# Patient Record
Sex: Female | Born: 1955 | Race: White | Hispanic: No | Marital: Married | Smoking: Former smoker
Health system: Southern US, Community
[De-identification: ages and names within clinical notes are randomized; demographics above are authoritative.]

## PROBLEM LIST (undated history)

## (undated) DIAGNOSIS — M545 Low back pain: Secondary | ICD-10-CM

## (undated) DIAGNOSIS — IMO0002 Reserved for concepts with insufficient information to code with codable children: Secondary | ICD-10-CM

## (undated) DIAGNOSIS — F329 Major depressive disorder, single episode, unspecified: Secondary | ICD-10-CM

## (undated) DIAGNOSIS — M14679 Charcot's joint, unspecified ankle and foot: Secondary | ICD-10-CM

## (undated) DIAGNOSIS — G43909 Migraine, unspecified, not intractable, without status migrainosus: Secondary | ICD-10-CM

## (undated) DIAGNOSIS — M1990 Unspecified osteoarthritis, unspecified site: Secondary | ICD-10-CM

## (undated) DIAGNOSIS — R42 Dizziness and giddiness: Secondary | ICD-10-CM

## (undated) DIAGNOSIS — Z9889 Other specified postprocedural states: Secondary | ICD-10-CM

## (undated) DIAGNOSIS — T8859XA Other complications of anesthesia, initial encounter: Secondary | ICD-10-CM

## (undated) DIAGNOSIS — R112 Nausea with vomiting, unspecified: Secondary | ICD-10-CM

## (undated) DIAGNOSIS — G8929 Other chronic pain: Secondary | ICD-10-CM

## (undated) DIAGNOSIS — K589 Irritable bowel syndrome without diarrhea: Secondary | ICD-10-CM

## (undated) DIAGNOSIS — R5383 Other fatigue: Secondary | ICD-10-CM

## (undated) DIAGNOSIS — M797 Fibromyalgia: Secondary | ICD-10-CM

## (undated) DIAGNOSIS — F32A Depression, unspecified: Secondary | ICD-10-CM

## (undated) DIAGNOSIS — R0989 Other specified symptoms and signs involving the circulatory and respiratory systems: Secondary | ICD-10-CM

## (undated) DIAGNOSIS — R609 Edema, unspecified: Secondary | ICD-10-CM

## (undated) DIAGNOSIS — G709 Myoneural disorder, unspecified: Secondary | ICD-10-CM

## (undated) DIAGNOSIS — T7840XA Allergy, unspecified, initial encounter: Secondary | ICD-10-CM

## (undated) DIAGNOSIS — T4145XA Adverse effect of unspecified anesthetic, initial encounter: Secondary | ICD-10-CM

## (undated) DIAGNOSIS — I8390 Asymptomatic varicose veins of unspecified lower extremity: Secondary | ICD-10-CM

## (undated) DIAGNOSIS — E119 Type 2 diabetes mellitus without complications: Secondary | ICD-10-CM

## (undated) DIAGNOSIS — K219 Gastro-esophageal reflux disease without esophagitis: Secondary | ICD-10-CM

## (undated) HISTORY — PX: EYE SURGERY: SHX253

## (undated) HISTORY — PX: CHOLECYSTECTOMY: SHX55

## (undated) HISTORY — DX: Unspecified osteoarthritis, unspecified site: M19.90

## (undated) HISTORY — PX: BUNIONECTOMY: SHX129

## (undated) HISTORY — DX: Other specified symptoms and signs involving the circulatory and respiratory systems: R09.89

## (undated) HISTORY — DX: Myoneural disorder, unspecified: G70.9

## (undated) HISTORY — DX: Reserved for concepts with insufficient information to code with codable children: IMO0002

## (undated) HISTORY — DX: Other fatigue: R53.83

## (undated) HISTORY — DX: Depression, unspecified: F32.A

## (undated) HISTORY — DX: Irritable bowel syndrome, unspecified: K58.9

## (undated) HISTORY — PX: RETINAL DETACHMENT SURGERY: SHX105

## (undated) HISTORY — DX: Dizziness and giddiness: R42

## (undated) HISTORY — DX: Edema, unspecified: R60.9

## (undated) HISTORY — PX: VARICOSE VEIN SURGERY: SHX832

## (undated) HISTORY — DX: Gastro-esophageal reflux disease without esophagitis: K21.9

## (undated) HISTORY — DX: Major depressive disorder, single episode, unspecified: F32.9

## (undated) HISTORY — DX: Allergy, unspecified, initial encounter: T78.40XA

---

## 1994-06-06 HISTORY — PX: TUBAL LIGATION: SHX77

## 2003-04-30 ENCOUNTER — Emergency Department (HOSPITAL_COMMUNITY): Admission: EM | Admit: 2003-04-30 | Discharge: 2003-05-01 | Payer: Self-pay | Admitting: Emergency Medicine

## 2013-03-14 ENCOUNTER — Ambulatory Visit (INDEPENDENT_AMBULATORY_CARE_PROVIDER_SITE_OTHER): Payer: Medicare Other

## 2013-03-14 ENCOUNTER — Encounter (INDEPENDENT_AMBULATORY_CARE_PROVIDER_SITE_OTHER): Payer: Self-pay

## 2013-03-14 VITALS — BP 154/72 | HR 86 | Temp 98.1°F | Resp 18 | Ht 68.0 in | Wt 211.0 lb

## 2013-03-14 DIAGNOSIS — L97412 Non-pressure chronic ulcer of right heel and midfoot with fat layer exposed: Secondary | ICD-10-CM

## 2013-03-14 DIAGNOSIS — L97409 Non-pressure chronic ulcer of unspecified heel and midfoot with unspecified severity: Secondary | ICD-10-CM

## 2013-03-14 DIAGNOSIS — E1142 Type 2 diabetes mellitus with diabetic polyneuropathy: Secondary | ICD-10-CM

## 2013-03-14 DIAGNOSIS — E1149 Type 2 diabetes mellitus with other diabetic neurological complication: Secondary | ICD-10-CM

## 2013-03-14 DIAGNOSIS — L02619 Cutaneous abscess of unspecified foot: Secondary | ICD-10-CM

## 2013-03-14 NOTE — Patient Instructions (Signed)
Diabetes and Foot Care Diabetes may cause you to have a poor blood supply (circulation) to your legs and feet. Because of this, the skin may be thinner, break easier, and heal more slowly. You also may have nerve damage in your legs and feet causing decreased feeling. You may not notice minor injuries to your feet that could lead to serious problems or infections. Taking care of your feet is one of the most important things you can do for yourself.  HOME CARE INSTRUCTIONS Do not go barefoot. Bare feet are easily injured. Check your feet daily for blisters, cuts, and redness. Wash your feet with warm water (not hot) and mild soap. Pat your feet and between your toes until completely dry. Apply a moisturizing lotion that does not contain alcohol or petroleum jelly to the dry skin on your feet and to dry brittle toenails. Do not put it between your toes. Trim your toenails straight across. Do not dig under them or around the cuticle. Do not cut corns or calluses, or try to remove them with medicine. Wear clean cotton socks or stockings every day. Make sure they are not too tight. Do not wear knee high stockings since they may decrease blood flow to your legs. Wear leather shoes that fit properly and have enough cushioning. To break in new shoes, wear them just a few hours a day to avoid injuring your feet. Wear shoes at all times, even in the house. Do not cross your legs. This may decrease the blood flow to your feet. If you find a minor scrape, cut, or break in the skin on your feet, keep it and the skin around it clean and dry. These areas may be cleansed with mild soap and water. Do not use peroxide, alcohol, iodine or Merthiolate. When you remove an adhesive bandage, be sure not to harm the skin around it. If you have a wound, look at it several times a day to make sure it is healing. Do not use heating pads or hot water bottles. Burns can occur. If you have lost feeling in your feet or legs, you  may not know it is happening until it is too late. Report any cuts, sores or bruises to your caregiver. Do not wait! SEEK MEDICAL CARE IF:  You have an injury that is not healing or you notice redness, numbness, burning, or tingling. Your feet always feel cold. You have pain or cramps in your legs and feet. SEEK IMMEDIATE MEDICAL CARE IF:  There is increasing redness, swelling, or increasing pain in the wound. There is a red line that goes up your leg. Pus is coming from a wound. You develop an unexplained oral temperature above 102 F (38.9 C), or as your caregiver suggests. You notice a bad smell coming from an ulcer or wound. MAKE SURE YOU:  Understand these instructions. Will watch your condition. Will get help right away if you are not doing well or get worse. Document Released: 05/20/2000 Document Revised: 08/15/2011 Document Reviewed: 11/26/2008 [facility] Patient Information 2014 [place], [place]. Skin Ulcer A skin ulcer is an open sore that can be shallow or deep. Skin ulcers sometimes become infected and are difficult to treat. It may be 1 month or longer before real healing progress is made. CAUSES   Injury.  Problems with the veins or arteries.  Diabetes.  Insect bites.  Bedsores.  Inflammatory conditions. SYMPTOMS   Pain, redness, swelling, and tenderness around the ulcer.  Fever.  Bleeding from the ulcer.  Yellow or clear fluid coming from the ulcer. DIAGNOSIS  There are many types of skin ulcers. Any open sores will be examined. Certain tests will be done to determine the kind of ulcer you have. The right treatment depends on the type of ulcer you have. TREATMENT  Treatment is a long-term challenge. It may include:  Wearing an elastic wrap, compression stockings, or gel cast over the ulcer area.  Taking antibiotic medicines or putting antibiotic creams on the affected area if there is an infection. HOME CARE INSTRUCTIONS  Put on your bandages  (dressings), wraps, or casts over the ulcer as directed by your caregiver.  Change all dressings as directed by your caregiver.  Take all medicines as directed by your caregiver.  Keep the affected area clean and dry.  Avoid injuries to the affected area.  Eat a well-balanced, healthy diet that includes plenty of fruit and vegetables.  If you smoke, consider quitting or decreasing the amount of cigarettes you smoke.  Once the ulcer heals, get regular exercise as directed by your caregiver.  Work with your caregiver to make sure your blood pressure, cholesterol, and diabetes are well-controlled.  Keep your skin moisturized. Dry skin can crack and lead to skin ulcers. SEEK IMMEDIATE MEDICAL CARE IF:   Your pain gets worse.  You have swelling, redness, or fluids around the ulcer.  You have chills.  You have a fever. MAKE SURE YOU:   Understand these instructions.  Will watch your condition.  Will get help right away if you are not doing well or get worse. Document Released: 06/30/2004 Document Revised: 08/15/2011 Document Reviewed: 01/07/2011 [facility] Patient Information 2014 [place], [place].
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
## 2013-03-28 ENCOUNTER — Ambulatory Visit (INDEPENDENT_AMBULATORY_CARE_PROVIDER_SITE_OTHER): Payer: Medicare Other

## 2013-03-28 ENCOUNTER — Ambulatory Visit: Payer: Medicare Other

## 2013-03-28 VITALS — BP 180/100 | HR 92 | Temp 98.0°F | Resp 20

## 2013-03-28 DIAGNOSIS — E1149 Type 2 diabetes mellitus with other diabetic neurological complication: Secondary | ICD-10-CM

## 2013-03-28 DIAGNOSIS — L97409 Non-pressure chronic ulcer of unspecified heel and midfoot with unspecified severity: Secondary | ICD-10-CM

## 2013-03-28 DIAGNOSIS — L97412 Non-pressure chronic ulcer of right heel and midfoot with fat layer exposed: Secondary | ICD-10-CM

## 2013-03-28 DIAGNOSIS — E1142 Type 2 diabetes mellitus with diabetic polyneuropathy: Secondary | ICD-10-CM

## 2013-03-28 NOTE — Progress Notes (Signed)
  Subjective:    Patient ID: Leisure centre manager, female    DOB: 1955/11/02, 57 y.o.   MRN: [id number] "It's getting better, not there yet but better than it was.  It's still draining."  HPI Patient needs to ulceration sub-midfoot right. Still proximally 1 x 1.5 cm overall diameter with surrounding macerated keratoses   Review of Systems deferred     Objective:   Physical Exam No changes in medication her health history. Vascular status unchanged dorsalis pedis +2/4 bilateral PT pulses nonpalpable +1 edema bilateral. To break sensations diminished on Semmes Weinstein testing. Mild serous drainage noted in the ulcer site no ascending cellulitis or purulence. Patient is afebrile the ulcer appears to be improving steadily. At this time the ulcer is debrided Iodosorb and gauze dressing are applied       Assessment & Plan:  Improving diabetic neuropathic ulceration plantar right midfoot dispensed and Curasol hydrogel dressing material. Patient has been using bacitracin Clent Ridges it between Curasol bacitracin as needed. Offload and reduce activities as much as possible. Recheck in 2 weeks for further followup  Alvan Dame DPM

## 2013-03-28 NOTE — Patient Instructions (Signed)
Instructions for Wound Care  The most important step to healing a foot wound is to reduce the pressure on your foot - it is extremely important to stay off your foot as much as possible and wear the shoe/boot as instructed.  Cleanse your foot with saline wash or warm soapy water (dial antibacterial soap or similar).  Blot dry.  Apply prescribed medication to your wound and cover with gauze and a bandage.  May hold bandage in place with Coban (self sticky wrap), Ace bandage or tape.  You may find dressing supplies at your local [employer], [employer], drug store or medical supply store.  Your prescribed topical medication is :  Amerigel (twice daily)  [facility] [employer] is a mail order medical supply company that we use to provide some of our would care products.  If we use their service of you, you will receive the product by mail.  If you have not received the medication in 3 business days, please call our office.  If you notice any foul odor, increase in pain, pus, increased swelling, red streaks or generalized redness occurring in your foot or leg-Call our office immediately to be seen.  This may be a sign of a limb or life threatening infection that will need prompt attention.  Alvan Dame, [facility]  [facility]  [phone] [place]  [phone] [facility]

## 2013-04-11 ENCOUNTER — Ambulatory Visit (INDEPENDENT_AMBULATORY_CARE_PROVIDER_SITE_OTHER): Payer: Medicare Other

## 2013-04-11 VITALS — BP 113/74 | HR 96 | Temp 97.9°F | Resp 16

## 2013-04-11 DIAGNOSIS — L97412 Non-pressure chronic ulcer of right heel and midfoot with fat layer exposed: Secondary | ICD-10-CM

## 2013-04-11 DIAGNOSIS — L97409 Non-pressure chronic ulcer of unspecified heel and midfoot with unspecified severity: Secondary | ICD-10-CM

## 2013-04-11 DIAGNOSIS — L02619 Cutaneous abscess of unspecified foot: Secondary | ICD-10-CM

## 2013-04-11 DIAGNOSIS — E1142 Type 2 diabetes mellitus with diabetic polyneuropathy: Secondary | ICD-10-CM

## 2013-04-11 DIAGNOSIS — M86179 Other acute osteomyelitis, unspecified ankle and foot: Secondary | ICD-10-CM

## 2013-04-11 DIAGNOSIS — E1149 Type 2 diabetes mellitus with other diabetic neurological complication: Secondary | ICD-10-CM

## 2013-04-11 MED ORDER — "KENDALL HYDROGEL GAUZE 2""X2"" EX PADS": 1.0000 | MEDICATED_PAD | Freq: Every morning | CUTANEOUS | Status: DC

## 2013-04-11 MED ORDER — CIPROFLOXACIN HCL 500 MG PO TABS: 500.0000 mg | ORAL_TABLET | Freq: Two times a day (BID) | ORAL | Status: DC

## 2013-04-11 NOTE — Progress Notes (Signed)
Subjective:    Patient ID: Leisure centre manager, female    DOB: February 09, 1956, 57 y.o.   MRN: [id number]  HPI patient went to ER yesterday due to redness and calf of leg was hot and sore and spasms and cramps and they did bloodwork and ultrasound and CT scan and x-ray and I was there for about 7 hours and they didn't see anything but did state to do a MRI    Review of Systems  Constitutional: Positive for chills.  Respiratory: Negative.   Skin: Positive for wound.  Hematological: Negative.   All other systems reviewed and are negative.       Objective:   Physical Exam  Vitals reviewed. Constitutional: She is oriented to person, place, and time. She appears well-developed and well-nourished.  Cardiovascular:  Pulses:      Dorsalis pedis pulses are 1+ on the right side, and 2+ on the left side.       Posterior tibial pulses are 1+ on the right side, and 1+ on the left side.  Capillary refill time 3 seconds all digits. There is significant edema and erythema the right foot mid arch area with some possible mild cellulitis around the plantar ulcer. Definite increase in warmth from previous visit. Patient did have some calf pain was seen in the hospital your did have some studies which were noted to be negative, apparently ultrasounds were done and be negative  Musculoskeletal:  Patient does have rigid contractures of toes edema enlargement right foot with Charcot-like changes midtarsal foot right. There is open ulceration sub-midfoot  Neurological: She is alert and oriented to person, place, and time. She has normal strength.  Epicritic and proprioceptive sensations grossly diminished on [facility] testing evaluation. Normal plantar response is noted DTRs absent on the Achilles tendon.  Skin: Skin is warm and dry. No cyanosis. Nails show no clubbing.  Skin color pigment normal there is erythema right foot and leg with localized cellulitis and ulcer proximally 2 x 3 cm sub-mid tarsus right  foot the ulcer goes down to deep capsule possibly even to bone at the area of Lisfranc's mid tarsus area. Hospital had taken x-rays which were noted to be unremarkable there was recommendation for MRI followup.  Psychiatric: She has a normal mood and affect. Her behavior is normal.          Assessment & Plan:  Diabetes with peripheral neuropathy history of ulcerations in the past. Recent ulceration was in treat with clindamycin and last several days return for the worse with fever and chills increased discharge drainage. Patient was seen in the ER the other day was administered IV vancomycin has had improvement in overall feeling however continues to have edema erythema with serous discharge drainage from the plantar right foot ulcer. There is necrosis of the central portion the ulcer which is debrided this time the ulcer does probe down to deep capsule. No new x-rays taken apparently Hospital x-rays were negative for osteomyelitis however recommendation was for MRI followup. Patient also. CT scanning done which was negative. Ulcer is debrided this time Dr. deep capsular structures Iodosorb and gauze dressing is applied to help dry the wound. Prescription for hydrogel dressings is given at this time patient also given prescriptions to start Cipro and will continue with her clindamycin. Clindamycin 150 3 times a day. Cipro 500 twice a day. We'll arrange for an MRI to be done as soon as possible right foot to rule out osteomyelitis. Recheck in one week for followup will  do daily dressing changes as instructed. Also to offload as much as possible. Should make note patient is been going up the bar to take care of her horses and animals. Foot has possibly gotten wet or contaminated leading to infection at last evaluations culture obtained on 03/05/2013 was positive for Serratia marcescens and group A strep patient was maintained on clindamycin. Followup in 1 week from now. Advised to contact us immediately if  any fever chills or occur or any case ascending cellulitis were to recur again next  Alvan Dame DP

## 2013-04-11 NOTE — Patient Instructions (Signed)
Diabetes and Foot Care Diabetes may cause you to have problems because of poor blood supply (circulation) to your feet and legs. This may cause the skin on your feet to become thinner, break easier, and heal more slowly. Your skin may become dry, and the skin may peel and crack. You may also have nerve damage in your legs and feet causing decreased feeling in them. You may not notice minor injuries to your feet that could lead to infections or more serious problems. Taking care of your feet is one of the most important things you can do for yourself.  HOME CARE INSTRUCTIONS  Wear shoes at all times, even in the house. Do not go barefoot. Bare feet are easily injured.  Check your feet daily for blisters, cuts, and redness. If you cannot see the bottom of your feet, use a mirror or ask someone for help.  Wash your feet with warm water (do not use hot water) and mild soap. Then pat your feet and the areas between your toes until they are completely dry. Do not soak your feet as this can dry your skin.  Apply a moisturizing lotion or petroleum jelly (that does not contain alcohol and is unscented) to the skin on your feet and to dry, brittle toenails. Do not apply lotion between your toes.  Trim your toenails straight across. Do not dig under them or around the cuticle. File the edges of your nails with an emery board or nail file.  Do not cut corns or calluses or try to remove them with medicine.  Wear clean socks or stockings every day. Make sure they are not too tight. Do not wear knee-high stockings since they may decrease blood flow to your legs.  Wear shoes that fit properly and have enough cushioning. To break in new shoes, wear them for just a few hours a day. This prevents you from injuring your feet. Always look in your shoes before you put them on to be sure there are no objects inside.  Do not cross your legs. This may decrease the blood flow to your feet.  If you find a minor scrape,  cut, or break in the skin on your feet, keep it and the skin around it clean and dry. These areas may be cleansed with mild soap and water. Do not cleanse the area with peroxide, alcohol, or iodine.  When you remove an adhesive bandage, be sure not to damage the skin around it.  If you have a wound, look at it several times a day to make sure it is healing.  Do not use heating pads or hot water bottles. They may burn your skin. If you have lost feeling in your feet or legs, you may not know it is happening until it is too late.  Make sure your health care provider performs a complete foot exam at least annually or more often if you have foot problems. Report any cuts, sores, or bruises to your health care provider immediately. SEEK MEDICAL CARE IF:   You have an injury that is not healing.  You have cuts or breaks in the skin.  You have an ingrown nail.  You notice redness on your legs or feet.  You feel burning or tingling in your legs or feet.  You have pain or cramps in your legs and feet.  Your legs or feet are numb.  Your feet always feel cold. SEEK IMMEDIATE MEDICAL CARE IF:   There is increasing redness,   swelling, or pain in or around a wound.  There is a red line that goes up your leg.  Pus is coming from a wound.  You develop a fever or as directed by your health care provider.  You notice a bad smell coming from an ulcer or wound. Document Released: 05/20/2000 Document Revised: 01/23/2013 Document Reviewed: 10/30/2012 [employer] Patient Information 2014 [employer]  

## 2013-04-18 ENCOUNTER — Ambulatory Visit (INDEPENDENT_AMBULATORY_CARE_PROVIDER_SITE_OTHER): Payer: Medicare Other

## 2013-04-18 ENCOUNTER — Other Ambulatory Visit: Payer: Self-pay

## 2013-04-18 VITALS — BP 144/82 | HR 89 | Resp 18

## 2013-04-18 DIAGNOSIS — M86179 Other acute osteomyelitis, unspecified ankle and foot: Secondary | ICD-10-CM

## 2013-04-18 DIAGNOSIS — L97409 Non-pressure chronic ulcer of unspecified heel and midfoot with unspecified severity: Secondary | ICD-10-CM

## 2013-04-18 DIAGNOSIS — L97412 Non-pressure chronic ulcer of right heel and midfoot with fat layer exposed: Secondary | ICD-10-CM

## 2013-04-18 DIAGNOSIS — E1142 Type 2 diabetes mellitus with diabetic polyneuropathy: Secondary | ICD-10-CM

## 2013-04-18 DIAGNOSIS — E1149 Type 2 diabetes mellitus with other diabetic neurological complication: Secondary | ICD-10-CM

## 2013-04-18 DIAGNOSIS — L02619 Cutaneous abscess of unspecified foot: Secondary | ICD-10-CM

## 2013-04-18 MED ORDER — CLINDAMYCIN HCL 150 MG PO CAPS: 150.0000 mg | ORAL_CAPSULE | Freq: Three times a day (TID) | ORAL | Status: DC

## 2013-04-18 NOTE — Patient Instructions (Signed)
Diabetes and Foot Care Diabetes may cause you to have problems because of poor blood supply (circulation) to your feet and legs. This may cause the skin on your feet to become thinner, break easier, and heal more slowly. Your skin may become dry, and the skin may peel and crack. You may also have nerve damage in your legs and feet causing decreased feeling in them. You may not notice minor injuries to your feet that could lead to infections or more serious problems. Taking care of your feet is one of the most important things you can do for yourself.  HOME CARE INSTRUCTIONS  Wear shoes at all times, even in the house. Do not go barefoot. Bare feet are easily injured.  Check your feet daily for blisters, cuts, and redness. If you cannot see the bottom of your feet, use a mirror or ask someone for help.  Wash your feet with warm water (do not use hot water) and mild soap. Then pat your feet and the areas between your toes until they are completely dry. Do not soak your feet as this can dry your skin.  Apply a moisturizing lotion or petroleum jelly (that does not contain alcohol and is unscented) to the skin on your feet and to dry, brittle toenails. Do not apply lotion between your toes.  Trim your toenails straight across. Do not dig under them or around the cuticle. File the edges of your nails with an emery board or nail file.  Do not cut corns or calluses or try to remove them with medicine.  Wear clean socks or stockings every day. Make sure they are not too tight. Do not wear knee-high stockings since they may decrease blood flow to your legs.  Wear shoes that fit properly and have enough cushioning. To break in new shoes, wear them for just a few hours a day. This prevents you from injuring your feet. Always look in your shoes before you put them on to be sure there are no objects inside.  Do not cross your legs. This may decrease the blood flow to your feet.  If you find a minor scrape,  cut, or break in the skin on your feet, keep it and the skin around it clean and dry. These areas may be cleansed with mild soap and water. Do not cleanse the area with peroxide, alcohol, or iodine.  When you remove an adhesive bandage, be sure not to damage the skin around it.  If you have a wound, look at it several times a day to make sure it is healing.  Do not use heating pads or hot water bottles. They may burn your skin. If you have lost feeling in your feet or legs, you may not know it is happening until it is too late.  Make sure your health care provider performs a complete foot exam at least annually or more often if you have foot problems. Report any cuts, sores, or bruises to your health care provider immediately. SEEK MEDICAL CARE IF:   You have an injury that is not healing.  You have cuts or breaks in the skin.  You have an ingrown nail.  You notice redness on your legs or feet.  You feel burning or tingling in your legs or feet.  You have pain or cramps in your legs and feet.  Your legs or feet are numb.  Your feet always feel cold. SEEK IMMEDIATE MEDICAL CARE IF:   There is increasing redness,   swelling, or pain in or around a wound.  There is a red line that goes up your leg.  Pus is coming from a wound.  You develop a fever or as directed by your health care provider.  You notice a bad smell coming from an ulcer or wound. Document Released: 05/20/2000 Document Revised: 01/23/2013 Document Reviewed: 10/30/2012 [employer] Patient Information 2014 [employer]  

## 2013-04-18 NOTE — Progress Notes (Signed)
  Subjective:    Patient ID: Leisure centre manager, female    DOB: 10-02-1955, 57 y.o.   MRN: [id number]  HPImy right  foot still drains and my ankle is swollen and the big toe hurts as well Patient presents this time for followup of Charcot-type changes of right foot with ulceration mid tarsus area the right foot. Patient continues to have serous discharge drainage and he had pain and swelling of the foot and right leg. Patient is having an MRI done at noon today. Apparently it was scheduled for earlier this week however nobody called her with her appointment and scheduled time.   Review of Systems  Constitutional: Positive for fatigue.  HENT: Negative.   Respiratory: Negative.   Cardiovascular: Negative.   Gastrointestinal: Negative.   Endocrine: Negative.   Genitourinary: Negative.   Musculoskeletal: Positive for myalgias.  Skin: Negative.   Allergic/Immunologic: Negative.   Neurological: Positive for numbness.  Hematological: Negative.   Psychiatric/Behavioral: Negative.        Objective:   Physical Exam  Vitals reviewed. Constitutional: She is oriented to person, place, and time. She appears well-developed and well-nourished.  Cardiovascular:  Pulses:      Dorsalis pedis pulses are 1+ on the right side, and 2+ on the left side.       Posterior tibial pulses are 1+ on the right side, and 1+ on the left side.  Capillary refill to refill time 3 seconds still significant edema on the right foot and ankle and leg  Musculoskeletal:  No new changes Charcot-type changes of the foot with widening and splaying of the right midfoot and forefoot appear digital contractures and history HAV deformity to  Neurological: She is alert and oriented to person, place, and time. She has normal strength.  Epicritic and proprioceptive sensations grossly diminished DTRs absent bilateral plantar response normal  Skin: Skin is warm and dry. No cyanosis. Nails show no clubbing.  Continued ulceration 2 x 3  cm above mid tarsus of Lisfranc's midtarsal area plantar medial right arch down to deep subcutaneous tissue probes down likely to capsule and not necessary to bone at this time it does go past the subcutaneous tissue level a deep culture swabs obtained at this time  Psychiatric: She has a normal mood and affect. Her behavior is normal.          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 Patient is going to have her MRI done later today to rule out possibility of osteomyelitis or deep abscess. At this time advised to reduce activities as much as possible I still have a concern that she is going outside and working in the barn around her animals. Patient be recheck again in one week for followup contact us is any changes or exacerbations fever or chills. May consider a referral to Dr. Bennett Scrape at [facility] for possible reconstruction options. May also consider referral to the wound center for other treatment options as well. May want to discuss options such as a wound VAC etc. however with patient's activity levels these will likely not be beneficial if she continues to walk on this area. Patient be seen in one week for followup  Alvan Dame DPM

## 2013-04-21 LAB — CULTURE, ROUTINE-ABSCESS

## 2013-04-22 ENCOUNTER — Telehealth: Payer: Self-pay | Admitting: *Deleted

## 2013-04-25 ENCOUNTER — Ambulatory Visit (INDEPENDENT_AMBULATORY_CARE_PROVIDER_SITE_OTHER): Payer: Medicare Other

## 2013-04-25 VITALS — BP 133/76 | HR 92 | Resp 18

## 2013-04-25 DIAGNOSIS — B351 Tinea unguium: Secondary | ICD-10-CM

## 2013-04-25 DIAGNOSIS — L02619 Cutaneous abscess of unspecified foot: Secondary | ICD-10-CM

## 2013-04-25 DIAGNOSIS — E1142 Type 2 diabetes mellitus with diabetic polyneuropathy: Secondary | ICD-10-CM

## 2013-04-25 DIAGNOSIS — L97409 Non-pressure chronic ulcer of unspecified heel and midfoot with unspecified severity: Secondary | ICD-10-CM

## 2013-04-25 DIAGNOSIS — E1149 Type 2 diabetes mellitus with other diabetic neurological complication: Secondary | ICD-10-CM

## 2013-04-25 DIAGNOSIS — M79609 Pain in unspecified limb: Secondary | ICD-10-CM

## 2013-04-25 DIAGNOSIS — L97412 Non-pressure chronic ulcer of right heel and midfoot with fat layer exposed: Secondary | ICD-10-CM

## 2013-04-25 NOTE — Progress Notes (Signed)
  Subjective:    Patient ID: Leisure centre manager, female    DOB: [birth year], 56 y.o.   MRN: [id number]  HPImy foot hurts and also my leg and draining and it is red in color and i just need some of my nails trimmed    Review of Systems no new changes or additions to findings.     Objective:   Physical Exam Neurovascular status intact and unchanged pedal pulses DP and PT plus one over 4 bilateral dorsalis pedis on the left is +2/4 patient Refill time 3 seconds all digits epicritic and proprioceptive sensations significantly diminished on Semmes Weinstein testing and plantar foot and digits. Neurologically has thick brittle crumbly friable criptotic nails with keratosis incurvation patient requesting nail debridement along times a day them trimmed are cutting into her socks and difficulty with the nails patient also has multiple keratoses plantarly which we addressed the future date as well the ulceration sub-mid tarsus area of foot beneath the first met base and cuneiform site continues to be a greater than 1.5 x 1.0 cm in diameter with approximately 1 cm depth. There is necrotic tissue in the base of the ulceration cultures obtained previously were positive forStenotrophomonas multiphilia.. This organism is sensitive to quinolones or Levaquin and likely ciprofloxacin. Patient advised to maintain on her Cipro and clindamycin antibiotic regimen. There is significant changes of the foot with edema and Charcot change of the foot still persistent. No ascending cellulitis noted patient's temperature in the leg and foot. Be more stable at this visit.       Assessment & Plan:  Assessment Charcot changes with ulceration right midfoot. Cultures obtained indicated need to maintain Cipro and clindamycin the ulcer is debrided of necrotic tissue and Iodosorb and gauze dressing are applied patient will maintain hydrogel dressings which were prescribed for her and last visit. Patient will also do an adjuvant therapy  utilizing a day to his type solution or chlorine bleach half a cup pads or gallop water for soaking her cleansing the foot were released once daily or every other day prior to dressing changes. Patient will also try to avoid contact with outside which is taken out or working with her horses she does wear or bruits however this is possibly likely site of contamination. The gram-negative infection is likely associated with a puncture wound a piece of glass a puncture to her foot and shoe. Will continue monitoring recheck in 2 weeks for followup. Contact us immediately or go to the emergency room if any fever chills persist or symptoms exacerbate.  Alvan Dame DP

## 2013-04-25 NOTE — Patient Instructions (Signed)
ANTIBACTERIAL SOAP INSTRUCTIONS  THE DAY AFTER PROCEDURE  Please follow the instructions your doctor has marked.   Shower as usual. Before getting out, place a drop of antibacterial liquid soap (Dial) on a wet, clean washcloth.  Gently wipe washcloth over affected area.  Afterward, rinse the area with warm water.  Blot the area dry with a soft cloth and cover with antibiotic ointment (neosporin, polysporin, bacitracin) and band aid or gauze and tape  Place 3-4 drops of antibacterial liquid soap in a quart of warm tap water.  Submerge foot into water for 20 minutes.  If bandage was applied after your procedure, leave on to allow for easy lift off, then remove and continue with soak for the remaining time.  Next, blot area dry with a soft cloth and cover with a bandage.  Apply other medications as directed by your doctor, such as cortisporin otic solution (eardrops) or neosporin antibiotic ointment. Utilize hydrogel dressings as instructed  Due to soaking her cleansing the foot once daily with a soapy solution however and 1 12:45 half a cup of bleach/chlorine bleach to the 1 gallon of the solution and performed the soak. Rinse thoroughly before doing a redressing as instructed

## 2013-04-26 ENCOUNTER — Telehealth: Payer: Self-pay | Admitting: *Deleted

## 2013-04-26 MED ORDER — CLINDAMYCIN HCL 150 MG PO CAPS: 150.0000 mg | ORAL_CAPSULE | Freq: Three times a day (TID) | ORAL | Status: DC

## 2013-04-26 NOTE — Telephone Encounter (Addendum)
Pt states the pharmacy has two diffterent strengths of Clindamycin 150 mg and 300 mg, which do you want her to fill?  Dr Ralene Cork states refill Clindamycin 150mg .

## 2013-05-09 ENCOUNTER — Inpatient Hospital Stay (HOSPITAL_COMMUNITY)
Admission: EM | Admit: 2013-05-09 | Discharge: 2013-05-13 | DRG: 854 | Disposition: A | Discharge status: Rehabilitation Facility | Payer: Medicare Other | Attending: Internal Medicine | Admitting: Internal Medicine

## 2013-05-09 ENCOUNTER — Ambulatory Visit (INDEPENDENT_AMBULATORY_CARE_PROVIDER_SITE_OTHER): Payer: Medicare Other

## 2013-05-09 ENCOUNTER — Emergency Department (HOSPITAL_COMMUNITY): Payer: Medicare Other

## 2013-05-09 ENCOUNTER — Encounter (HOSPITAL_COMMUNITY): Payer: Self-pay | Admitting: Emergency Medicine

## 2013-05-09 VITALS — BP 96/72 | HR 63 | Temp 101.4°F | Resp 18

## 2013-05-09 DIAGNOSIS — IMO0002 Reserved for concepts with insufficient information to code with codable children: Secondary | ICD-10-CM | POA: Diagnosis present

## 2013-05-09 DIAGNOSIS — M129 Arthropathy, unspecified: Secondary | ICD-10-CM | POA: Diagnosis present

## 2013-05-09 DIAGNOSIS — E1049 Type 1 diabetes mellitus with other diabetic neurological complication: Secondary | ICD-10-CM | POA: Diagnosis present

## 2013-05-09 DIAGNOSIS — Z794 Long term (current) use of insulin: Secondary | ICD-10-CM

## 2013-05-09 DIAGNOSIS — E11628 Type 2 diabetes mellitus with other skin complications: Secondary | ICD-10-CM | POA: Diagnosis present

## 2013-05-09 DIAGNOSIS — M79609 Pain in unspecified limb: Secondary | ICD-10-CM

## 2013-05-09 DIAGNOSIS — L97412 Non-pressure chronic ulcer of right heel and midfoot with fat layer exposed: Secondary | ICD-10-CM

## 2013-05-09 DIAGNOSIS — F32A Depression, unspecified: Secondary | ICD-10-CM

## 2013-05-09 DIAGNOSIS — L97409 Non-pressure chronic ulcer of unspecified heel and midfoot with unspecified severity: Secondary | ICD-10-CM

## 2013-05-09 DIAGNOSIS — E1149 Type 2 diabetes mellitus with other diabetic neurological complication: Secondary | ICD-10-CM

## 2013-05-09 DIAGNOSIS — Z87891 Personal history of nicotine dependence: Secondary | ICD-10-CM

## 2013-05-09 DIAGNOSIS — Z7982 Long term (current) use of aspirin: Secondary | ICD-10-CM

## 2013-05-09 DIAGNOSIS — I798 Other disorders of arteries, arterioles and capillaries in diseases classified elsewhere: Secondary | ICD-10-CM | POA: Diagnosis present

## 2013-05-09 DIAGNOSIS — E1169 Type 2 diabetes mellitus with other specified complication: Secondary | ICD-10-CM

## 2013-05-09 DIAGNOSIS — L97509 Non-pressure chronic ulcer of other part of unspecified foot with unspecified severity: Secondary | ICD-10-CM | POA: Diagnosis present

## 2013-05-09 DIAGNOSIS — F3289 Other specified depressive episodes: Secondary | ICD-10-CM | POA: Diagnosis present

## 2013-05-09 DIAGNOSIS — M869 Osteomyelitis, unspecified: Secondary | ICD-10-CM | POA: Diagnosis present

## 2013-05-09 DIAGNOSIS — E1065 Type 1 diabetes mellitus with hyperglycemia: Secondary | ICD-10-CM | POA: Diagnosis present

## 2013-05-09 DIAGNOSIS — I10 Essential (primary) hypertension: Secondary | ICD-10-CM | POA: Diagnosis present

## 2013-05-09 DIAGNOSIS — L089 Local infection of the skin and subcutaneous tissue, unspecified: Secondary | ICD-10-CM

## 2013-05-09 DIAGNOSIS — F329 Major depressive disorder, single episode, unspecified: Secondary | ICD-10-CM

## 2013-05-09 DIAGNOSIS — Z880 Allergy status to penicillin: Secondary | ICD-10-CM

## 2013-05-09 DIAGNOSIS — M9080 Osteopathy in diseases classified elsewhere, unspecified site: Secondary | ICD-10-CM | POA: Diagnosis present

## 2013-05-09 DIAGNOSIS — L02619 Cutaneous abscess of unspecified foot: Secondary | ICD-10-CM

## 2013-05-09 DIAGNOSIS — E785 Hyperlipidemia, unspecified: Secondary | ICD-10-CM | POA: Diagnosis present

## 2013-05-09 DIAGNOSIS — E1059 Type 1 diabetes mellitus with other circulatory complications: Secondary | ICD-10-CM | POA: Diagnosis present

## 2013-05-09 DIAGNOSIS — L0291 Cutaneous abscess, unspecified: Secondary | ICD-10-CM

## 2013-05-09 DIAGNOSIS — E1142 Type 2 diabetes mellitus with diabetic polyneuropathy: Secondary | ICD-10-CM | POA: Diagnosis present

## 2013-05-09 DIAGNOSIS — K219 Gastro-esophageal reflux disease without esophagitis: Secondary | ICD-10-CM | POA: Diagnosis present

## 2013-05-09 DIAGNOSIS — A419 Sepsis, unspecified organism: Secondary | ICD-10-CM | POA: Diagnosis present

## 2013-05-09 DIAGNOSIS — L0390 Cellulitis, unspecified: Secondary | ICD-10-CM

## 2013-05-09 DIAGNOSIS — E11621 Type 2 diabetes mellitus with foot ulcer: Secondary | ICD-10-CM

## 2013-05-09 HISTORY — DX: Fibromyalgia: M79.7

## 2013-05-09 HISTORY — DX: Adverse effect of unspecified anesthetic, initial encounter: T41.45XA

## 2013-05-09 HISTORY — DX: Other complications of anesthesia, initial encounter: T88.59XA

## 2013-05-09 HISTORY — DX: Asymptomatic varicose veins of unspecified lower extremity: I83.90

## 2013-05-09 HISTORY — DX: Low back pain: M54.5

## 2013-05-09 HISTORY — DX: Nausea with vomiting, unspecified: R11.2

## 2013-05-09 HISTORY — DX: Other chronic pain: G89.29

## 2013-05-09 HISTORY — DX: Type 2 diabetes mellitus without complications: E11.9

## 2013-05-09 HISTORY — DX: Charcot's joint, unspecified ankle and foot: M14.679

## 2013-05-09 HISTORY — DX: Other specified postprocedural states: Z98.890

## 2013-05-09 HISTORY — DX: Migraine, unspecified, not intractable, without status migrainosus: G43.909

## 2013-05-09 LAB — CBC
HCT: 35.8 % — ABNORMAL LOW (ref 36.0–46.0)
Hemoglobin: 12.4 g/dL (ref 12.0–15.0)
MCH: 29.4 pg (ref 26.0–34.0)
MCHC: 34.6 g/dL (ref 30.0–36.0)
MCV: 84.8 fL (ref 78.0–100.0)
Platelets: 211 10*3/uL (ref 150–400)
RBC: 4.22 MIL/uL (ref 3.87–5.11)
RDW: 12.4 % (ref 11.5–15.5)
WBC: 14.8 10*3/uL — ABNORMAL HIGH (ref 4.0–10.5)

## 2013-05-09 LAB — COMPREHENSIVE METABOLIC PANEL
AST: 13 U/L (ref 0–37)
Albumin: 3.2 g/dL — ABNORMAL LOW (ref 3.5–5.2)
BUN: 9 mg/dL (ref 6–23)
Calcium: 8.7 mg/dL (ref 8.4–10.5)
Creatinine, Ser: 0.53 mg/dL (ref 0.50–1.10)
GFR calc Af Amer: >90 mL/min (ref >90)
GFR calc non Af Amer: >90 mL/min (ref >90)
Glucose, Bld: 495 mg/dL — ABNORMAL HIGH (ref 70–99)
Potassium: 4.5 mEq/L (ref 3.5–5.1)
Total Protein: 7.3 g/dL (ref 6.0–8.3)

## 2013-05-09 LAB — GLUCOSE, CAPILLARY: Glucose-Capillary: 357 mg/dL — ABNORMAL HIGH (ref 70–99)

## 2013-05-09 MED ORDER — ONDANSETRON HCL 4 MG/2ML IJ SOLN
4.0000 mg | Freq: Once | INTRAMUSCULAR | Status: AC
  Administered 2013-05-09: 4 mg via INTRAVENOUS
  Filled 2013-05-09: qty 2

## 2013-05-09 MED ORDER — SODIUM CHLORIDE 0.9 % IV SOLN
INTRAVENOUS | Status: AC
  Administered 2013-05-09: 21:00:00 via INTRAVENOUS

## 2013-05-09 MED ORDER — INSULIN GLARGINE 100 UNIT/ML ~~LOC~~ SOLN
24.0000 [IU] | Freq: Every day | SUBCUTANEOUS | Status: DC
  Administered 2013-05-09: 21:00:00 24 [IU] via SUBCUTANEOUS
  Filled 2013-05-09 (×2): qty 0.24

## 2013-05-09 MED ORDER — DIPHENHYDRAMINE HCL 50 MG/ML IJ SOLN
INTRAMUSCULAR | Status: AC
  Administered 2013-05-09: 12.5 mg via INTRAMUSCULAR
  Filled 2013-05-09: qty 1

## 2013-05-09 MED ORDER — ASPIRIN 81 MG PO TABS: 324.0000 mg | ORAL_TABLET | Freq: Every day | ORAL | Status: DC

## 2013-05-09 MED ORDER — INSULIN ASPART 100 UNIT/ML ~~LOC~~ SOLN
0.0000 [IU] | SUBCUTANEOUS | Status: DC
Start: 2013-05-09 — End: 2013-05-11
  Administered 2013-05-09: 21:00:00 15 [IU] via SUBCUTANEOUS
  Administered 2013-05-10: 5 [IU] via SUBCUTANEOUS
  Administered 2013-05-10: 17:00:00 8 [IU] via SUBCUTANEOUS
  Administered 2013-05-10: 05:00:00 3 [IU] via SUBCUTANEOUS
  Administered 2013-05-10: 11 [IU] via SUBCUTANEOUS
  Administered 2013-05-10: 13:00:00 5 [IU] via SUBCUTANEOUS
  Administered 2013-05-10 – 2013-05-11 (×2): 8 [IU] via SUBCUTANEOUS

## 2013-05-09 MED ORDER — DIPHENHYDRAMINE HCL 50 MG/ML IJ SOLN
12.5000 mg | Freq: Once | INTRAMUSCULAR | Status: AC
  Administered 2013-05-09: 12.5 mg via INTRAMUSCULAR

## 2013-05-09 MED ORDER — VANCOMYCIN HCL IN DEXTROSE 1-5 GM/200ML-% IV SOLN
1000.0000 mg | Freq: Three times a day (TID) | INTRAVENOUS | Status: AC
  Administered 2013-05-09 – 2013-05-13 (×12): 1000 mg via INTRAVENOUS
  Filled 2013-05-09 (×11): qty 200

## 2013-05-09 MED ORDER — DIAZEPAM 2 MG PO TABS
2.0000 mg | ORAL_TABLET | Freq: Four times a day (QID) | ORAL | Status: DC | PRN
  Administered 2013-05-10 – 2013-05-12 (×2): 2 mg via ORAL
  Filled 2013-05-09 (×2): qty 1

## 2013-05-09 MED ORDER — MORPHINE SULFATE 4 MG/ML IJ SOLN
4.0000 mg | Freq: Once | INTRAMUSCULAR | Status: AC
  Administered 2013-05-09: 4 mg via INTRAVENOUS
  Filled 2013-05-09: qty 1

## 2013-05-09 MED ORDER — MORPHINE SULFATE 2 MG/ML IJ SOLN: 2.0000 mg | INTRAMUSCULAR | Status: DC | PRN

## 2013-05-09 MED ORDER — ACETAMINOPHEN 325 MG PO TABS
650.0000 mg | ORAL_TABLET | Freq: Once | ORAL | Status: AC
  Administered 2013-05-09: 650 mg via ORAL
  Filled 2013-05-09: qty 2

## 2013-05-09 MED ORDER — ASPIRIN 81 MG PO CHEW
324.0000 mg | CHEWABLE_TABLET | Freq: Every day | ORAL | Status: DC
  Administered 2013-05-10 – 2013-05-13 (×3): 324 mg via ORAL
  Filled 2013-05-09 (×4): qty 4

## 2013-05-09 MED ORDER — VANCOMYCIN HCL IN DEXTROSE 1-5 GM/200ML-% IV SOLN
1000.0000 mg | Freq: Once | INTRAVENOUS | Status: AC
  Administered 2013-05-09: 1000 mg via INTRAVENOUS
  Filled 2013-05-09: qty 200

## 2013-05-09 MED ORDER — DEXTROSE 5 % IV SOLN
1.0000 g | Freq: Three times a day (TID) | INTRAVENOUS | Status: AC
  Administered 2013-05-09 – 2013-05-13 (×10): 1 g via INTRAVENOUS
  Filled 2013-05-09 (×11): qty 1

## 2013-05-09 MED ORDER — HEPARIN SODIUM (PORCINE) 5000 UNIT/ML IJ SOLN
5000.0000 [IU] | Freq: Three times a day (TID) | INTRAMUSCULAR | Status: DC
  Administered 2013-05-09 – 2013-05-13 (×11): 5000 [IU] via SUBCUTANEOUS
  Filled 2013-05-09 (×14): qty 1

## 2013-05-09 MED ORDER — ACETAMINOPHEN 325 MG PO TABS
650.0000 mg | ORAL_TABLET | Freq: Four times a day (QID) | ORAL | Status: DC | PRN
  Administered 2013-05-10 (×2): 650 mg via ORAL
  Filled 2013-05-09 (×2): qty 2

## 2013-05-09 MED ORDER — ACETAMINOPHEN 650 MG RE SUPP: 650.0000 mg | Freq: Four times a day (QID) | RECTAL | Status: DC | PRN

## 2013-05-09 MED ORDER — TRAMADOL HCL 50 MG PO TBDP: 50.0000 mg | ORAL_TABLET | Freq: Four times a day (QID) | ORAL | Status: DC | PRN

## 2013-05-09 MED ORDER — TRAMADOL HCL 50 MG PO TABS
50.0000 mg | ORAL_TABLET | Freq: Four times a day (QID) | ORAL | Status: DC | PRN
  Administered 2013-05-10 (×2): 50 mg via ORAL
  Filled 2013-05-09 (×2): qty 1

## 2013-05-09 MED ORDER — SODIUM CHLORIDE 0.9 % IV BOLUS (SEPSIS)
1000.0000 mL | Freq: Once | INTRAVENOUS | Status: AC
  Administered 2013-05-09: 1000 mL via INTRAVENOUS

## 2013-05-09 NOTE — ED Notes (Signed)
Patient transported to X-ray 

## 2013-05-09 NOTE — Progress Notes (Signed)
   Subjective:    Patient ID: Leisure centre manager, female    DOB: Aug 30, 1955, 57 y.o.   MRN: [id number]  HPI patient presents this time for followup ulceration right foot. On presentation patient is in severe distress patient has severe fever and chills temperature 101.4 her blood pressure somewhat low at 96/72 pulse and respiration are unremarkable. Patient is having severe pain in her right foot and leg. Patient indicates that she's been having fever and chills for the past 4 days. Patient was seen by her primary physician yesterday and cold that she could not be admitted by primary physician. That her podiatrist would have to admit her to the hospital.    Review of Systems deferred at this time. Should note patient is in distress breathing rapidly with fever and chills     Objective:   Physical Exam Vascular status unchanged patient has severe edema and increased temperature the right lower extremity. The previous 1.5 cm ulcer in the central area of the right midfoot has now expanded with surrounding necrosis. There are several areas both proximal and distal that show fluctuance and ecchymosis consistent with likely spreading of abscess and infection along the entire plantar surface. The ulcers been draining purulent and bloody drainage. Patient has been changing dressings 2 or 3 times daily. Patient did not go to the emergency room as instructed because she stated they would just send her home anyways. At this time the wound is cleansed with all cleansed or and a large Kerlix wrap is applied to the right foot patient is already ambulating in a Darco shoe utilizing a cane. Patient has severe pain on palpation and ambulation.       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 At this time patient has been maintained on antibiotic  regimen including Cipro and clindamycin. However the foot has not responded to these medications. Patient states she's not been active in the last couple of days except her visit to Dr. Burgess Estelle. Patient is advised to immediately go to the emergency room at [facility]. Our office a call ahead and notify ER staff of her condition and need for admission. Patient was advised the hospital will likely assign her a hospitalist and possibly orthopedic surgeon for likely need incision and drainage/and possible amputation. Patient was advised there is a high likelihood that she may lose her foot at this time. Patient states she's ready to get it off because it is so painful. Patient left the office accompanied by a friend or family member to drive her to the hospital. Office will contact your in advance of her arrival. We'll followup with ER physician/hospitalist later this evening.  Alvan Dame DPM

## 2013-05-09 NOTE — ED Notes (Signed)
Pt has been seeing PCP for wound in bottom of R foot since September started after she stepped onto a piece of glass. Pt has been on several oral abx and the wound is not getting better. States she has been unable to get her blood sugar under control because of the infection. States her PCP sent her today for further management since the oral abx arent working

## 2013-05-09 NOTE — Progress Notes (Signed)
° °  Subjective:    Patient ID: Leisure centre manager, female    DOB: 1955/09/26, 57 y.o.   MRN: [id number]  HPI Sunday had a fever and took aspirin and tylenol and goes down then it goes right back up and went to my doctor yesterday and there is nothing that she can do and your foot doctor has to put you in hospital and I have not been on my foot at all since Thursday    Review of Systems     Objective:   Physical Exam        Assessment & Plan:

## 2013-05-09 NOTE — Patient Instructions (Signed)
Go immediately to the ER, at [facility].

## 2013-05-09 NOTE — ED Provider Notes (Addendum)
CSN: [id number]     Arrival date & time 05/09/13  1447 History   First MD Initiated Contact with Patient 05/09/13 1632     Chief Complaint  Patient presents with  . Recurrent Skin Infections   (Consider location/radiation/quality/duration/timing/severity/associated sxs/prior Treatment) HPI Comments: Pt presenting today with worsening diabetic foot ulcer.  She initially got the ulcer after stepping on a piece of glass in sept and not improving.  She is currently on cipro/clinda and for the last 1 week she has had worsening swelling, erythema and pain with fever for the last few days of over 101.  She also states her blood sugar has been difficult to control.  The history is provided by the patient.    Past Medical History  Diagnosis Date  . Diabetes mellitus without complication   . Dizziness   . Fatigue   . Poor circulation   . Swelling   . Depression   . Arthritis   . Allergy     sinus problems  . IBS (irritable bowel syndrome)   . Cataract   . GERD (gastroesophageal reflux disease)   . Hyperlipidemia   . Neuromuscular disorder   . Ulcer    Past Surgical History  Procedure Laterality Date  . Cesarean section    . Bunionectomy Right   . Gallbladder surgery    . Eye surgery     Family History  Problem Relation Age of Onset  . Cancer Father    History  Substance Use Topics  . Smoking status: Former Games developer  . Smokeless tobacco: Never Used  . Alcohol Use: No   OB History   Grav Para Term Preterm Abortions TAB SAB Ect Mult Living                 Review of Systems  Constitutional: Positive for fever.  Respiratory: Negative for cough and shortness of breath.   Gastrointestinal: Negative for nausea, vomiting and diarrhea.  Neurological: Negative for weakness.  All other systems reviewed and are negative.    Allergies  Codeine; Darvocet; Demerol; Ibuprofen; Iodine; Naproxen; Penicillins; Sulfa antibiotics; Other; and Tape  Home Medications   Current  Outpatient Rx  Name  Route  Sig  Dispense  Refill  . aspirin 81 MG tablet   Oral   Take 324 mg by mouth daily.         . ciprofloxacin (CIPRO) 500 MG tablet   Oral   Take 1 tablet (500 mg total) by mouth 2 (two) times daily.   20 tablet   2   . clindamycin (CLEOCIN) 150 MG capsule   Oral   Take 150 mg by mouth 3 (three) times daily. 04-26-13 for 30 days.         . diazepam (VALIUM) 2 MG tablet   Oral   Take 2 mg by mouth every 6 (six) hours as needed for anxiety.         . insulin glargine (LANTUS) 100 UNIT/ML injection   Subcutaneous   Inject 24 Units into the skin at bedtime.          . TraMADol HCl 50 MG TBDP   Oral   Take 50 mg by mouth every 6 (six) hours as needed (pain).          BP 146/65  Pulse 104  Temp(Src) 100.1 F (37.8 C) (Oral)  Resp 12  SpO2 95% Physical Exam  Nursing note and vitals reviewed. Constitutional: She is oriented to person, place, and time. She  appears well-developed and well-nourished. No distress.  HENT:  Head: Normocephalic and atraumatic.  Mouth/Throat: Oropharynx is clear and moist.  Eyes: Conjunctivae and EOM are normal. Pupils are equal, round, and reactive to light.  Neck: Normal range of motion. Neck supple.  Cardiovascular: Regular rhythm and intact distal pulses.  Tachycardia present.   No murmur heard. Pulmonary/Chest: Effort normal and breath sounds normal. No respiratory distress. She has no wheezes. She has no rales.  Abdominal: Soft. She exhibits no distension. There is no tenderness. There is no rebound and no guarding.  Musculoskeletal: Normal range of motion. She exhibits no edema and no tenderness.       Feet:  Generalized right foot swelling.  No erythema in the lower leg  Neurological: She is alert and oriented to person, place, and time.  Skin: Skin is warm and dry. No rash noted. No erythema.  Psychiatric: She has a normal mood and affect. Her behavior is normal.    ED Course  Procedures (including  critical care time) Labs Review Labs Reviewed  COMPREHENSIVE METABOLIC PANEL - Abnormal; Notable for the following:    Sodium 131 (*)    Chloride 94 (*)    Glucose, Bld 495 (*)    Albumin 3.2 (*)    All other components within normal limits  CBC - Abnormal; Notable for the following:    WBC 14.8 (*)    HCT 35.8 (*)    All other components within normal limits  GLUCOSE, CAPILLARY - Abnormal; Notable for the following:    Glucose-Capillary 489 (*)    All other components within normal limits   Imaging Review Dg Foot Complete Right  05/09/2013   CLINICAL DATA:  Recurrent skin infection with ulcer on the plantar surface of the right foot.  EXAM: RIGHT FOOT COMPLETE - 3+ VIEW  COMPARISON:  MRI of foot April 18, 2013  FINDINGS: There is no evidence of fracture or dislocation. There are degenerative joint changes of the midfoot. There is plantar calcaneal spur. Postsurgical changes are identified in the distal 1st metatarsal. There are extensive soft tissue swelling and air in the plantar surface of the foot. There is no bony destruction to suggest osteomyelitis.  IMPRESSION: Marked soft tissue swelling and air in the plantar surface of the right foot consistent with infection. No bony destruction to suggest osteomyelitis.   Electronically Signed   By: Sherian Rein M.D.   On: 05/09/2013 18:12    EKG Interpretation   None       MDM   1. Diabetic foot ulcer   2. Cellulitis     Patient presenting with worsening diabetic foot wound.  Patient has been taking antibiotics since September and currently is on clindamycin and Cipro however over the last week her wound has worsened and now she has a new area with erythema and pain on the bottom of her foot. She has a leukocytosis of 14,000 a fever of 101.4 and hyperglycemia. She states she's had a hard time controlling her blood sugars in the last week since the wound has worsened. She seldom care today and they sent her here for further care.  Negative for signs of osteomyelitis. Deep with some foul-smelling drainage but no purulence. Patient started on vancomycin and will admit for further care    Gwyneth Sprout, MD 05/09/13 4098  Gwyneth Sprout, MD 05/09/13 1191

## 2013-05-09 NOTE — Progress Notes (Signed)
Called and received report from [place] in ED.

## 2013-05-09 NOTE — H&P (Signed)
[facility] History and Physical  Nancy Narciso AVW:[id number] DOB: Oct 01, 1955 DOA: 05/09/2013  Referring physician:  PCP: Bufford Spikes, NP  Specialists:   Chief Complaint: Foot ulcer  HPI: [name] [name] is a 57 y.o. female  With a history of uncontrolled diabetes, depression that presents to the emergency department for foot ulcer. Patient states her she had previously he stepped on a piece of glass back in September 2014. She has been seeing wound care. She was placed on Cipro and clindamycin for approximately the last week. However she's noticed that her swelling has worsened as well as her erythema and pain. Patient also had a fever the last few days over 101. She also states her blood sugars have not been well-controlled, and have been ranging in the 200s. Patient is extremely reluctant on using insulin was recently started on Lantus by her primary care practitioner.  Review of Systems:  Constitutional: Denies  chills, diaphoresis, appetite change and fatigue.  complains of fever. HEENT: Denies photophobia, eye pain, redness, hearing loss, ear pain, congestion, sore throat, rhinorrhea, sneezing, mouth sores, trouble swallowing, neck pain, neck stiffness and tinnitus.   Respiratory: Denies SOB, DOE, cough, chest tightness,  and wheezing.   Cardiovascular: Denies chest pain, palpitations and leg swelling.  Gastrointestinal: Denies nausea, vomiting, abdominal pain, diarrhea, constipation, blood in stool and abdominal distention.  Genitourinary: Denies dysuria, urgency, frequency, hematuria, flank pain and difficulty urinating.  Musculoskeletal: Denies myalgias, back pain, joint swelling, arthralgias and gait problem.  Skin: Denies pallor, rash. Wounds on the right foot. Neurological: Denies dizziness, seizures, syncope, weakness, light-headedness, numbness and headaches.  Hematological: Denies adenopathy. Easy bruising, personal or family bleeding history   Psychiatric/Behavioral: Denies suicidal ideation, mood changes, confusion, nervousness, sleep disturbance and agitation  Past Medical History  Diagnosis Date  . Diabetes mellitus without complication   . Dizziness   . Fatigue   . Poor circulation   . Swelling   . Depression   . Arthritis   . Allergy     sinus problems  . IBS (irritable bowel syndrome)   . Cataract   . GERD (gastroesophageal reflux disease)   . Hyperlipidemia   . Neuromuscular disorder   . Ulcer    Past Surgical History  Procedure Laterality Date  . Cesarean section    . Bunionectomy Right   . Gallbladder surgery    . Eye surgery     Social History:  reports that she has quit smoking. She has never used smokeless tobacco. She reports that she does not drink alcohol or use illicit drugs.  married lives at home with her husband.   Allergies  Allergen Reactions  . Codeine Anaphylaxis and Hives  . Darvocet [Propoxyphene-Acetaminophen] Anaphylaxis  . Demerol [Meperidine] Anaphylaxis  . Ibuprofen Anaphylaxis  . Iodine Anaphylaxis  . Naproxen Anaphylaxis and Hives  . Penicillins Anaphylaxis  . Sulfa Antibiotics Nausea And Vomiting  . Other     Bee stings and medication in the cain family  . Tape     Adhesive tapes    Family History  Problem Relation Age of Onset  . Cancer Father     Prior to Admission medications   Medication Sig Start Date End Date Taking? Authorizing Provider  aspirin 81 MG tablet Take 324 mg by mouth daily.   Yes Historical Provider, MD  ciprofloxacin (CIPRO) 500 MG tablet Take 1 tablet (500 mg total) by mouth 2 (two) times daily. 04/11/13  Yes Richard Ralene Cork, DPM  clindamycin (CLEOCIN) 150 MG capsule  Take 150 mg by mouth 3 (three) times daily. 04-26-13 for 30 days. 04/26/13  Yes Richard Ralene Cork, DPM  diazepam (VALIUM) 2 MG tablet Take 2 mg by mouth every 6 (six) hours as needed for anxiety.   Yes Historical Provider, MD  insulin glargine (LANTUS) 100 UNIT/ML injection Inject 24  Units into the skin at bedtime.    Yes Historical Provider, MD  TraMADol HCl 50 MG TBDP Take 50 mg by mouth every 6 (six) hours as needed (pain).   Yes Historical Provider, MD   Physical Exam: Filed Vitals:   05/09/13 1817  BP:   Pulse:   Temp: 100.1 F (37.8 C)  Resp:      General: Well developed, well nourished, NAD, appears stated age  HEENT: NCAT, PERRLA, EOMI, Anicteic Sclera, mucous membranes moist. No pharyngeal erythema or exudates  Neck: Supple, no JVD, no masses,  Cardiovascular: S1 S2 auscultated, 2/6 SEM. Regular rate and rhythm.  Respiratory: Clear to auscultation bilaterally with equal chest rise  Abdomen: Soft, nontender, nondistended, + bowel sounds  Extremities: warm dry without cyanosis clubbing.  2 ulcers noted on the medial and dorsal aspect of the right foot. Non-purulent drainage. Erythema and swelling noted.  Neuro: AAOx3, cranial nerves grossly intact.   Skin: Without rashes exudates or nodules  Psych: Depressed mood and affect, anxiety  Labs on Admission:  Basic Metabolic Panel:  Recent Labs Lab 05/09/13 1517  NA 131*  K 4.5  CL 94*  CO2 25  GLUCOSE 495*  BUN 9  CREATININE 0.53  CALCIUM 8.7   Liver Function Tests:  Recent Labs Lab 05/09/13 1517  AST 13  ALT 8  ALKPHOS 85  BILITOT 0.7  PROT 7.3  ALBUMIN 3.2*   No results found for this basename: LIPASE, AMYLASE,  in the last 168 hours No results found for this basename: AMMONIA,  in the last 168 hours CBC:  Recent Labs Lab 05/09/13 1517  WBC 14.8*  HGB 12.4  HCT 35.8*  MCV 84.8  PLT 211   Cardiac Enzymes: No results found for this basename: CKTOTAL, CKMB, CKMBINDEX, TROPONINI,  in the last 168 hours  BNP (last 3 results) No results found for this basename: PROBNP,  in the last 8760 hours CBG:  Recent Labs Lab 05/09/13 1516  GLUCAP 489*    Radiological Exams on Admission: Dg Foot Complete Right  05/09/2013   CLINICAL DATA:  Recurrent skin infection with  ulcer on the plantar surface of the right foot.  EXAM: RIGHT FOOT COMPLETE - 3+ VIEW  COMPARISON:  MRI of foot April 18, 2013  FINDINGS: There is no evidence of fracture or dislocation. There are degenerative joint changes of the midfoot. There is plantar calcaneal spur. Postsurgical changes are identified in the distal 1st metatarsal. There are extensive soft tissue swelling and air in the plantar surface of the foot. There is no bony destruction to suggest osteomyelitis.  IMPRESSION: Marked soft tissue swelling and air in the plantar surface of the right foot consistent with infection. No bony destruction to suggest osteomyelitis.   Electronically Signed   By: Sherian Rein M.D.   On: 05/09/2013 18:12    Assessment/Plan Active Problems:   Diabetic foot infection   Sepsis   Accelerated hypertension   Depression   Sepsis secondary to diabetic foot ulcer infection Patient will be admitted to the medical floor. Patient is currently afebrile has leukocytosis. Will place her on broad-spectrum antibiotics as well as obtain a wound consult. Patient has failed  outpatient antibiotics including clindamycin and ciprofloxacin. Will have pharmacy to dose and monitor vancomycin and aztreonam as patient has a penicillin allergy.  Imaging does not show any bone involvement at this time will continue to monitor. Will place patient on normal saline at 100 cc per hour for 10 hours.   Accelerated Hypertension likely secondary to pain Patient does not seem to be in any antihypertensives. Will continue to monitor and add on medications as needed.  Diabetes mellitus Will obtain a hemoglobin A1c. Continue patient's Lantus. Will also place her on sliding scale with CBG monitoring. Will consult diabetic education.  Patient is very reluctant in using sliding scale insulin.  She states "I would rather die than shoot myself up with insulin 5 times per day."  Depression Will continue Valium.  DVT prophylaxis:  Heparin  Code Status: Full  Condition: Guarded  Family Communication: Husband at bedside. Admission, patients condition and plan of care including tests being ordered have been discussed with the patient and husband who indicate understanding and agree with the plan and Code Status.  Disposition Plan: Admitted for approximately 2-3 days  Time spent: 45 minutes  Tyrease Vandeberg D.O. [facility] Pager [phone]  If 7PM-7AM, please contact night-coverage [email or web address] Password [facility] 05/09/2013, 6:54 PM

## 2013-05-09 NOTE — Progress Notes (Signed)
ANTIBIOTIC CONSULT NOTE - INITIAL  Pharmacy Consult for aztreonam and vancomycin Indication: sepsis from diabetic foot ulcer  Allergies  Allergen Reactions  . Codeine Anaphylaxis and Hives  . Darvocet [Propoxyphene-Acetaminophen] Anaphylaxis  . Demerol [Meperidine] Anaphylaxis  . Ibuprofen Anaphylaxis  . Iodine Anaphylaxis  . Naproxen Anaphylaxis and Hives  . Penicillins Anaphylaxis  . Sulfa Antibiotics Nausea And Vomiting  . Other     Bee stings and medication in the cain family  . Tape     Adhesive tapes    Patient Measurements: Height: 5\' 8"  (172.7 cm) Weight: 200 lb 2.8 oz (90.8 kg) IBW/kg (Calculated) : 63.9   Vital Signs: Temp: 99 F (37.2 C) (12/04 1943) Temp src: Oral (12/04 1943) BP: 159/81 mmHg (12/04 1943) Pulse Rate: 103 (12/04 1943) Intake/Output from previous day:   Intake/Output from this shift:    Labs:  Recent Labs  05/09/13 1517  WBC 14.8*  HGB 12.4  PLT 211  CREATININE 0.53   Estimated Creatinine Clearance: 92.6 ml/min (by C-G formula based on Cr of 0.53). No results found for this basename: VANCOTROUGH, Leodis Binet, VANCORANDOM, GENTTROUGH, GENTPEAK, GENTRANDOM, TOBRATROUGH, TOBRAPEAK, TOBRARND, AMIKACINPEAK, AMIKACINTROU, AMIKACIN,  in the last 72 hours   Microbiology: Recent Results (from the past 720 hour(s))  CULTURE, ROUTINE-ABSCESS     Status: None   Collection Time    04/18/13 11:15 AM      Result Value Range Status   Culture Abundant STENOTROPHOMONAS MALTOPHILIA   Final   Comment: R BALL OF FOOT   Gram Stain Few   Final   Gram Stain WBC present-both PMN and Mononuclear   Final   Gram Stain Rare Squamous Epithelial Cells Present   Final   Gram Stain Abundant GRAM POSITIVE COCCI IN PAIRS In Chains   Final   Gram Stain Moderate Gram Negative Rods   Final   Organism ID, Bacteria STENOTROPHOMONAS MALTOPHILIA   Final    Medical History: Past Medical History  Diagnosis Date  . Diabetes mellitus without complication   .  Dizziness   . Fatigue   . Poor circulation   . Swelling   . Depression   . Arthritis   . Allergy     sinus problems  . IBS (irritable bowel syndrome)   . Cataract   . GERD (gastroesophageal reflux disease)   . Hyperlipidemia   . Neuromuscular disorder   . Ulcer     Assessment: [name] [name] admitted with a diabetic fool ulcer. She was placed on ciprofloxacin and clindamycin PTA in the last week but swelling has worsened and she has been febrile. Pharmacy asked to dose vancomycin and aztreonam (anaphylaxis to penicillins). Imaging does not show bone involvement. Est CrCl ~40mL/min, WBC 14.8 and Tmax 101.4.  Goal of Therapy:  Vancomycin trough level 15-20 mcg/ml  Plan:  1. Vancomycin 1000mg  IV q8h 2. Aztreonam 1g IV q8h 3. Follow cultures/sensitivities, renal function, clinical progression, trough at [facility]  Arsh Feutz D. Synethia Endicott, PharmD, BCPS Clinical Pharmacist Pager: [phone] 05/09/2013 8:16 PM

## 2013-05-09 NOTE — ED Notes (Signed)
MD at bedside. 

## 2013-05-10 LAB — BASIC METABOLIC PANEL
Calcium: 8.1 mg/dL — ABNORMAL LOW (ref 8.4–10.5)
GFR calc Af Amer: >90 mL/min (ref >90)
GFR calc non Af Amer: >90 mL/min (ref >90)
Potassium: 3.4 mEq/L — ABNORMAL LOW (ref 3.5–5.1)
Sodium: 136 mEq/L (ref 135–145)

## 2013-05-10 LAB — GLUCOSE, CAPILLARY
Glucose-Capillary: 200 mg/dL — ABNORMAL HIGH (ref 70–99)
Glucose-Capillary: 240 mg/dL — ABNORMAL HIGH (ref 70–99)
Glucose-Capillary: 297 mg/dL — ABNORMAL HIGH (ref 70–99)
Glucose-Capillary: 307 mg/dL — ABNORMAL HIGH (ref 70–99)

## 2013-05-10 LAB — HEMOGLOBIN A1C
Hgb A1c MFr Bld: 10.8 % — ABNORMAL HIGH (ref <5.7)
Mean Plasma Glucose: 263 mg/dL — ABNORMAL HIGH (ref <117)

## 2013-05-10 LAB — CBC
Hemoglobin: 10.7 g/dL — ABNORMAL LOW (ref 12.0–15.0)
Platelets: 203 10*3/uL (ref 150–400)
RBC: 3.68 MIL/uL — ABNORMAL LOW (ref 3.87–5.11)
WBC: 11.3 10*3/uL — ABNORMAL HIGH (ref 4.0–10.5)

## 2013-05-10 MED ORDER — INSULIN GLARGINE 100 UNIT/ML ~~LOC~~ SOLN
20.0000 [IU] | Freq: Two times a day (BID) | SUBCUTANEOUS | Status: DC
  Administered 2013-05-10: 20 [IU] via SUBCUTANEOUS
  Filled 2013-05-10 (×3): qty 0.2

## 2013-05-10 MED ORDER — GLUCERNA SHAKE PO LIQD
237.0000 mL | Freq: Two times a day (BID) | ORAL | Status: DC
  Administered 2013-05-10 – 2013-05-13 (×5): 237 mL via ORAL

## 2013-05-10 MED ORDER — LISINOPRIL 10 MG PO TABS
10.0000 mg | ORAL_TABLET | Freq: Every day | ORAL | Status: DC
  Administered 2013-05-10 – 2013-05-13 (×3): 10 mg via ORAL
  Filled 2013-05-10 (×4): qty 1

## 2013-05-10 MED ORDER — INSULIN GLARGINE 100 UNIT/ML ~~LOC~~ SOLN
15.0000 [IU] | Freq: Two times a day (BID) | SUBCUTANEOUS | Status: DC
  Administered 2013-05-10: 15 [IU] via SUBCUTANEOUS
  Filled 2013-05-10 (×2): qty 0.15

## 2013-05-10 MED ORDER — INSULIN ASPART 100 UNIT/ML ~~LOC~~ SOLN
3.0000 [IU] | Freq: Three times a day (TID) | SUBCUTANEOUS | Status: DC
  Administered 2013-05-10 – 2013-05-11 (×3): 3 [IU] via SUBCUTANEOUS

## 2013-05-10 MED ORDER — POTASSIUM CHLORIDE CRYS ER 20 MEQ PO TBCR
40.0000 meq | EXTENDED_RELEASE_TABLET | Freq: Once | ORAL | Status: AC
  Administered 2013-05-10: 07:00:00 40 meq via ORAL
  Filled 2013-05-10: qty 2

## 2013-05-10 MED ORDER — CARVEDILOL 3.125 MG PO TABS
3.1250 mg | ORAL_TABLET | Freq: Two times a day (BID) | ORAL | Status: DC
  Administered 2013-05-10 – 2013-05-13 (×5): 3.125 mg via ORAL
  Filled 2013-05-10 (×8): qty 1

## 2013-05-10 NOTE — Care Management Note (Signed)
    Page 1 of 2   05/13/2013     11:25:54 AM   CARE MANAGEMENT NOTE 05/13/2013  Patient:  [name] [name], [name] [name]   Account Number:  [id number]  Date Initiated:  05/10/2013  Documentation initiated by:  Letha Cape  Subjective/Objective Assessment:   dx diabetic foot infection  admit- lives with spouse. pta indep.     Action/Plan:   pt eval- rec inpt rehab.   Anticipated DC Date:  05/13/2013   Anticipated DC Plan:  IP REHAB FACILITY  In-house referral  Clinical Social Worker      DC Planning Services  CM consult      PAC Choice  IP REHAB   Choice offered to / List presented to:             Status of service:  Completed, signed off Medicare Important Message given?   (If response is "NO", the following Medicare IM given date fields will be blank) Date Medicare IM given:   Date Additional Medicare IM given:    Discharge Disposition:  IP REHAB FACILITY  Per UR Regulation:  Reviewed for med. necessity/level of care/duration of stay  If discussed at Long Length of Stay Meetings, dates discussed:    Comments:  05/13/13 11:20 Letha Cape RN, BSN [phone] patient has been approved for inpt rehab, for dc today per Lelon Frohlich , MD informed and Charge RN informed.  05/10/13 15:28 Letha Cape RN, BSN [phone] patient lives with spouse, pta indep.  Patient states she does not have Medicare part D for medication coverage, NCM spoke with diabetic educator to see if patient would be able to take 70/30.  Diabetic educatior is speaking with patient and will see what patient would be able to take. Per Diabetic Educator, patient has 6 lantus pens at home which will get her through til January and she has an  apt with her PCP , Cathleen Corti on Jan 5.  Diabetic Educator informed patient that at her next apt with PCP , she need to inform her to change her to 70/30 because she will not be able to afford the Lantus.  Patient has six lantus pens to last her.
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
## 2013-05-10 NOTE — Consult Note (Signed)
WOC wound consult note Reason for Consult: right foot wounds Pt with insensate right foot. Hx of plantar foot ulcer x 1 since September, now with second ulceration. She is followed by a podiatrist and has been performing wound care at home. She mentioned silver dressings. Xray is neg for osteomyelitis, however I am concerned that MRI may be needed. She is very tearful during my exam.   She does have a warm extremity and palpable pulses, however she does have significant edema in the RLE and erythema of the foot  Wound type:neuropathic foot ulcers x 2. Measurement: mid foot plantar surface 2.0cm x 2.5cm x 0.5 cm.  Proximal/lateral foot 3.0cm x 3.5cm x 0.2cm  Wound bed:  Mid foot is fluctuant, with 100% yellow soft slough.  Proximal foot: partial thickness skin loss with dark maroon base, not eschar. Drainage (amount, consistency, odor)  Moderate serosanguinous, with mild odor Periwound: erythema  and maceration  Dressing procedure/placement/frequency: will only order dry dressing for now, needs ABI to determine blood flow and ortho evaluation for surgical debridement vs. Conservative bedside sharps debridement.  I have explained need for ABI and rationale for this and the need for orthopedic evaluation. She verbalized understanding, but still very tearful and concerned.    WOC will follow along and review after orthopedic evaluation for further needs.  Kaleb Sek [place], [place] [phone]

## 2013-05-10 NOTE — Progress Notes (Signed)
INITIAL NUTRITION ASSESSMENT  DOCUMENTATION CODES Per approved criteria  -Obesity Unspecified   INTERVENTION: - Glucerna Shake po BID, each supplement provides 220 kcal and 10 grams of protein. - Recommend adding multivitamin for wound healing  NUTRITION DIAGNOSIS: Altered nutrition-related lab values related to diabetes mellitus as evidenced by blood glucose 495 upon admission.  Inadequate oral intake related to diabetes mellitus as evidenced by wt loss.   Goal: Pt to meet >/= 90% of their estimated nutrition needs  Monitor:  Wt, po intake, labs, acceptance of supplements  Reason for Assessment: MST  57 y.o. female  Admitting Dx: <principal problem not specified>  ASSESSMENT: Pt with history of uncontrolled diabetes, depression that presents to the emergency department for foot ulcer. Patient states her she had previously he stepped on a piece of glass back in September 2014. She has been seeing wound care.  Pt reports that she has lost about 15-20 lbs in the past couple of months. She says that she hasn't felt like eating because of medications. She says that she does not want to eat anything, but RD encouraged her to try Glucerna Shakes to slow wt loss and promote wound healing.   Height: Ht Readings from Last 1 Encounters:  05/09/13 5\' 8"  (1.727 m)    Weight: Wt Readings from Last 1 Encounters:  05/09/13 200 lb 2.8 oz (90.8 kg)    Ideal Body Weight: 63.9 kg  % Ideal Body Weight: 142%  Wt Readings from Last 10 Encounters:  05/09/13 200 lb 2.8 oz (90.8 kg)  03/14/13 211 lb (95.709 kg)    Usual Body Weight: 215-220 lbs  % Usual Body Weight: 93%  BMI:  Body mass index is 30.44 kg/(m^2).  Estimated Nutritional Needs: Kcal: 2250-2500 Protein: 110-125 g/day Fluid: 2.3-2.5 L/day  Skin: diabetic ulcer on right foot  Diet Order: Carb Control  EDUCATION NEEDS: -No education needs identified at this time   Intake/Output Summary (Last 24 hours) at 05/10/13  1306 Last data filed at 05/10/13 0900  Gross per 24 hour  Intake    120 ml  Output      0 ml  Net    120 ml    Last BM: none recorded   Labs:   Recent Labs Lab 05/09/13 1517 05/10/13 0526  NA 131* 136  K 4.5 3.4*  CL 94* 101  CO2 25 26  BUN 9 9  CREATININE 0.53 0.49*  CALCIUM 8.7 8.1*  GLUCOSE 495* 236*    CBG (last 3)   Recent Labs  05/10/13 0449 05/10/13 0749 05/10/13 1129  GLUCAP 200* 282* 244*    Scheduled Meds: . aspirin  324 mg Oral Daily  . aztreonam  1 g Intravenous Q8H  . carvedilol  3.125 mg Oral BID WC  . heparin  5,000 Units Subcutaneous Q8H  . insulin aspart  0-15 Units Subcutaneous Q4H  . insulin aspart  3 Units Subcutaneous TID WC  . insulin glargine  15 Units Subcutaneous BID  . lisinopril  10 mg Oral Daily  . vancomycin  1,000 mg Intravenous Q8H    Continuous Infusions:   Past Medical History  Diagnosis Date  . Dizziness   . Fatigue   . Swelling   . Depression   . Allergy     sinus problems  . IBS (irritable bowel syndrome)   . GERD (gastroesophageal reflux disease)   . Neuromuscular disorder   . Ulcer   . Complication of anesthesia     "have a hard time  when I wake up" (05/09/2013)  . PONV (postoperative nausea and vomiting)   . Poor circulation   . Varicose vein of leg   . Type II diabetes mellitus   . Migraines     "hits me here and there" (05/09/2013)  . Arthritis     "both hands"   . Charcot's joint of foot     "both feet"   . Fibromyalgia   . Chronic lower back pain     "2 cracked vertebra; 3 herniated discs" (05/09/2013)    Past Surgical History  Procedure Laterality Date  . Cesarean section  1980; 1996  . Bunionectomy Right   . Cholecystectomy    . Tubal ligation  1996  . Retinal detachment surgery Left ~ 2012  . Varicose vein surgery Left ~ 2003    "it was full of blood clots" (05/09/2013)    [name] [name] RD, LDN

## 2013-05-10 NOTE — Progress Notes (Signed)
Utilization review completed. Arris Meyn, RN, BSN. 

## 2013-05-10 NOTE — Consult Note (Signed)
Reason for Consult: Abscess ulcer plantar right foot Referring Physician: Dr Philomena [name] [name] is an 57 y.o. female.  HPI: Patient is a 57 year old woman with diabetic neuropathy she states that she has been undergoing wound care by podiatry since September. She states that she is asked to be admitted to the hospital and continue with her conservative wound care. Patient presents at this time with ulceration abscess right foot  Past Medical History  Diagnosis Date  . Dizziness   . Fatigue   . Swelling   . Depression   . Allergy     sinus problems  . IBS (irritable bowel syndrome)   . GERD (gastroesophageal reflux disease)   . Neuromuscular disorder   . Ulcer   . Complication of anesthesia     "[name]" (05/09/2013)  . PONV (postoperative nausea and vomiting)   . Poor circulation   . Varicose vein of leg   . Type II diabetes mellitus   . Migraines     "hits me here and there" (05/09/2013)  . Arthritis     "both hands"   . Charcot's joint of foot     "both feet"   . Fibromyalgia   . Chronic lower back pain     "2 cracked vertebra; 3 herniated discs" (05/09/2013)    Past Surgical History  Procedure Laterality Date  . Cesarean section  1980; 1996  . Bunionectomy Right   . Cholecystectomy    . Tubal ligation  1996  . Retinal detachment surgery Left ~ 2012  . Varicose vein surgery Left ~ 2003    "it was full of blood clots" (05/09/2013)    Family History  Problem Relation Age of Onset  . Cancer Father     Social History:  reports that she has quit smoking. Her smoking use included Cigarettes. She has a 3.75 pack-year smoking history. She has never used smokeless tobacco. She reports that she does not drink alcohol or use illicit drugs.  Allergies:  Allergies  Allergen Reactions  . Codeine Anaphylaxis and Hives  . Darvocet [Propoxyphene-Acetaminophen] Anaphylaxis  . Demerol [Meperidine] Anaphylaxis  . Ibuprofen Anaphylaxis  . Iodine  Anaphylaxis  . Naproxen Anaphylaxis and Hives  . Penicillins Anaphylaxis  . Sulfa Antibiotics Nausea And Vomiting  . Other     Bee stings and medication in the cain family  . Tape     Adhesive tapes    Medications: I have reviewed the patient's current medications.  Results for orders placed during the hospital encounter of 05/09/13 (from the past 48 hour(s))  GLUCOSE, CAPILLARY     Status: Abnormal   Collection Time    05/09/13  3:16 PM      Result Value Range   Glucose-Capillary 489 (*) 70 - 99 mg/dL  COMPREHENSIVE METABOLIC PANEL     Status: Abnormal   Collection Time    05/09/13  3:17 PM      Result Value Range   Sodium 131 (*) 135 - 145 mEq/L   Potassium 4.5  3.5 - 5.1 mEq/L   Chloride 94 (*) 96 - 112 mEq/L   CO2 25  19 - 32 mEq/L   Glucose, Bld 495 (*) 70 - 99 mg/dL   BUN 9  6 - 23 mg/dL   Creatinine, Ser 2.13  0.50 - 1.10 mg/dL   Calcium 8.7  8.4 - 08.6 mg/dL   Total Protein 7.3  6.0 - 8.3 g/dL   Albumin 3.2 (*)  3.5 - 5.2 g/dL   AST 13  0 - 37 U/L   ALT 8  0 - 35 U/L   Alkaline Phosphatase 85  39 - 117 U/L   Total Bilirubin 0.7  0.3 - 1.2 mg/dL   GFR calc non Af Amer >90  >90 mL/min   GFR calc Af Amer >90  >90 mL/min   Comment: (NOTE)     The eGFR has been calculated using the CKD EPI equation.     This calculation has not been validated in all clinical situations.     eGFR's persistently <90 mL/min signify possible Chronic Kidney     Disease.  CBC     Status: Abnormal   Collection Time    05/09/13  3:17 PM      Result Value Range   WBC 14.8 (*) 4.0 - 10.5 K/uL   RBC 4.22  3.87 - 5.11 MIL/uL   Hemoglobin 12.4  12.0 - 15.0 g/dL   HCT 82.9 (*) 56.2 - 13.0 %   MCV 84.8  78.0 - 100.0 fL   MCH 29.4  26.0 - 34.0 pg   MCHC 34.6  30.0 - 36.0 g/dL   RDW 86.5  78.4 - 69.6 %   Platelets 211  150 - 400 K/uL  GLUCOSE, CAPILLARY     Status: Abnormal   Collection Time    05/09/13  8:47 PM      Result Value Range   Glucose-Capillary 357 (*) 70 - 99 mg/dL  GLUCOSE,  CAPILLARY     Status: Abnormal   Collection Time    05/10/13 12:04 AM      Result Value Range   Glucose-Capillary 307 (*) 70 - 99 mg/dL  GLUCOSE, CAPILLARY     Status: Abnormal   Collection Time    05/10/13  4:49 AM      Result Value Range   Glucose-Capillary 200 (*) 70 - 99 mg/dL  BASIC METABOLIC PANEL     Status: Abnormal   Collection Time    05/10/13  5:26 AM      Result Value Range   Sodium 136  135 - 145 mEq/L   Potassium 3.4 (*) 3.5 - 5.1 mEq/L   Comment: DELTA CHECK NOTED   Chloride 101  96 - 112 mEq/L   CO2 26  19 - 32 mEq/L   Glucose, Bld 236 (*) 70 - 99 mg/dL   BUN 9  6 - 23 mg/dL   Creatinine, Ser 2.95 (*) 0.50 - 1.10 mg/dL   Calcium 8.1 (*) 8.4 - 10.5 mg/dL   GFR calc non Af Amer >90  >90 mL/min   GFR calc Af Amer >90  >90 mL/min   Comment: (NOTE)     The eGFR has been calculated using the CKD EPI equation.     This calculation has not been validated in all clinical situations.     eGFR's persistently <90 mL/min signify possible Chronic Kidney     Disease.  CBC     Status: Abnormal   Collection Time    05/10/13  5:26 AM      Result Value Range   WBC 11.3 (*) 4.0 - 10.5 K/uL   RBC 3.68 (*) 3.87 - 5.11 MIL/uL   Hemoglobin 10.7 (*) 12.0 - 15.0 g/dL   HCT 28.4 (*) 13.2 - 44.0 %   MCV 84.0  78.0 - 100.0 fL   MCH 29.1  26.0 - 34.0 pg   MCHC 34.6  30.0 - 36.0 g/dL  RDW 12.6  11.5 - 15.5 %   Platelets 203  150 - 400 K/uL  HEMOGLOBIN A1C     Status: Abnormal   Collection Time    05/10/13  5:26 AM      Result Value Range   Hemoglobin A1C 10.8 (*) <5.7 %   Comment: (NOTE)                                                                               According to the ADA Clinical Practice Recommendations for 2011, when     HbA1c is used as a screening test:      >=6.5%   Diagnostic of Diabetes Mellitus               (if abnormal result is confirmed)     5.7-6.4%   Increased risk of developing Diabetes Mellitus     References:Diagnosis and Classification of  Diabetes Mellitus,Diabetes     Care,2011,34(Suppl 1):S62-S69 and Standards of Medical Care in             Diabetes - 2011,Diabetes Care,2011,34 (Suppl 1):S11-S61.   Mean Plasma Glucose 263 (*) <117 mg/dL   Comment: Performed at [facility]  GLUCOSE, CAPILLARY     Status: Abnormal   Collection Time    05/10/13  7:49 AM      Result Value Range   Glucose-Capillary 282 (*) 70 - 99 mg/dL  GLUCOSE, CAPILLARY     Status: Abnormal   Collection Time    05/10/13 11:29 AM      Result Value Range   Glucose-Capillary 244 (*) 70 - 99 mg/dL  GLUCOSE, CAPILLARY     Status: Abnormal   Collection Time    05/10/13  4:14 PM      Result Value Range   Glucose-Capillary 297 (*) 70 - 99 mg/dL    Dg Foot Complete Right  05/09/2013   CLINICAL DATA:  Recurrent skin infection with ulcer on the plantar surface of the right foot.  EXAM: RIGHT FOOT COMPLETE - 3+ VIEW  COMPARISON:  MRI of foot April 18, 2013  FINDINGS: There is no evidence of fracture or dislocation. There are degenerative joint changes of the midfoot. There is plantar calcaneal spur. Postsurgical changes are identified in the distal 1st metatarsal. There are extensive soft tissue swelling and air in the plantar surface of the foot. There is no bony destruction to suggest osteomyelitis.  IMPRESSION: Marked soft tissue swelling and air in the plantar surface of the right foot consistent with infection. No bony destruction to suggest osteomyelitis.   Electronically Signed   By: Sherian Rein M.D.   On: 05/09/2013 18:12    Review of Systems  All other systems reviewed and are negative.   Blood pressure 139/67, pulse 85, temperature 97.6 F (36.4 C), temperature source Oral, resp. rate 20, height 5\' 8"  (1.727 m), weight 90.8 kg (200 lb 2.8 oz), SpO2 98.00%. Physical Exam On examination patient has a palpable dorsalis pedis pulse. She has warmth and redness on the plantar aspect of her foot. There are 2 large ulcers in the plantar aspect of  her foot about 2 cm in diameter. One ulcer probes all way down  to bone beneath the metatarsals. The second ulcer is soft and necrotic with no drainage. Her radiographs are reviewed and shows the more proximal heel ulcer extends down to bone with air in the soft tissue down to the calcaneus. The ulcer beneath the metatarsal head shows no bony destruction. She does have some mild Charcot changes to her foot but no significant Charcot arthropathy. Review of the MRI scan from November does not show the ulcerative abscess beneath the calcaneus but does show the ulcer extending down to bone beneath the metatarsals. Patient states that this new ulcer beneath the calcaneus is new was not present at the time of her MRI scan. Assessment/Plan: Assessment: Osteomyelitis ulceration abscess right foot.  Plan: I do not feel that there are any foot salvage options for the patient. Recommended continued IV antibiotics would then plan to proceed with a transtibial amputation tomorrow morning. Anticipate patient will need discharge to skilled nursing facility.  [name] [name],[name] [name] 05/10/2013, 6:41 PM

## 2013-05-10 NOTE — Progress Notes (Signed)
[facility]                                                                                Patient Demographics  [name] [name], is a 57 y.o. female, DOB - Oct 04, 1955, ZOX:[id number]  Admit date - 05/09/2013   Admitting Physician Edsel Petrin, DO  Outpatient Primary MD for the patient is Bufford Spikes, NP  LOS - 1   Chief Complaint  Patient presents with  . Recurrent Skin Infections        Assessment & Plan    1. R.Foot Diabetic foot Ulcer with cellulits and Sepsis - failed outpatient antibiotic treatment, she does have poor glycemic control and does have diabetic neuropathy, x-ray foot so far has been stable. She is responding well to empiric antibiotics and currently is afebrile. Will order ABIs, requested orthopedic Dr. Lajoyce Corners to evaluate the patient. Likely will need prolonged IV antibiotics the PICC line.   2.HTN - replace accommodation of Coreg and lisinopril and monitor.    3.DM-2 - poor control, we'll adjust Levemir, adjust sliding scale and add pre-meal short-acting insulin for better control.  No results found for this basename: HGBA1C    CBG (last 3)   Recent Labs  05/10/13 0004 05/10/13 0449 05/10/13 0749  GLUCAP 307* 200* 282*       Depression  Will continue Valium. Not suicidal homicidal, outpatient psych followup.       Code Status: Full  Family Communication: none present  Disposition Plan: Home   Procedures X ray R. Foot   Consults  [facility], [facility]   Medications  Scheduled Meds: . aspirin  324 mg Oral Daily  . aztreonam  1 g Intravenous Q8H  . heparin  5,000 Units Subcutaneous Q8H  . insulin aspart  0-15 Units Subcutaneous Q4H  . insulin glargine  24 Units Subcutaneous QHS  . vancomycin  1,000 mg Intravenous Q8H   Continuous Infusions:  PRN Meds:.acetaminophen, acetaminophen, diazepam, traMADol  DVT Prophylaxis   Heparin   Lab Results  Component Value Date   PLT 203 05/10/2013    Antibiotics      Anti-infectives   Start     Dose/Rate Route Frequency Ordered Stop   05/09/13 2100  vancomycin (VANCOCIN) IVPB 1000 mg/200 mL premix     1,000 mg 200 mL/hr over 60 Minutes Intravenous Every 8 hours 05/09/13 2018     05/09/13 2100  aztreonam (AZACTAM) 1 g in dextrose 5 % 50 mL IVPB     1 g 100 mL/hr over 30 Minutes Intravenous 3 times per day 05/09/13 2018     05/09/13 1830  vancomycin (VANCOCIN) IVPB 1000 mg/200 mL premix     1,000 mg 200 mL/hr over 60 Minutes Intravenous  Once 05/09/13 1827 05/09/13 1936          Subjective:   Jonette Thivierge today has, No headache, No chest pain, No abdominal pain - No Nausea, No new weakness tingling or numbness, No Cough - SOB.    Objective:   Filed Vitals:   05/09/13 1817 05/09/13 1943 05/09/13 2009 05/10/13 0451  BP:  159/81  154/83  Pulse:  103  88  Temp: 100.1 F (37.8 C) 99  F (37.2 C)  98.6 F (37 C)  TempSrc: Oral Oral  Oral  Resp:  18  20  Height:   5\' 8"  (1.727 m)   Weight:   90.8 kg (200 lb 2.8 oz)   SpO2:  94%  96%    Wt Readings from Last 3 Encounters:  05/09/13 90.8 kg (200 lb 2.8 oz)  03/14/13 95.709 kg (211 lb)     Intake/Output Summary (Last 24 hours) at 05/10/13 1037 Last data filed at 05/10/13 0900  Gross per 24 hour  Intake    120 ml  Output      0 ml  Net    120 ml    Exam Awake Alert, Oriented X 3, No new F.N deficits, Normal affect [place].AT,PERRAL Supple Neck,No JVD, No cervical lymphadenopathy appriciated.  Symmetrical Chest wall movement, Good air movement bilaterally, CTAB RRR,No Gallops,Rubs or new Murmurs, No Parasternal Heave +ve B.Sounds, Abd Soft, Non tender, No organomegaly appriciated, No rebound - guarding or rigidity. No Cyanosis, Clubbing or edema, No new Rash or bruise  R.foot plantar aspect has 2 shallow ulcers with surrounding cellulitis, minimal pus like DC   Data Review   Micro Results No results found for this or any previous visit (from the past 240  hour(s)).  Radiology Reports Dg Foot Complete Right  05/09/2013   CLINICAL DATA:  Recurrent skin infection with ulcer on the plantar surface of the right foot.  EXAM: RIGHT FOOT COMPLETE - 3+ VIEW  COMPARISON:  MRI of foot April 18, 2013  FINDINGS: There is no evidence of fracture or dislocation. There are degenerative joint changes of the midfoot. There is plantar calcaneal spur. Postsurgical changes are identified in the distal 1st metatarsal. There are extensive soft tissue swelling and air in the plantar surface of the foot. There is no bony destruction to suggest osteomyelitis.  IMPRESSION: Marked soft tissue swelling and air in the plantar surface of the right foot consistent with infection. No bony destruction to suggest osteomyelitis.   Electronically Signed   By: Sherian Rein M.D.   On: 05/09/2013 18:12    CBC  Recent Labs Lab 05/09/13 1517 05/10/13 0526  WBC 14.8* 11.3*  HGB 12.4 10.7*  HCT 35.8* 30.9*  PLT 211 203  MCV 84.8 84.0  MCH 29.4 29.1  MCHC 34.6 34.6  RDW 12.4 12.6    Chemistries   Recent Labs Lab 05/09/13 1517 05/10/13 0526  NA 131* 136  K 4.5 3.4*  CL 94* 101  CO2 25 26  GLUCOSE 495* 236*  BUN 9 9  CREATININE 0.53 0.49*  CALCIUM 8.7 8.1*  AST 13  --   ALT 8  --   ALKPHOS 85  --   BILITOT 0.7  --    ------------------------------------------------------------------------------------------------------------------ estimated creatinine clearance is 92.6 ml/min (by C-G formula based on Cr of 0.49). ------------------------------------------------------------------------------------------------------------------ No results found for this basename: HGBA1C,  in the last 72 hours ------------------------------------------------------------------------------------------------------------------ No results found for this basename: CHOL, HDL, LDLCALC, TRIG, CHOLHDL, LDLDIRECT,  in the last 72  hours ------------------------------------------------------------------------------------------------------------------ No results found for this basename: TSH, T4TOTAL, FREET3, T3FREE, THYROIDAB,  in the last 72 hours ------------------------------------------------------------------------------------------------------------------ No results found for this basename: VITAMINB12, FOLATE, FERRITIN, TIBC, IRON, RETICCTPCT,  in the last 72 hours  Coagulation profile No results found for this basename: INR, PROTIME,  in the last 168 hours  No results found for this basename: DDIMER,  in the last 72 hours  Cardiac Enzymes No results found for this basename:  CK, CKMB, TROPONINI, MYOGLOBIN,  in the last 168 hours ------------------------------------------------------------------------------------------------------------------ No components found with this basename: POCBNP,      Time Spent in minutes   35   Carolyne Whitsel K M.D on 05/10/2013 at 10:37 AM  Between 7am to 7pm - Pager - [phone]  After 7pm go to [email or web address] - password [email or web address]  And look for the night coverage person covering for me after hours  [facility] Office  [phone]

## 2013-05-11 ENCOUNTER — Encounter (HOSPITAL_COMMUNITY)
Admission: EM | Disposition: A | Discharge status: Rehabilitation Facility | Payer: Self-pay | Source: Home / Self Care | Attending: Internal Medicine

## 2013-05-11 ENCOUNTER — Inpatient Hospital Stay (HOSPITAL_COMMUNITY): Payer: Medicare Other | Admitting: Anesthesiology

## 2013-05-11 ENCOUNTER — Encounter (HOSPITAL_COMMUNITY): Payer: Medicare Other | Admitting: Anesthesiology

## 2013-05-11 ENCOUNTER — Encounter (HOSPITAL_COMMUNITY): Payer: Self-pay | Admitting: Anesthesiology

## 2013-05-11 HISTORY — PX: AMPUTATION: SHX166

## 2013-05-11 LAB — BASIC METABOLIC PANEL
CO2: 27 mEq/L (ref 19–32)
Chloride: 101 mEq/L (ref 96–112)
GFR calc non Af Amer: >90 mL/min (ref >90)
Glucose, Bld: 252 mg/dL — ABNORMAL HIGH (ref 70–99)
Potassium: 4.1 mEq/L (ref 3.5–5.1)
Sodium: 138 mEq/L (ref 135–145)

## 2013-05-11 LAB — GLUCOSE, CAPILLARY
Glucose-Capillary: 259 mg/dL — ABNORMAL HIGH (ref 70–99)
Glucose-Capillary: 411 mg/dL — ABNORMAL HIGH (ref 70–99)

## 2013-05-11 LAB — SURGICAL PCR SCREEN
MRSA, PCR: NEGATIVE
Staphylococcus aureus: NEGATIVE

## 2013-05-11 SURGERY — AMPUTATION BELOW KNEE
Anesthesia: General | Site: Leg Lower | Laterality: Right

## 2013-05-11 MED ORDER — HYDROMORPHONE HCL PF 1 MG/ML IJ SOLN: 0.2500 mg | INTRAMUSCULAR | Status: DC | PRN

## 2013-05-11 MED ORDER — DEXMEDETOMIDINE HCL IN NACL 200 MCG/50ML IV SOLN
0.7000 ug/kg/h | INTRAVENOUS | Status: DC
  Filled 2013-05-11: qty 50

## 2013-05-11 MED ORDER — HYDROMORPHONE HCL PF 1 MG/ML IJ SOLN: 0.5000 mg | INTRAMUSCULAR | Status: DC | PRN

## 2013-05-11 MED ORDER — ONDANSETRON HCL 4 MG/2ML IJ SOLN: 4.0000 mg | Freq: Once | INTRAMUSCULAR | Status: DC | PRN

## 2013-05-11 MED ORDER — METOCLOPRAMIDE HCL 10 MG PO TABS: 5.0000 mg | ORAL_TABLET | Freq: Three times a day (TID) | ORAL | Status: DC | PRN

## 2013-05-11 MED ORDER — SODIUM CHLORIDE 0.9 % IV SOLN
INTRAVENOUS | Status: DC | PRN
  Administered 2013-05-11: 08:00:00 via INTRAVENOUS

## 2013-05-11 MED ORDER — PROPOFOL 10 MG/ML IV BOLUS
INTRAVENOUS | Status: DC | PRN
  Administered 2013-05-11: 150 mg via INTRAVENOUS

## 2013-05-11 MED ORDER — FENTANYL CITRATE 0.05 MG/ML IJ SOLN
INTRAMUSCULAR | Status: DC | PRN
  Administered 2013-05-11: 50 ug via INTRAVENOUS
  Administered 2013-05-11: 100 ug via INTRAVENOUS
  Administered 2013-05-11 (×2): 50 ug via INTRAVENOUS

## 2013-05-11 MED ORDER — SUCCINYLCHOLINE CHLORIDE 20 MG/ML IJ SOLN
INTRAMUSCULAR | Status: DC | PRN
  Administered 2013-05-11: 100 mg via INTRAVENOUS

## 2013-05-11 MED ORDER — DIPHENHYDRAMINE HCL 25 MG PO CAPS
25.0000 mg | ORAL_CAPSULE | Freq: Four times a day (QID) | ORAL | Status: DC | PRN
  Administered 2013-05-11 – 2013-05-12 (×2): 25 mg via ORAL
  Filled 2013-05-11 (×2): qty 1

## 2013-05-11 MED ORDER — CLINDAMYCIN PHOSPHATE 600 MG/50ML IV SOLN
600.0000 mg | Freq: Four times a day (QID) | INTRAVENOUS | Status: AC
  Administered 2013-05-11 (×3): 600 mg via INTRAVENOUS
  Filled 2013-05-11 (×4): qty 50

## 2013-05-11 MED ORDER — FENTANYL CITRATE 0.05 MG/ML IJ SOLN: 25.0000 ug | INTRAMUSCULAR | Status: DC | PRN

## 2013-05-11 MED ORDER — 0.9 % SODIUM CHLORIDE (POUR BTL) OPTIME
TOPICAL | Status: DC | PRN
  Administered 2013-05-11: 1000 mL

## 2013-05-11 MED ORDER — DEXAMETHASONE SODIUM PHOSPHATE 10 MG/ML IJ SOLN
INTRAMUSCULAR | Status: DC | PRN
  Administered 2013-05-11: 8 mg via INTRAVENOUS

## 2013-05-11 MED ORDER — INSULIN ASPART 100 UNIT/ML ~~LOC~~ SOLN
0.0000 [IU] | Freq: Three times a day (TID) | SUBCUTANEOUS | Status: DC
  Administered 2013-05-11: 15 [IU] via SUBCUTANEOUS

## 2013-05-11 MED ORDER — METOCLOPRAMIDE HCL 5 MG/ML IJ SOLN
5.0000 mg | Freq: Three times a day (TID) | INTRAMUSCULAR | Status: DC | PRN
  Filled 2013-05-11: qty 2

## 2013-05-11 MED ORDER — MORPHINE SULFATE 2 MG/ML IJ SOLN
1.0000 mg | INTRAMUSCULAR | Status: DC | PRN
  Administered 2013-05-11 – 2013-05-13 (×10): 2 mg via INTRAVENOUS
  Filled 2013-05-11 (×10): qty 1

## 2013-05-11 MED ORDER — ONDANSETRON HCL 4 MG PO TABS: 4.0000 mg | ORAL_TABLET | Freq: Four times a day (QID) | ORAL | Status: DC | PRN

## 2013-05-11 MED ORDER — ONDANSETRON HCL 4 MG/2ML IJ SOLN
INTRAMUSCULAR | Status: DC | PRN
  Administered 2013-05-11: 4 mg via INTRAVENOUS

## 2013-05-11 MED ORDER — HYDROMORPHONE HCL PF 1 MG/ML IJ SOLN
INTRAMUSCULAR | Status: AC
  Filled 2013-05-11: qty 2

## 2013-05-11 MED ORDER — ONDANSETRON HCL 4 MG/2ML IJ SOLN
4.0000 mg | Freq: Four times a day (QID) | INTRAMUSCULAR | Status: DC | PRN
  Administered 2013-05-11: 06:00:00 4 mg via INTRAVENOUS
  Filled 2013-05-11 (×2): qty 2

## 2013-05-11 MED ORDER — INSULIN GLARGINE 100 UNIT/ML ~~LOC~~ SOLN
25.0000 [IU] | Freq: Two times a day (BID) | SUBCUTANEOUS | Status: DC
  Administered 2013-05-11: 22:00:00 25 [IU] via SUBCUTANEOUS
  Filled 2013-05-11 (×3): qty 0.25

## 2013-05-11 MED ORDER — ONDANSETRON HCL 4 MG/2ML IJ SOLN: 4.0000 mg | Freq: Four times a day (QID) | INTRAMUSCULAR | Status: DC | PRN

## 2013-05-11 MED ORDER — OXYCODONE-ACETAMINOPHEN 5-325 MG PO TABS: 1.0000 | ORAL_TABLET | ORAL | Status: DC | PRN

## 2013-05-11 SURGICAL SUPPLY — 44 items
BANDAGE ESMARK 6X9 LF (GAUZE/BANDAGES/DRESSINGS) ×1 IMPLANT
BANDAGE GAUZE ELAST BULKY 4 IN (GAUZE/BANDAGES/DRESSINGS) ×4 IMPLANT
BLADE SAW RECIP 87.9 MT (BLADE) ×2 IMPLANT
BLADE SURG 21 STRL SS (BLADE) ×2 IMPLANT
BNDG CMPR 9X6 STRL LF SNTH (GAUZE/BANDAGES/DRESSINGS) ×1
BNDG COHESIVE 6X5 TAN STRL LF (GAUZE/BANDAGES/DRESSINGS) ×4 IMPLANT
BNDG ESMARK 6X9 LF (GAUZE/BANDAGES/DRESSINGS) ×2
CLOTH BEACON ORANGE TIMEOUT ST (SAFETY) ×1 IMPLANT
COVER SURGICAL LIGHT HANDLE (MISCELLANEOUS) ×2 IMPLANT
CUFF TOURNIQUET SINGLE 34IN LL (TOURNIQUET CUFF) ×1 IMPLANT
CUFF TOURNIQUET SINGLE 44IN (TOURNIQUET CUFF) IMPLANT
DRAIN PENROSE 1/2X12 LTX STRL (WOUND CARE) IMPLANT
DRAPE EXTREMITY T 121X128X90 (DRAPE) ×2 IMPLANT
DRAPE PROXIMA HALF (DRAPES) ×2 IMPLANT
DRAPE U-SHAPE 47X51 STRL (DRAPES) ×3 IMPLANT
DRSG ADAPTIC 3X8 NADH LF (GAUZE/BANDAGES/DRESSINGS) ×2 IMPLANT
DRSG PAD ABDOMINAL 8X10 ST (GAUZE/BANDAGES/DRESSINGS) ×4 IMPLANT
DURAPREP 26ML APPLICATOR (WOUND CARE) ×1 IMPLANT
ELECT REM PT RETURN 9FT ADLT (ELECTROSURGICAL) ×2
ELECTRODE REM PT RTRN 9FT ADLT (ELECTROSURGICAL) ×1 IMPLANT
GLOVE BIOGEL PI IND STRL 9 (GLOVE) ×1 IMPLANT
GLOVE BIOGEL PI INDICATOR 9 (GLOVE) ×1
GLOVE SURG ORTHO 9.0 STRL STRW (GLOVE) ×2 IMPLANT
GOWN PREVENTION PLUS XLARGE (GOWN DISPOSABLE) ×2 IMPLANT
GOWN SRG XL XLNG 56XLVL 4 (GOWN DISPOSABLE) ×1 IMPLANT
GOWN STRL NON-REIN XL XLG LVL4 (GOWN DISPOSABLE) ×2
KIT BASIN OR (CUSTOM PROCEDURE TRAY) ×2 IMPLANT
KIT ROOM TURNOVER OR (KITS) ×2 IMPLANT
MANIFOLD NEPTUNE II (INSTRUMENTS) ×2 IMPLANT
NS IRRIG 1000ML POUR BTL (IV SOLUTION) ×2 IMPLANT
PACK GENERAL/GYN (CUSTOM PROCEDURE TRAY) ×2 IMPLANT
PAD ARMBOARD 7.5X6 YLW CONV (MISCELLANEOUS) ×4 IMPLANT
SPONGE GAUZE 4X4 12PLY (GAUZE/BANDAGES/DRESSINGS) ×3 IMPLANT
SPONGE LAP 18X18 X RAY DECT (DISPOSABLE) IMPLANT
STAPLER VISISTAT 35W (STAPLE) ×1 IMPLANT
STOCKINETTE IMPERVIOUS LG (DRAPES) ×2 IMPLANT
SUT PDS AB 1 CT  36 (SUTURE) ×3
SUT PDS AB 1 CT 36 (SUTURE) IMPLANT
SUT SILK 2 0 (SUTURE) ×2
SUT SILK 2-0 18XBRD TIE 12 (SUTURE) ×1 IMPLANT
TOWEL OR 17X24 6PK STRL BLUE (TOWEL DISPOSABLE) ×2 IMPLANT
TOWEL OR 17X26 10 PK STRL BLUE (TOWEL DISPOSABLE) ×2 IMPLANT
TUBE ANAEROBIC SPECIMEN COL (MISCELLANEOUS) IMPLANT
WATER STERILE IRR 1000ML POUR (IV SOLUTION) ×1 IMPLANT

## 2013-05-11 NOTE — Progress Notes (Signed)
Pt is refusing to have blood sugar checked. She is upset with staff because we keep waking her up to administer her medication. Pt request that we do not disturb her while she is asleep.

## 2013-05-11 NOTE — Transfer of Care (Signed)
Immediate Anesthesia Transfer of Care Note  Patient: [name] [name]  Procedure(s) Performed: Procedure(s): AMPUTATION BELOW KNEE (Right)  Patient Location: PACU  Anesthesia Type:General  Level of Consciousness: awake, alert , oriented and patient cooperative  Airway & Oxygen Therapy: Patient Spontanous Breathing and Patient connected to nasal cannula oxygen  Post-op Assessment: Report given to PACU RN and Post -op Vital signs reviewed and stable  Post vital signs: Reviewed and stable  Complications: No apparent anesthesia complications

## 2013-05-11 NOTE — Anesthesia Postprocedure Evaluation (Signed)
  Anesthesia Post-op Note  Patient: Leisure centre manager  Procedure(s) Performed: Procedure(s): AMPUTATION BELOW KNEE (Right)  Patient Location: PACU  Anesthesia Type:General  Level of Consciousness: awake, alert  and oriented  Airway and Oxygen Therapy: Patient Spontanous Breathing  Post-op Pain: mild  Post-op Assessment: Post-op Vital signs reviewed, Patient's Cardiovascular Status Stable, Respiratory Function Stable, Patent Airway and Pain level controlled  Post-op Vital Signs: stable  Complications: No apparent anesthesia complications

## 2013-05-11 NOTE — H&P (View-Only) (Signed)
Reason for Consult: Abscess ulcer plantar right foot Referring Physician: Dr Singh  [name] [name] is an 57 y.o. female.  HPI: Patient is a 57-year-old woman with diabetic neuropathy she states that she has been undergoing wound care by podiatry since September. She states that she is asked to be admitted to the hospital and continue with her conservative wound care. Patient presents at this time with ulceration abscess right foot  Past Medical History  Diagnosis Date  . Dizziness   . Fatigue   . Swelling   . Depression   . Allergy     sinus problems  . IBS (irritable bowel syndrome)   . GERD (gastroesophageal reflux disease)   . Neuromuscular disorder   . Ulcer   . Complication of anesthesia     "[name]" (05/09/2013)  . PONV (postoperative nausea and vomiting)   . Poor circulation   . Varicose vein of leg   . Type II diabetes mellitus   . Migraines     "hits me here and there" (05/09/2013)  . Arthritis     "both hands"   . Charcot's joint of foot     "both feet"   . Fibromyalgia   . Chronic lower back pain     "2 cracked vertebra; 3 herniated discs" (05/09/2013)    Past Surgical History  Procedure Laterality Date  . Cesarean section  1980; 1996  . Bunionectomy Right   . Cholecystectomy    . Tubal ligation  1996  . Retinal detachment surgery Left ~ 2012  . Varicose vein surgery Left ~ 2003    "it was full of blood clots" (05/09/2013)    Family History  Problem Relation Age of Onset  . Cancer Father     Social History:  reports that she has quit smoking. Her smoking use included Cigarettes. She has a 3.75 pack-year smoking history. She has never used smokeless tobacco. She reports that she does not drink alcohol or use illicit drugs.  Allergies:  Allergies  Allergen Reactions  . Codeine Anaphylaxis and Hives  . Darvocet [Propoxyphene-Acetaminophen] Anaphylaxis  . Demerol [Meperidine] Anaphylaxis  . Ibuprofen Anaphylaxis  . Iodine  Anaphylaxis  . Naproxen Anaphylaxis and Hives  . Penicillins Anaphylaxis  . Sulfa Antibiotics Nausea And Vomiting  . Other     Bee stings and medication in the cain family  . Tape     Adhesive tapes    Medications: I have reviewed the patient's current medications.  Results for orders placed during the hospital encounter of 05/09/13 (from the past 48 hour(s))  GLUCOSE, CAPILLARY     Status: Abnormal   Collection Time    05/09/13  3:16 PM      Result Value Range   Glucose-Capillary 489 (*) 70 - 99 mg/dL  COMPREHENSIVE METABOLIC PANEL     Status: Abnormal   Collection Time    05/09/13  3:17 PM      Result Value Range   Sodium 131 (*) 135 - 145 mEq/L   Potassium 4.5  3.5 - 5.1 mEq/L   Chloride 94 (*) 96 - 112 mEq/L   CO2 25  19 - 32 mEq/L   Glucose, Bld 495 (*) 70 - 99 mg/dL   BUN 9  6 - 23 mg/dL   Creatinine, Ser 0.53  0.50 - 1.10 mg/dL   Calcium 8.7  8.4 - 10.5 mg/dL   Total Protein 7.3  6.0 - 8.3 g/dL   Albumin 3.2 (*)   3.5 - 5.2 g/dL   AST 13  0 - 37 U/L   ALT 8  0 - 35 U/L   Alkaline Phosphatase 85  39 - 117 U/L   Total Bilirubin 0.7  0.3 - 1.2 mg/dL   GFR calc non Af Amer >90  >90 mL/min   GFR calc Af Amer >90  >90 mL/min   Comment: (NOTE)     The eGFR has been calculated using the CKD EPI equation.     This calculation has not been validated in all clinical situations.     eGFR's persistently <90 mL/min signify possible Chronic Kidney     Disease.  CBC     Status: Abnormal   Collection Time    05/09/13  3:17 PM      Result Value Range   WBC 14.8 (*) 4.0 - 10.5 K/uL   RBC 4.22  3.87 - 5.11 MIL/uL   Hemoglobin 12.4  12.0 - 15.0 g/dL   HCT 35.8 (*) 36.0 - 46.0 %   MCV 84.8  78.0 - 100.0 fL   MCH 29.4  26.0 - 34.0 pg   MCHC 34.6  30.0 - 36.0 g/dL   RDW 12.4  11.5 - 15.5 %   Platelets 211  150 - 400 K/uL  GLUCOSE, CAPILLARY     Status: Abnormal   Collection Time    05/09/13  8:47 PM      Result Value Range   Glucose-Capillary 357 (*) 70 - 99 mg/dL  GLUCOSE,  CAPILLARY     Status: Abnormal   Collection Time    05/10/13 12:04 AM      Result Value Range   Glucose-Capillary 307 (*) 70 - 99 mg/dL  GLUCOSE, CAPILLARY     Status: Abnormal   Collection Time    05/10/13  4:49 AM      Result Value Range   Glucose-Capillary 200 (*) 70 - 99 mg/dL  BASIC METABOLIC PANEL     Status: Abnormal   Collection Time    05/10/13  5:26 AM      Result Value Range   Sodium 136  135 - 145 mEq/L   Potassium 3.4 (*) 3.5 - 5.1 mEq/L   Comment: DELTA CHECK NOTED   Chloride 101  96 - 112 mEq/L   CO2 26  19 - 32 mEq/L   Glucose, Bld 236 (*) 70 - 99 mg/dL   BUN 9  6 - 23 mg/dL   Creatinine, Ser 0.49 (*) 0.50 - 1.10 mg/dL   Calcium 8.1 (*) 8.4 - 10.5 mg/dL   GFR calc non Af Amer >90  >90 mL/min   GFR calc Af Amer >90  >90 mL/min   Comment: (NOTE)     The eGFR has been calculated using the CKD EPI equation.     This calculation has not been validated in all clinical situations.     eGFR's persistently <90 mL/min signify possible Chronic Kidney     Disease.  CBC     Status: Abnormal   Collection Time    05/10/13  5:26 AM      Result Value Range   WBC 11.3 (*) 4.0 - 10.5 K/uL   RBC 3.68 (*) 3.87 - 5.11 MIL/uL   Hemoglobin 10.7 (*) 12.0 - 15.0 g/dL   HCT 30.9 (*) 36.0 - 46.0 %   MCV 84.0  78.0 - 100.0 fL   MCH 29.1  26.0 - 34.0 pg   MCHC 34.6  30.0 - 36.0 g/dL     RDW 12.6  11.5 - 15.5 %   Platelets 203  150 - 400 K/uL  HEMOGLOBIN A1C     Status: Abnormal   Collection Time    05/10/13  5:26 AM      Result Value Range   Hemoglobin A1C 10.8 (*) <5.7 %   Comment: (NOTE)                                                                               According to the ADA Clinical Practice Recommendations for 2011, when     HbA1c is used as a screening test:      >=6.5%   Diagnostic of Diabetes Mellitus               (if abnormal result is confirmed)     5.7-6.4%   Increased risk of developing Diabetes Mellitus     References:Diagnosis and Classification of  Diabetes Mellitus,Diabetes     Care,2011,34(Suppl 1):S62-S69 and Standards of Medical Care in             Diabetes - 2011,Diabetes Care,2011,34 (Suppl 1):S11-S61.   Mean Plasma Glucose 263 (*) <117 mg/dL   Comment: Performed at [facility]  GLUCOSE, CAPILLARY     Status: Abnormal   Collection Time    05/10/13  7:49 AM      Result Value Range   Glucose-Capillary 282 (*) 70 - 99 mg/dL  GLUCOSE, CAPILLARY     Status: Abnormal   Collection Time    05/10/13 11:29 AM      Result Value Range   Glucose-Capillary 244 (*) 70 - 99 mg/dL  GLUCOSE, CAPILLARY     Status: Abnormal   Collection Time    05/10/13  4:14 PM      Result Value Range   Glucose-Capillary 297 (*) 70 - 99 mg/dL    Dg Foot Complete Right  05/09/2013   CLINICAL DATA:  Recurrent skin infection with ulcer on the plantar surface of the right foot.  EXAM: RIGHT FOOT COMPLETE - 3+ VIEW  COMPARISON:  MRI of foot April 18, 2013  FINDINGS: There is no evidence of fracture or dislocation. There are degenerative joint changes of the midfoot. There is plantar calcaneal spur. Postsurgical changes are identified in the distal 1st metatarsal. There are extensive soft tissue swelling and air in the plantar surface of the foot. There is no bony destruction to suggest osteomyelitis.  IMPRESSION: Marked soft tissue swelling and air in the plantar surface of the right foot consistent with infection. No bony destruction to suggest osteomyelitis.   Electronically Signed   By: Wei-Chen  Lin M.D.   On: 05/09/2013 18:12    Review of Systems  All other systems reviewed and are negative.   Blood pressure 139/67, pulse 85, temperature 97.6 F (36.4 C), temperature source Oral, resp. rate 20, height 5' 8" (1.727 m), weight 90.8 kg (200 lb 2.8 oz), SpO2 98.00%. Physical Exam On examination patient has a palpable dorsalis pedis pulse. She has warmth and redness on the plantar aspect of her foot. There are 2 large ulcers in the plantar aspect of  her foot about 2 cm in diameter. One ulcer probes all way down   to bone beneath the metatarsals. The second ulcer is soft and necrotic with no drainage. Her radiographs are reviewed and shows the more proximal heel ulcer extends down to bone with air in the soft tissue down to the calcaneus. The ulcer beneath the metatarsal head shows no bony destruction. She does have some mild Charcot changes to her foot but no significant Charcot arthropathy. Review of the MRI scan from November does not show the ulcerative abscess beneath the calcaneus but does show the ulcer extending down to bone beneath the metatarsals. Patient states that this new ulcer beneath the calcaneus is new was not present at the time of her MRI scan. Assessment/Plan: Assessment: Osteomyelitis ulceration abscess right foot.  Plan: I do not feel that there are any foot salvage options for the patient. Recommended continued IV antibiotics would then plan to proceed with a transtibial amputation tomorrow morning. Anticipate patient will need discharge to skilled nursing facility.  Teagen Bucio V 05/10/2013, 6:41 PM      

## 2013-05-11 NOTE — Preoperative (Signed)
Beta Blockers   Reason not to administer Beta Blockers:Not Applicable 

## 2013-05-11 NOTE — Anesthesia Preprocedure Evaluation (Addendum)
Anesthesia Evaluation  Patient identified by MRN, date of birth, ID band Patient awake    Reviewed: Allergy & Precautions, H&P , NPO status , Patient's Chart, lab work & pertinent test results  History of Anesthesia Complications (+) PONV and history of anesthetic complications  Airway Mallampati: II TM Distance: >3 FB Neck ROM: Limited    Dental  (+) Edentulous Lower and Edentulous Upper   Pulmonary former smoker,  breath sounds clear to auscultation  Pulmonary exam normal       Cardiovascular hypertension, Pt. on medications + Peripheral Vascular Disease Rhythm:Regular Rate:Normal     Neuro/Psych  Headaches, PSYCHIATRIC DISORDERS Depression    GI/Hepatic Neg liver ROS, GERD-  Medicated and Controlled,  Endo/Other  diabetes, Poorly Controlled, Type 2, Insulin Dependent  Renal/GU negative Renal ROS     Musculoskeletal  (+) Fibromyalgia -  Abdominal Normal abdominal exam  (+)   Peds  Hematology negative hematology ROS (+)   Anesthesia Other Findings   Reproductive/Obstetrics                        Anesthesia Physical Anesthesia Plan  ASA: III  Anesthesia Plan: General   Post-op Pain Management:    Induction: Intravenous  Airway Management Planned:   Additional Equipment:   Intra-op Plan:   Post-operative Plan: Extubation in OR  Informed Consent:   Dental advisory given  Plan Discussed with: CRNA and Anesthesiologist  Anesthesia Plan Comments: (PVD abscess R. Foot Type 2 DM glucose 252 Htn  Plan GA with oral ETT  Kipp Brood, MD)       Anesthesia Quick Evaluation

## 2013-05-11 NOTE — Progress Notes (Signed)
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 Recurrent Skin Infections        Assessment & Plan    1. 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 Stop antibiotics after 24 more hours. Will initiate PT and look for placement.   2.HTN - continue on Coreg and lisinopril and monitor.    3.DM-2 - poor control, have her just report long and short-acting insulin regimen, also added pre meal short-acting insulin.  Lab Results  Component Value Date   HGBA1C 10.8* 05/10/2013    CBG (last 3)   Recent Labs  05/11/13 0717 05/11/13 0854 05/11/13 1151  GLUCAP 259* 248* 296*       Depression  Will continue Valium. Not suicidal homicidal, outpatient psych followup.       Code Status: Full  Family Communication: none present  Disposition Plan: Home   Procedures X ray R. Foot, right BKA 05/11/2013   Consults  W.Care, Ortho-Duda   Medications  Scheduled Meds: . aspirin  324 mg Oral Daily  . aztreonam  1 g Intravenous Q8H  . carvedilol  3.125 mg Oral BID WC  . clindamycin (CLEOCIN) IV  600 mg Intravenous Q6H  . feeding supplement (GLUCERNA SHAKE)  237 mL Oral BID BM  . heparin  5,000 Units Subcutaneous Q8H  . insulin aspart  0-15 Units Subcutaneous TID WC  . insulin aspart  3 Units Subcutaneous TID WC  . insulin glargine  25 Units Subcutaneous BID  . lisinopril  10 mg Oral Daily  . vancomycin  1,000 mg Intravenous Q8H   Continuous Infusions:  PRN Meds:.acetaminophen, acetaminophen,  diazepam, metoCLOPramide (REGLAN) injection, metoCLOPramide, morphine injection, ondansetron, ondansetron (ZOFRAN) IV, ondansetron, traMADol  DVT Prophylaxis   Heparin   Lab Results  Component Value Date   PLT 203 05/10/2013    Antibiotics     Anti-infectives   Start     Dose/Rate Route Frequency Ordered Stop   05/11/13 1100  clindamycin (CLEOCIN) IVPB 600 mg     600 mg 100 mL/hr over 30 Minutes Intravenous Every 6 hours 05/11/13 1011 05/12/13 0459   05/09/13 2100  vancomycin (VANCOCIN) IVPB 1000 mg/200 mL premix     1,000 mg 200 mL/hr over 60 Minutes Intravenous Every 8 hours 05/09/13 2018     05/09/13 2100  aztreonam (AZACTAM) 1 g in dextrose 5 % 50 mL IVPB     1 g 100 mL/hr over 30 Minutes Intravenous 3 times per day 05/09/13 2018     05/09/13 1830  vancomycin (VANCOCIN) IVPB 1000 mg/200 mL premix     1,000 mg 200 mL/hr over 60 Minutes Intravenous  Once 05/09/13 1827  05/09/13 1936          Subjective:   [name] [name] today has, No headache, No chest pain, No abdominal pain - No Nausea, No new weakness tingling or numbness, No Cough - SOB.    Objective:   Filed Vitals:   05/11/13 0920 05/11/13 0935 05/11/13 0950 05/11/13 0955  BP: 132/51 149/72 153/71   Pulse: 92 90 92   Temp:    98.5 F (36.9 C)  TempSrc:      Resp: 16 15 16    Height:      Weight:      SpO2: 92% 100% 100%     Wt Readings from Last 3 Encounters:  05/09/13 90.8 kg (200 lb 2.8 oz)  05/09/13 90.8 kg (200 lb 2.8 oz)  03/14/13 95.709 kg (211 lb)     Intake/Output Summary (Last 24 hours) at 05/11/13 1212 Last data filed at 05/11/13 0850  Gross per 24 hour  Intake    470 ml  Output    150 ml  Net    320 ml    Exam Awake Alert, Oriented X 3, No new F.N deficits, Normal affect .AT,PERRAL Supple Neck,No JVD, No cervical lymphadenopathy appriciated.  Symmetrical Chest wall movement, Good air movement bilaterally, CTAB RRR,No Gallops,Rubs or new Murmurs, No Parasternal Heave +ve  B.Sounds, Abd Soft, Non tender, No organomegaly appriciated, No rebound - guarding or rigidity. No Cyanosis, Clubbing or edema, No new Rash or bruise  R.foot plantar aspect has 2 shallow ulcers with surrounding cellulitis, minimal pus like DC   Data Review   Micro Results Recent Results (from the past 240 hour(s))  SURGICAL PCR SCREEN     Status: None   Collection Time    05/10/13 11:47 PM      Result Value Range Status   MRSA, PCR NEGATIVE  NEGATIVE Final   Staphylococcus aureus NEGATIVE  NEGATIVE Final   Comment:            The Xpert SA Assay (FDA     approved for NASAL specimens     in patients over 56 years of age),     is one component of     a comprehensive surveillance     program.  Test performance has     been validated by [employer] for patients greater     than or equal to 53 year old.     It is not intended     to diagnose infection nor to     guide or monitor treatment.    Radiology Reports Dg Foot Complete Right  05/09/2013   CLINICAL DATA:  Recurrent skin infection with ulcer on the plantar surface of the right foot.  EXAM: RIGHT FOOT COMPLETE - 3+ VIEW  COMPARISON:  MRI of foot April 18, 2013  FINDINGS: There is no evidence of fracture or dislocation. There are degenerative joint changes of the midfoot. There is plantar calcaneal spur. Postsurgical changes are identified in the distal 1st metatarsal. There are extensive soft tissue swelling and air in the plantar surface of the foot. There is no bony destruction to suggest osteomyelitis.  IMPRESSION: Marked soft tissue swelling and air in the plantar surface of the right foot consistent with infection. No bony destruction to suggest osteomyelitis.   Electronically Signed   By: Sherian Rein M.D.   On: 05/09/2013 18:12    CBC  Recent Labs Lab 05/09/13 1517 05/10/13 0526  WBC 14.8* 11.3*  HGB 12.4  10.7*  HCT 35.8* 30.9*  PLT 211 203  MCV 84.8 84.0  MCH 29.4 29.1  MCHC 34.6 34.6  RDW 12.4 12.6     Chemistries   Recent Labs Lab 05/09/13 1517 05/10/13 0526 05/11/13 0400  NA 131* 136 138  K 4.5 3.4* 4.1  CL 94* 101 101  CO2 25 26 27   GLUCOSE 495* 236* 252*  BUN 9 9 7   CREATININE 0.53 0.49* 0.42*  CALCIUM 8.7 8.1* 8.6  AST 13  --   --   ALT 8  --   --   ALKPHOS 85  --   --   BILITOT 0.7  --   --    ------------------------------------------------------------------------------------------------------------------ estimated creatinine clearance is 92.6 ml/min (by C-G formula based on Cr of 0.42). ------------------------------------------------------------------------------------------------------------------  Recent Labs  05/10/13 0526  HGBA1C 10.8*   ------------------------------------------------------------------------------------------------------------------ No results found for this basename: CHOL, HDL, LDLCALC, TRIG, CHOLHDL, LDLDIRECT,  in the last 72 hours ------------------------------------------------------------------------------------------------------------------ No results found for this basename: TSH, T4TOTAL, FREET3, T3FREE, THYROIDAB,  in the last 72 hours ------------------------------------------------------------------------------------------------------------------ No results found for this basename: VITAMINB12, FOLATE, FERRITIN, TIBC, IRON, RETICCTPCT,  in the last 72 hours  Coagulation profile No results found for this basename: INR, PROTIME,  in the last 168 hours  No results found for this basename: DDIMER,  in the last 72 hours  Cardiac Enzymes No results found for this basename: CK, CKMB, TROPONINI, MYOGLOBIN,  in the last 168 hours ------------------------------------------------------------------------------------------------------------------ No components found with this basename: POCBNP,      Time Spent in minutes   35   Elenie Coven K M.D on 05/11/2013 at 12:12 PM  Between 7am to 7pm - Pager - [phone]  After 7pm  go to [email or web address] - password [email or web address]  And look for the night coverage person covering for me after hours  [facility] Office  [phone]

## 2013-05-11 NOTE — Op Note (Signed)
OPERATIVE REPORT  DATE OF SURGERY: 05/11/2013  PATIENT:  [name] [name],  57 y.o. 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 She has undergone prolonged conservative therapy she presented at this time with new ulceration with cellulitis abscess purulence ulceration probing to bone and presents at this time for transtibial amputation do to the lack of foot salvage surgical intervention. Risks and benefits were discussed including infection neurovascular injury nonhealing of the wound need for additional surgery. Patient states she understands and wished to proceed at this time. Description of procedure patient was brought to the operating room and underwent a general anesthetic. After adequate levels of anesthesia were obtained patient's right lower extremity was prepped using DuraPrep draped into a sterile field in the right foot was draped out of the sterile field with an impervious stockinette. A transverse incision was made 11 cm distal to the tibial tubercle this curved proximally and a large posterior flap was created. The tibia and fibula were transected 1 cm proximal to the skin incision. Large posterior flap was created. The sciatic nerve was pulled cut and allowed to retract. The vascular bundles were suture ligated with 2-0 silk. The deep and superficial fascial layers were closed using #1 PDS. The skin was closed using staples. The  wound is covered with Adaptic orthopedic sponges AB dressing Kerlix and Coban. Patient was extubated taken to the PACU in stable condition.  PLAN OF CARE: Admit to inpatient   PATIENT DISPOSITION:  PACU - hemodynamically stable.   Nadara Mustard, MD 05/11/2013 8:42 AM

## 2013-05-11 NOTE — Progress Notes (Signed)
Pt was given lisinopril and 20 of lantus at 1100 after coming back from surgery. MAR does not show meds were given. Peter Congo RN

## 2013-05-11 NOTE — Interval H&P Note (Signed)
History and Physical Interval Note:  05/11/2013 5:53 AM  [name] [name]  has presented today for surgery, with the diagnosis of /  The various methods of treatment have been discussed with the patient and family. After consideration of risks, benefits and other options for treatment, the patient has consented to  Procedure(s): AMPUTATION BELOW KNEE (Right) as a surgical intervention .  The patient's history has been reviewed, patient examined, no change in status, stable for surgery.  I have reviewed the patient's chart and labs.  Questions were answered to the patient's satisfaction.     DUDA,MARCUS V

## 2013-05-12 LAB — GLUCOSE, CAPILLARY
Glucose-Capillary: 335 mg/dL — ABNORMAL HIGH (ref 70–99)
Glucose-Capillary: 369 mg/dL — ABNORMAL HIGH (ref 70–99)
Glucose-Capillary: 300 mg/dL — ABNORMAL HIGH (ref 70–99)

## 2013-05-12 MED ORDER — INSULIN GLARGINE 100 UNIT/ML ~~LOC~~ SOLN
30.0000 [IU] | Freq: Two times a day (BID) | SUBCUTANEOUS | Status: DC
Start: 2013-05-12 — End: 2013-05-13
  Administered 2013-05-12 (×2): 30 [IU] via SUBCUTANEOUS
  Filled 2013-05-12 (×4): qty 0.3

## 2013-05-12 MED ORDER — INSULIN ASPART 100 UNIT/ML ~~LOC~~ SOLN
5.0000 [IU] | Freq: Three times a day (TID) | SUBCUTANEOUS | Status: DC
  Administered 2013-05-12 – 2013-05-13 (×5): 5 [IU] via SUBCUTANEOUS

## 2013-05-12 MED ORDER — INSULIN ASPART 100 UNIT/ML ~~LOC~~ SOLN
0.0000 [IU] | Freq: Every day | SUBCUTANEOUS | Status: DC
  Administered 2013-05-12: 3 [IU] via SUBCUTANEOUS

## 2013-05-12 MED ORDER — HYDROCODONE-ACETAMINOPHEN 7.5-325 MG PO TABS: 1.0000 | ORAL_TABLET | Freq: Four times a day (QID) | ORAL | Status: DC | PRN

## 2013-05-12 MED ORDER — INSULIN ASPART 100 UNIT/ML ~~LOC~~ SOLN
0.0000 [IU] | Freq: Three times a day (TID) | SUBCUTANEOUS | Status: DC
  Administered 2013-05-12: 15 [IU] via SUBCUTANEOUS
  Administered 2013-05-12: 11 [IU] via SUBCUTANEOUS
  Administered 2013-05-12: 8 [IU] via SUBCUTANEOUS
  Administered 2013-05-13 (×2): 11 [IU] via SUBCUTANEOUS

## 2013-05-12 NOTE — Clinical Social Work Note (Signed)
Clinical Social Work Department BRIEF PSYCHOSOCIAL ASSESSMENT 05/12/2013  Patient:  [name] [name], [name] [name]     Account Number:  [id number]     Admit date:  05/09/2013  Clinical Social Worker:  Lavell Luster  Date/Time:  05/12/2013 02:02 PM  Referred by:  Physician  Date Referred:  05/12/2013 Referred for  SNF Placement   Other Referral:   Interview type:  Patient Other interview type:   Patient alert and oriented at time of assessment.    PSYCHOSOCIAL DATA Living Status:  HUSBAND Admitted from facility:   Level of care:   Primary support name:  [name] [phone], [phone] Primary support relationship to patient:  SPOUSE Degree of support available:   Support is good.    CURRENT CONCERNS Current Concerns  Post-Acute Placement   Other Concerns:    SOCIAL WORK ASSESSMENT / PLAN CSW met with patient at bedside to discuss recommendation for SNF. Patient is hopefully for CIR admission but is willing to have SNF backup plan in place. Patient states that she lives with her husband in [place]. Patient has a preference for [facility]. Patient plans to return home after rehab. CSW will continue to follow.   Assessment/plan status:  Psychosocial Support/Ongoing Assessment of Needs Other assessment/ plan:   Complete FL2, Fax, PASRR   Information/referral to community resources:   SNF list and CSW contact information given to patient.    PATIENT'S/FAMILY'S RESPONSE TO PLAN OF CARE: Patient is agreeable to SNF as backup to CIR. Patient was pleasant, appropriate, and appreciative of CSW contact. Patient was engaged in assessment. CSW will follow up.     [name], [name], [name], [phone]

## 2013-05-12 NOTE — Progress Notes (Signed)
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 Recurrent Skin Infections        Assessment & Plan    1. 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 Stop antibiotics after today. Will initiate PT and look for placement. Have also consulted CIR    2.HTN - continue on Coreg and lisinopril and monitor.    3.DM-2 - poor control, have more than doubled her Lantus, added pre-meal NovoLog along with moderate dose NovoLog sliding scale. I question if a Lantus dose was missed on 05/11/2013.  Lab Results  Component Value Date   HGBA1C 10.8* 05/10/2013    CBG (last 3)   Recent Labs  05/11/13 2105 05/12/13 0207 05/12/13 0747  GLUCAP 411* 335* 369*       Depression  Will continue Valium. Not suicidal homicidal, outpatient psych followup.       Code Status: Full  Family Communication: none present  Disposition Plan: Home   Procedures X ray R. Foot, right BKA 05/11/2013   Consults  W.Care, Ortho-[facility]uda   Medications  Scheduled Meds: . aspirin  324 mg Oral Daily  . aztreonam  1 g Intravenous Q8H  . carvedilol  3.125 mg Oral BID WC  . feeding supplement (GLUCERNA SHAKE)  237 mL Oral BID BM  . heparin  5,000 Units Subcutaneous Q8H  . insulin aspart  0-15 Units Subcutaneous TID WC  . insulin aspart  0-5 Units Subcutaneous QHS  . insulin aspart  5 Units Subcutaneous TID WC  . insulin glargine  25 Units Subcutaneous BID  . lisinopril  10 mg Oral Daily  . vancomycin  1,000 mg Intravenous Q8H    Continuous Infusions:  PRN Meds:.acetaminophen, acetaminophen, diazepam, diphenhydrAMINE, HYDROcodone-acetaminophen, metoCLOPramide (REGLAN) injection, metoCLOPramide, morphine injection, ondansetron, ondansetron (ZOFRAN) IV, ondansetron, traMADol  DVT Prophylaxis   Heparin   Lab Results  Component Value Date   PLT 203 05/10/2013    Antibiotics     Anti-infectives   Start     Dose/Rate Route Frequency Ordered Stop   05/11/13 1100  clindamycin (CLEOCIN) IVPB 600 mg     600 mg 100 mL/hr over 30 Minutes Intravenous Every 6 hours 05/11/13 1011 05/11/13 2255   05/09/13 2100  vancomycin (VANCOCIN) IVPB 1000 mg/200 mL premix     1,000 mg 200 mL/hr over 60 Minutes Intravenous Every 8 hours 05/09/13 2018     05/09/13 2100  aztreonam (AZACTAM) 1 g in dextrose 5 % 50 mL IVPB     1 g 100 mL/hr over 30 Minutes Intravenous 3 times per day 05/09/13 2018     05/09/13 1830  vancomycin (VANCOCIN) IVPB 1000 mg/200 mL premix  1,000 mg 200 mL/hr over 60 Minutes Intravenous  Once 05/09/13 1827 05/09/13 1936          Subjective:   [name] [name] today has, No headache, No chest pain, No abdominal pain - No Nausea, No new weakness tingling or numbness, No Cough - SOB.    Objective:   Filed Vitals:   05/11/13 0955 05/11/13 1423 05/11/13 2106 05/12/13 0541  BP:  104/67 112/66 123/72  Pulse:  96 104 89  Temp: 98.5 F (36.9 C) 98.5 F (36.9 C) 98.4 F (36.9 C) 98.4 F (36.9 C)  TempSrc:  Oral Oral Oral  Resp:  18 17 17   Height:      Weight:      SpO2:  96% 94% 97%    Wt Readings from Last 3 Encounters:  05/09/13 90.8 kg (200 lb 2.8 oz)  05/09/13 90.8 kg (200 lb 2.8 oz)  03/14/13 95.709 kg (211 lb)     Intake/Output Summary (Last 24 hours) at 05/12/13 0923 Last data filed at 05/12/13 0630  Gross per 24 hour  Intake    740 ml  Output      0 ml  Net    740 ml    Exam Awake Alert, Oriented X 3, No new F.N deficits, Normal affect [place].AT,PERRAL Supple Neck,No JVD, No  cervical lymphadenopathy appriciated.  Symmetrical Chest wall movement, Good air movement bilaterally, CTAB RRR,No Gallops,Rubs or new Murmurs, No Parasternal Heave +ve B.Sounds, Abd Soft, Non tender, No organomegaly appriciated, No rebound - guarding or rigidity. No Cyanosis, Clubbing or edema, No new Rash or bruise  R.foot plantar aspect has 2 shallow ulcers with surrounding cellulitis, minimal pus like DC   Data Review   Micro Results Recent Results (from the past 240 hour(s))  SURGICAL PCR SCREEN     Status: None   Collection Time    05/10/13 11:47 PM      Result Value Range Status   MRSA, PCR NEGATIVE  NEGATIVE Final   Staphylococcus aureus NEGATIVE  NEGATIVE Final   Comment:            The Xpert SA Assay (FDA     approved for NASAL specimens     in patients over 93 years of age),     is one component of     a comprehensive surveillance     program.  Test performance has     been validated by [employer] for patients greater     than or equal to 18 year old.     It is not intended     to diagnose infection nor to     guide or monitor treatment.    Radiology Reports Dg Foot Complete Right  05/09/2013   CLINICAL DATA:  Recurrent skin infection with ulcer on the plantar surface of the right foot.  EXAM: RIGHT FOOT COMPLETE - 3+ VIEW  COMPARISON:  MRI of foot April 18, 2013  FINDINGS: There is no evidence of fracture or dislocation. There are degenerative joint changes of the midfoot. There is plantar calcaneal spur. Postsurgical changes are identified in the distal 1st metatarsal. There are extensive soft tissue swelling and air in the plantar surface of the foot. There is no bony destruction to suggest osteomyelitis.  IMPRESSION: Marked soft tissue swelling and air in the plantar surface of the right foot consistent with infection. No bony destruction to suggest osteomyelitis.   Electronically Signed   By: Gabriel Carina.D.  On: 05/09/2013 18:12    CBC  Recent  Labs Lab 05/09/13 1517 05/10/13 0526  WBC 14.8* 11.3*  HGB 12.4 10.7*  HCT 35.8* 30.9*  PLT 211 203  MCV 84.8 84.0  MCH 29.4 29.1  MCHC 34.6 34.6  RDW 12.4 12.6    Chemistries   Recent Labs Lab 05/09/13 1517 05/10/13 0526 05/11/13 0400  NA 131* 136 138  K 4.5 3.4* 4.1  CL 94* 101 101  CO2 25 26 27   GLUCOSE 495* 236* 252*  BUN 9 9 7   CREATININE 0.53 0.49* 0.42*  CALCIUM 8.7 8.1* 8.6  AST 13  --   --   ALT 8  --   --   ALKPHOS 85  --   --   BILITOT 0.7  --   --    ------------------------------------------------------------------------------------------------------------------ estimated creatinine clearance is 92.6 ml/min (by C-G formula based on Cr of 0.42). ------------------------------------------------------------------------------------------------------------------  Recent Labs  05/10/13 0526  HGBA1C 10.8*   ------------------------------------------------------------------------------------------------------------------ No results found for this basename: CHOL, HDL, LDLCALC, TRIG, CHOLHDL, LDLDIRECT,  in the last 72 hours ------------------------------------------------------------------------------------------------------------------ No results found for this basename: TSH, T4TOTAL, FREET3, T3FREE, THYROIDAB,  in the last 72 hours ------------------------------------------------------------------------------------------------------------------ No results found for this basename: VITAMINB12, FOLATE, FERRITIN, TIBC, IRON, RETICCTPCT,  in the last 72 hours  Coagulation profile No results found for this basename: INR, PROTIME,  in the last 168 hours  No results found for this basename: DDIMER,  in the last 72 hours  Cardiac Enzymes No results found for this basename: CK, CKMB, TROPONINI, MYOGLOBIN,  in the last 168 hours ------------------------------------------------------------------------------------------------------------------ No components found  with this basename: POCBNP,      Time Spent in minutes   35   SINGH,PRASHANT K M.D on 05/12/2013 at 9:23 AM  Between 7am to 7pm - Pager - [phone]  After 7pm go to [email or web address] - password [email or web address]  And look for the night coverage person covering for me after hours  [facility] Office  [phone]

## 2013-05-12 NOTE — Clinical Social Work Placement (Signed)
Clinical Social Work Department CLINICAL SOCIAL WORK PLACEMENT NOTE 05/12/2013  Patient:  [name] [name], [name] [name]  Account Number:  [id number] Admit date:  05/09/2013  Clinical Social Worker:  Lavell Luster  Date/time:  05/12/2013 02:11 PM  Clinical Social Work is seeking post-discharge placement for this patient at the following level of care:   SKILLED NURSING   (*CSW will update this form in Epic as items are completed)   05/12/2013  Patient/family provided with [facility] Department of Clinical Social Work's list of facilities offering this level of care within the geographic area requested by the patient (or if unable, by the patient's family).  05/12/2013  Patient/family informed of their freedom to choose among providers that offer the needed level of care, that participate in Medicare, Medicaid or managed care program needed by the patient, have an available bed and are willing to accept the patient.  05/12/2013  Patient/family informed of [facility]' ownership interest in [facility], as well as of the fact that they are under no obligation to receive care at this facility.  PASARR submitted to EDS on 05/12/2013 PASARR number received from [employer]DS on 05/12/2013  FL2 transmitted to all facilities in geographic area requested by pt/family on  05/12/2013 FL2 transmitted to all facilities within larger geographic area on   Patient informed that his/her managed care company has contracts with or will negotiate with  certain facilities, including the following:     Patient/family informed of bed offers received:   Patient chooses bed at  Physician recommends and patient chooses bed at    Patient to be transferred to  on   Patient to be transferred to facility by   The following physician request were entered in Epic:   Additional Comments:   [name], [place], [place], [phone]

## 2013-05-12 NOTE — Evaluation (Signed)
Physical Therapy Evaluation Patient Details Name: [name] [name] MRN: [id number] DOB: 01/17/1956 Today's Date: 05/12/2013 Time: 1478-2956 PT Time Calculation (min): 28 min  PT Assessment / Plan / Recommendation History of Present Illness  Patient is a 57 yo female admitted with Rt chronic foot ulcer.  Patient now s/p Rt BKA.  Patient also with uncontrolled DM.  Clinical Impression  Patient presents with problems listed below.  Will benefit from acute PT to maximize independence prior to discharge.  Patient independent pta.  Recommend Inpatient Rehab consult for comprehensive therapies to maximize functional independence.    PT Assessment  Patient needs continued PT services    Follow Up Recommendations  CIR    Does the patient have the potential to tolerate intense rehabilitation      Barriers to Discharge Decreased caregiver support      Equipment Recommendations  Rolling walker with 5" wheels;Wheelchair (measurements PT);Wheelchair cushion (measurements PT)    Recommendations for Other Services Rehab consult   Frequency Min 4X/week    Precautions / Restrictions Precautions Precautions: Fall Restrictions Weight Bearing Restrictions: No   Pertinent Vitals/Pain Pain 8/10 limiting mobility      Mobility  Bed Mobility Bed Mobility: Supine to Sit;Sitting - Scoot to Edge of Bed;Sit to Supine Supine to Sit: 3: Mod assist;With rails;HOB elevated Sitting - Scoot to Edge of Bed: 4: Min assist;With rail Sit to Supine: 4: Min assist;With rail;HOB elevated Details for Bed Mobility Assistance: Verbal cues for technique.  Used bed pad to assist patient to sitting, moving RLE to EOB.  Once sitting, patient with good sitting balance. Transfers Transfers: Sit to Stand Sit to Stand: 2: Max assist;With upper extremity assist;From bed Details for Transfer Assistance: Verbal cues for hand placement and technique.  Attempted x3 unsuccessfully.  Patient reports she still feels her Rt  foot, and tries to put it on floor (making her slide closer to EOB - unsafe). Ambulation/Gait Ambulation/Gait Assistance: Not tested (comment)    Exercises General Exercises - Lower Extremity Ankle Circles/Pumps: AROM;Left;10 reps;Seated Quad Sets: AROM;Right;5 reps;Supine Long Arc Quad: AROM;Both;10 reps;Seated Hip Flexion/Marching: AROM;Left;10 reps;Seated Amputee Exercises Chair Push Up: AROM;Both;10 reps;Seated Other Exercises Other Exercises: Half-bridging LLE in supine   PT Diagnosis: Difficulty walking;Generalized weakness;Acute pain  PT Problem List: Decreased strength;Decreased activity tolerance;Decreased mobility;Decreased knowledge of use of DME;Impaired sensation;Obesity;Pain PT Treatment Interventions: DME instruction;Gait training;Functional mobility training;Therapeutic exercise;Patient/family education     PT Goals(Current goals can be found in the care plan section) Acute Rehab PT Goals Patient Stated Goal: To be able to return home PT Goal Formulation: With patient Time For Goal Achievement: 05/26/13 Potential to Achieve Goals: Good  Visit Information  Last PT Received On: 05/12/13 Assistance Needed: +2 History of Present Illness: Patient is a 57 yo female admitted with Rt chronic foot ulcer.  Patient now s/p Rt BKA.  Patient also with uncontrolled DM.       Prior Functioning  Home Living Family/patient expects to be discharged to:: Inpatient rehab Living Arrangements: Alone (Patient with estranged husband - taking care of her animals ) Available Help at Discharge: Family;Available PRN/intermittently Type of Home: House Home Access: Ramped entrance Home Layout: One level Home Equipment: Cane - single point;Grab bars - tub/shower Prior Function Level of Independence: Independent with assistive device(s) Communication Communication: No difficulties    Cognition  Cognition Arousal/Alertness: Awake/alert Behavior During Therapy: Anxious Overall  Cognitive Status: Within Functional Limits for tasks assessed    Extremity/Trunk Assessment Upper Extremity Assessment Upper Extremity Assessment: Overall WFL for  tasks assessed Lower Extremity Assessment Lower Extremity Assessment: RLE deficits/detail;LLE deficits/detail RLE Deficits / Details: BKA - strength at hip/knee at 3/5 RLE: Unable to fully assess due to pain RLE Sensation:  (Patient reports phantom limb sensations) LLE Deficits / Details: General weakness 4/5 LLE Sensation: history of peripheral neuropathy   Balance Balance Balance Assessed: Yes Static Sitting Balance Static Sitting - Balance Support: No upper extremity supported;Feet unsupported Static Sitting - Level of Assistance: 5: Stand by assistance Static Sitting - Comment/# of Minutes: 10  End of Session PT - End of Session Equipment Utilized During Treatment: Gait belt Activity Tolerance: Patient limited by pain;Patient limited by fatigue Patient left: in bed;with call bell/phone within reach Nurse Communication: Mobility status (Use fracture bedpan)  GP     Vena Austria 05/12/2013, 10:33 AM Durenda Hurt. Renaldo Fiddler, [facility] Acute Rehab Services Pager [phone]

## 2013-05-13 ENCOUNTER — Inpatient Hospital Stay (HOSPITAL_COMMUNITY)
Admission: RE | Admit: 2013-05-13 | Discharge: 2013-05-24 | DRG: 945 | Disposition: A | Discharge status: Home Health | Payer: Medicare Other | Source: Intra-hospital | Attending: Physical Medicine & Rehabilitation | Admitting: Physical Medicine & Rehabilitation

## 2013-05-13 DIAGNOSIS — F322 Major depressive disorder, single episode, severe without psychotic features: Secondary | ICD-10-CM

## 2013-05-13 DIAGNOSIS — E1142 Type 2 diabetes mellitus with diabetic polyneuropathy: Secondary | ICD-10-CM

## 2013-05-13 DIAGNOSIS — S88119A Complete traumatic amputation at level between knee and ankle, unspecified lower leg, initial encounter: Secondary | ICD-10-CM

## 2013-05-13 DIAGNOSIS — R45851 Suicidal ideations: Secondary | ICD-10-CM

## 2013-05-13 DIAGNOSIS — I10 Essential (primary) hypertension: Secondary | ICD-10-CM

## 2013-05-13 DIAGNOSIS — I739 Peripheral vascular disease, unspecified: Secondary | ICD-10-CM

## 2013-05-13 DIAGNOSIS — Z89511 Acquired absence of right leg below knee: Secondary | ICD-10-CM

## 2013-05-13 DIAGNOSIS — G547 Phantom limb syndrome without pain: Secondary | ICD-10-CM

## 2013-05-13 DIAGNOSIS — Z7982 Long term (current) use of aspirin: Secondary | ICD-10-CM

## 2013-05-13 DIAGNOSIS — L97509 Non-pressure chronic ulcer of other part of unspecified foot with unspecified severity: Secondary | ICD-10-CM

## 2013-05-13 DIAGNOSIS — Z794 Long term (current) use of insulin: Secondary | ICD-10-CM

## 2013-05-13 DIAGNOSIS — IMO0001 Reserved for inherently not codable concepts without codable children: Secondary | ICD-10-CM

## 2013-05-13 DIAGNOSIS — Z79899 Other long term (current) drug therapy: Secondary | ICD-10-CM

## 2013-05-13 DIAGNOSIS — Z5189 Encounter for other specified aftercare: Principal | ICD-10-CM

## 2013-05-13 DIAGNOSIS — Z87891 Personal history of nicotine dependence: Secondary | ICD-10-CM

## 2013-05-13 DIAGNOSIS — L98499 Non-pressure chronic ulcer of skin of other sites with unspecified severity: Secondary | ICD-10-CM

## 2013-05-13 DIAGNOSIS — F323 Major depressive disorder, single episode, severe with psychotic features: Secondary | ICD-10-CM

## 2013-05-13 DIAGNOSIS — Z89519 Acquired absence of unspecified leg below knee: Secondary | ICD-10-CM

## 2013-05-13 DIAGNOSIS — D62 Acute posthemorrhagic anemia: Secondary | ICD-10-CM

## 2013-05-13 DIAGNOSIS — E1149 Type 2 diabetes mellitus with other diabetic neurological complication: Secondary | ICD-10-CM

## 2013-05-13 LAB — CREATININE, SERUM
Creatinine, Ser: 0.71 mg/dL (ref 0.50–1.10)
GFR calc Af Amer: >90 mL/min (ref >90)
GFR calc non Af Amer: >90 mL/min (ref >90)

## 2013-05-13 LAB — CBC
HCT: 30.2 % — ABNORMAL LOW (ref 36.0–46.0)
MCHC: 33.4 g/dL (ref 30.0–36.0)
Platelets: 246 10*3/uL (ref 150–400)
RDW: 12.8 % (ref 11.5–15.5)
WBC: 8.2 10*3/uL (ref 4.0–10.5)

## 2013-05-13 LAB — GLUCOSE, CAPILLARY
Glucose-Capillary: 341 mg/dL — ABNORMAL HIGH (ref 70–99)
Glucose-Capillary: 305 mg/dL — ABNORMAL HIGH (ref 70–99)
Glucose-Capillary: 225 mg/dL — ABNORMAL HIGH (ref 70–99)

## 2013-05-13 MED ORDER — DIAZEPAM 2 MG PO TABS
2.0000 mg | ORAL_TABLET | Freq: Four times a day (QID) | ORAL | Status: DC | PRN
  Administered 2013-05-14 – 2013-05-23 (×9): 2 mg via ORAL
  Filled 2013-05-13 (×9): qty 1

## 2013-05-13 MED ORDER — INSULIN ASPART 100 UNIT/ML ~~LOC~~ SOLN
5.0000 [IU] | Freq: Three times a day (TID) | SUBCUTANEOUS | Status: DC
  Administered 2013-05-13 – 2013-05-24 (×27): 5 [IU] via SUBCUTANEOUS

## 2013-05-13 MED ORDER — ACETAMINOPHEN 650 MG RE SUPP: 650.0000 mg | Freq: Four times a day (QID) | RECTAL | Status: DC | PRN

## 2013-05-13 MED ORDER — ONDANSETRON HCL 4 MG/2ML IJ SOLN: 4.0000 mg | Freq: Four times a day (QID) | INTRAMUSCULAR | Status: DC | PRN

## 2013-05-13 MED ORDER — LISINOPRIL 10 MG PO TABS
10.0000 mg | ORAL_TABLET | Freq: Every day | ORAL | Status: DC
  Administered 2013-05-14: 10 mg via ORAL
  Filled 2013-05-13 (×3): qty 1

## 2013-05-13 MED ORDER — INSULIN ASPART 100 UNIT/ML ~~LOC~~ SOLN: SUBCUTANEOUS | Status: DC

## 2013-05-13 MED ORDER — INSULIN ASPART 100 UNIT/ML ~~LOC~~ SOLN
0.0000 [IU] | Freq: Three times a day (TID) | SUBCUTANEOUS | Status: DC
  Administered 2013-05-13: 8 [IU] via SUBCUTANEOUS
  Administered 2013-05-14: 3 [IU] via SUBCUTANEOUS
  Administered 2013-05-14: 8 [IU] via SUBCUTANEOUS
  Administered 2013-05-15: 3 [IU] via SUBCUTANEOUS
  Administered 2013-05-15: 2 [IU] via SUBCUTANEOUS
  Administered 2013-05-16: 5 [IU] via SUBCUTANEOUS
  Administered 2013-05-17 – 2013-05-19 (×5): 2 [IU] via SUBCUTANEOUS
  Administered 2013-05-20: 3 [IU] via SUBCUTANEOUS
  Administered 2013-05-21 – 2013-05-23 (×5): 2 [IU] via SUBCUTANEOUS

## 2013-05-13 MED ORDER — LISINOPRIL 10 MG PO TABS: 10.0000 mg | ORAL_TABLET | Freq: Every day | ORAL | Status: DC

## 2013-05-13 MED ORDER — HEPARIN SODIUM (PORCINE) 5000 UNIT/ML IJ SOLN: 5000.0000 [IU] | Freq: Three times a day (TID) | INTRAMUSCULAR | Status: DC

## 2013-05-13 MED ORDER — INSULIN GLARGINE 100 UNIT/ML ~~LOC~~ SOLN: 40.0000 [IU] | Freq: Two times a day (BID) | SUBCUTANEOUS | Status: DC

## 2013-05-13 MED ORDER — ASPIRIN 81 MG PO CHEW
324.0000 mg | CHEWABLE_TABLET | Freq: Every day | ORAL | Status: DC
  Administered 2013-05-14 – 2013-05-24 (×11): 324 mg via ORAL
  Filled 2013-05-13 (×13): qty 4

## 2013-05-13 MED ORDER — HEPARIN SODIUM (PORCINE) 5000 UNIT/ML IJ SOLN
5000.0000 [IU] | Freq: Three times a day (TID) | INTRAMUSCULAR | Status: DC
  Administered 2013-05-13 – 2013-05-24 (×32): 5000 [IU] via SUBCUTANEOUS
  Filled 2013-05-13 (×36): qty 1

## 2013-05-13 MED ORDER — CARVEDILOL 3.125 MG PO TABS
3.1250 mg | ORAL_TABLET | Freq: Two times a day (BID) | ORAL | Status: DC
  Administered 2013-05-13 – 2013-05-24 (×15): 3.125 mg via ORAL
  Filled 2013-05-13 (×24): qty 1

## 2013-05-13 MED ORDER — SORBITOL 70 % SOLN: 30.0000 mL | Freq: Every day | Status: DC | PRN

## 2013-05-13 MED ORDER — ACETAMINOPHEN 325 MG PO TABS
650.0000 mg | ORAL_TABLET | Freq: Four times a day (QID) | ORAL | Status: DC | PRN
  Administered 2013-05-17 – 2013-05-23 (×4): 650 mg via ORAL
  Filled 2013-05-13 (×5): qty 2

## 2013-05-13 MED ORDER — TRAMADOL HCL 50 MG PO TABS
50.0000 mg | ORAL_TABLET | Freq: Four times a day (QID) | ORAL | Status: DC | PRN
  Administered 2013-05-13 – 2013-05-16 (×4): 50 mg via ORAL
  Filled 2013-05-13 (×4): qty 1

## 2013-05-13 MED ORDER — INSULIN GLARGINE 100 UNIT/ML ~~LOC~~ SOLN
40.0000 [IU] | Freq: Two times a day (BID) | SUBCUTANEOUS | Status: DC
  Administered 2013-05-13: 10:00:00 40 [IU] via SUBCUTANEOUS
  Filled 2013-05-13 (×3): qty 0.4

## 2013-05-13 MED ORDER — HYDROCODONE-ACETAMINOPHEN 7.5-325 MG PO TABS
1.0000 | ORAL_TABLET | Freq: Four times a day (QID) | ORAL | Status: DC | PRN
  Administered 2013-05-14: 1 via ORAL
  Filled 2013-05-13: qty 1

## 2013-05-13 MED ORDER — ONDANSETRON HCL 4 MG PO TABS
4.0000 mg | ORAL_TABLET | Freq: Four times a day (QID) | ORAL | Status: DC | PRN
  Administered 2013-05-13 – 2013-05-16 (×6): 4 mg via ORAL
  Filled 2013-05-13 (×6): qty 1

## 2013-05-13 MED ORDER — INSULIN ASPART 100 UNIT/ML ~~LOC~~ SOLN: 5.0000 [IU] | Freq: Three times a day (TID) | SUBCUTANEOUS | Status: DC

## 2013-05-13 MED ORDER — HYDROCODONE-ACETAMINOPHEN 7.5-325 MG PO TABS: 1.0000 | ORAL_TABLET | Freq: Four times a day (QID) | ORAL | Status: DC | PRN

## 2013-05-13 MED ORDER — INSULIN GLARGINE 100 UNIT/ML ~~LOC~~ SOLN
40.0000 [IU] | Freq: Two times a day (BID) | SUBCUTANEOUS | Status: DC
  Administered 2013-05-13 – 2013-05-24 (×22): 40 [IU] via SUBCUTANEOUS
  Filled 2013-05-13 (×28): qty 0.4

## 2013-05-13 NOTE — Progress Notes (Signed)
Physical Therapy Treatment Patient Details Name: [name] [name] MRN: [id number] DOB: 1956-05-02 Today's Date: 05/13/2013 Time: 8657-8469 PT Time Calculation (min): 32 min  PT Assessment / Plan / Recommendation  History of Present Illness Patient is a 57 yo female admitted with Rt chronic foot ulcer.  Patient now s/p Rt BKA.  Patient also with uncontrolled DM.   PT Comments   Pt is progressing very well with her mobility today.  She needed less assist and is feeling more confident/less painful.  WC education and propulsion started and pt did well standing and pivoting to the [facility] with assist.  Pt is highly motivated to get better and do well and is an excellent CIR candidate.    Follow Up Recommendations  CIR     Does the patient have the potential to tolerate intense rehabilitation    Yes  Barriers to Discharge  None      Equipment Recommendations  Rolling walker with 5" wheels;Wheelchair (measurements PT);Wheelchair cushion (measurements PT)    Recommendations for Other Services Rehab consult  Frequency Min 4X/week   Progress towards PT Goals Progress towards PT goals: Progressing toward goals  Plan Current plan remains appropriate    Precautions / Restrictions Precautions Precautions: Fall Restrictions Weight Bearing Restrictions: No   Pertinent Vitals/Pain See vitals flow sheet.    Mobility  Bed Mobility Bed Mobility: Not assessed (pt seated EOB. ) Transfers Transfers: Sit to Stand;Stand to Sit;Stand Pivot Transfers Sit to Stand: 3: Mod assist;With upper extremity assist;With armrests;From bed;From chair/3-in-1 Stand to Sit: 3: Mod assist;With upper extremity assist;With armrests;To bed;To chair/3-in-1 Stand Pivot Transfers: 3: Mod assist;With armrests Details for Transfer Assistance: mod assist to support trunk while standing and pivoting to the WC.  Pt always positioned to pivot to the left towards her non-surgical leg.   Ambulation/Gait Ambulation/Gait Assistance:  Not tested (comment) Wheelchair Mobility Wheelchair Mobility: Yes Wheelchair Assistance: 5: Supervision Wheelchair Assistance Details (indicate cue type and reason): supervision and cues for technique.   Wheelchair Propulsion: Both upper extremities;Left lower extremity Wheelchair Parts Management: Needs assistance Distance: 120    Exercises General Exercises - Lower Extremity Quad Sets: AROM;Right;10 reps;Seated Long Arc Quad: AROM;10 reps;Seated Other Exercises Other Exercises: Encouraged self massage for pain and desensitization/phantom sensations in right residual limb.  I also spoke with pt: re the importance of hip extension for normal gait.       PT Goals (current goals can now be found in the care plan section) Acute Rehab PT Goals Patient Stated Goal: go home Potential to Achieve Goals: Poor  Visit Information  Last PT Received On: 05/13/13 Assistance Needed: +1 History of Present Illness: Patient is a 57 yo female admitted with Rt chronic foot ulcer.  Patient now s/p Rt BKA.  Patient also with uncontrolled DM.    Subjective Data  Subjective: Pt reports that she still feels as if her foot is still there.  Patient Stated Goal: go home   Cognition  Cognition Arousal/Alertness: Awake/alert Behavior During Therapy: WFL for tasks assessed/performed Overall Cognitive Status: Within Functional Limits for tasks assessed    Balance  Balance Balance Assessed: Yes Static Standing Balance Static Standing - Balance Support: Bilateral upper extremity supported Static Standing - Level of Assistance: 3: Mod assist Dynamic Standing Balance Dynamic Standing - Balance Support: Bilateral upper extremity supported Dynamic Standing - Level of Assistance: 3: Mod assist  End of Session PT - End of Session Equipment Utilized During Treatment: Gait belt Activity Tolerance: Patient limited by fatigue;Patient limited by  pain Patient left: in chair;with call bell/phone within reach      [place] B. Andrej Spagnoli, PT, DPT [phone]   05/13/2013, 3:30 PM

## 2013-05-13 NOTE — Progress Notes (Signed)
Rehab Admissions Coordinator Note:  Patient was screened by Trish Mage for appropriateness for an Inpatient Acute Rehab Consult.  At this time, an inpatient rehab consult has been ordered and is pending completion today.  Lelon Frohlich M 05/13/2013, 8:22 AM  I can be reached at [phone].

## 2013-05-13 NOTE — Progress Notes (Signed)
Pt transfer to 35M report called to RN. Pt to transfer via WC with belongings.

## 2013-05-13 NOTE — PMR Pre-admission (Signed)
PMR Admission Coordinator Pre-Admission Assessment  Patient: [name] [name] is an 57 y.o., female MRN: [id number] DOB: January 20, 1956 Height: 5\' 8"  (172.7 cm) Weight: 90.8 kg (200 lb 2.8 oz)              Insurance Information HMO: No     PPO:       PCP:       IPA:       80/20:       OTHER:   PRIMARY: Medicare A/B      Policy#: [id number]      Subscriber: [name] [name] CM Name:        Phone#:       Fax#:   Pre-Cert#:        Employer: Disabled Benefits:  Phone #:       Name: Armed forces technical officer. Date: A=10/04/01  B=05/06/02     Deduct: $1216      Out of Pocket Max: none      Life Max: unlimited CIR: 100%      SNF: 100 days Outpatient: 80%     Co-Pay: 20% Home Health: 100%      Co-Pay: none DME: 80%     Co-Pay: 20% Providers: patient's choice  Emergency Contact Information Contact Information   Name Relation Home Work Mobile   [name],[name] Spouse [phone]       Current Medical History  Patient Admitting Diagnosis:  R BKA  History of Present Illness: A 57 y.o. right-handed female with history of uncontrolled diabetes mellitus as well as peripheral vascular disease. Presented 05/09/2013 with nonhealing right foot ulcer as well as fever. Patient had reportedly stepped on a piece of glass back in September of 2014 with poor wound healing. She had been undergoing wound care by podiatry since September. MRI of the right foot showed marked soft tissue swelling and air in the plantar surface of the right foot consistent with infection. 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 Lajoyce Corners. Postoperative pain management. Subcutaneous heparin added for DVT prophylaxis. Hemoglobin A1c of 10.8 with insulin therapy as directed. Physical therapy evaluation completed 05/12/2013 with recommendations for physical medicine rehabilitation consult to consider inpatient rehabilitation services.Patient was felt to be a good candidate for inpatient rehabilitation  services and will be admitted for a comprehensive rehabilitation program.    Past Medical History  Past Medical History  Diagnosis Date  . Dizziness   . Fatigue   . Swelling   . Depression   . Allergy     sinus problems  . IBS (irritable bowel syndrome)   . GERD (gastroesophageal reflux disease)   . Neuromuscular disorder   . Ulcer   . Complication of anesthesia     "[name]" (05/09/2013)  . PONV (postoperative nausea and vomiting)   . Poor circulation   . Varicose vein of leg   . Type II diabetes mellitus   . Migraines     "hits me here and there" (05/09/2013)  . Arthritis     "both hands"   . Charcot's joint of foot     "both feet"   . Fibromyalgia   . Chronic lower back pain     "2 cracked vertebra; 3 herniated discs" (05/09/2013)    Family History  family history includes Cancer in her father.  Prior Rehab/Hospitalizations:  Had been going to [facility]" for therapy PTA   Current Medications  Current facility-administered medications:acetaminophen (TYLENOL) suppository 650 mg,  650 mg, Rectal, Q6H PRN, Maryann Mikhail, DO;  acetaminophen (TYLENOL) tablet 650 mg, 650 mg, Oral, Q6H PRN, Maryann Mikhail, DO, 650 mg at 05/10/13 1240;  aspirin chewable tablet 324 mg, 324 mg, Oral, Daily, Maryann Mikhail, DO, 324 mg at 05/13/13 1026;  carvedilol (COREG) tablet 3.125 mg, 3.125 mg, Oral, BID WC, Leroy Sea, MD, 3.125 mg at 05/13/13 0750 diazepam (VALIUM) tablet 2 mg, 2 mg, Oral, Q6H PRN, Maryann Mikhail, DO, 2 mg at 05/12/13 2321;  diphenhydrAMINE (BENADRYL) capsule 25 mg, 25 mg, Oral, Q6H PRN, Rolan Lipa, NP, 25 mg at 05/12/13 2135;  feeding supplement (GLUCERNA SHAKE) (GLUCERNA SHAKE) liquid 237 mL, 237 mL, Oral, BID BM, Earna Coder, RD, 237 mL at 05/13/13 1027 heparin injection 5,000 Units, 5,000 Units, Subcutaneous, Q8H, Maryann Mikhail, DO, 5,000 Units at 05/13/13 0503;  HYDROcodone-acetaminophen (NORCO) 7.5-325 MG per  tablet 1 tablet, 1 tablet, Oral, Q6H PRN, Leroy Sea, MD;  insulin aspart (novoLOG) injection 0-15 Units, 0-15 Units, Subcutaneous, TID WC, Leroy Sea, MD, 11 Units at 05/13/13 0809 insulin aspart (novoLOG) injection 0-5 Units, 0-5 Units, Subcutaneous, QHS, Leroy Sea, MD, 3 Units at 05/12/13 2134;  insulin aspart (novoLOG) injection 5 Units, 5 Units, Subcutaneous, TID WC, Leroy Sea, MD, 5 Units at 05/13/13 0809;  insulin glargine (LANTUS) injection 40 Units, 40 Units, Subcutaneous, BID, Leroy Sea, MD, 40 Units at 05/13/13 1026 lisinopril (PRINIVIL,ZESTRIL) tablet 10 mg, 10 mg, Oral, Daily, Leroy Sea, MD, 10 mg at 05/13/13 1026;  metoCLOPramide (REGLAN) injection 5-10 mg, 5-10 mg, Intravenous, Q8H PRN, Nadara Mustard, MD;  metoCLOPramide (REGLAN) tablet 5-10 mg, 5-10 mg, Oral, Q8H PRN, Nadara Mustard, MD;  morphine 2 MG/ML injection 1-2 mg, 1-2 mg, Intravenous, Q2H PRN, Nadara Mustard, MD, 2 mg at 05/13/13 0453 ondansetron [facility]) injection 4 mg, 4 mg, Intravenous, Q6H PRN, Rolan Lipa, NP, 4 mg at 05/11/13 0543;  ondansetron [facility]) injection 4 mg, 4 mg, Intravenous, Q6H PRN, Nadara Mustard, MD;  ondansetron [facility]) tablet 4 mg, 4 mg, Oral, Q6H PRN, Nadara Mustard, MD;  traMADol Janean Sark) tablet 50 mg, 50 mg, Oral, Q6H PRN, Maryann Mikhail, DO, 50 mg at 05/10/13 2051  Patients Current Diet: Carb Control  Precautions / Restrictions Precautions Precautions: Fall Restrictions Weight Bearing Restrictions: No   Prior Activity Level Limited Community (1-2x/wk): Went out to MD appointments  Home Assistive Devices / Equipment Home Assistive Devices/Equipment: Dentures (specify type);Cane (specify quad or straight) Home Equipment: Cane - single point;Grab bars - tub/shower  Prior Functional Level Prior Function Level of Independence: Independent with assistive device(s)  Current Functional Level Cognition  Overall Cognitive Status: Within Functional  Limits for tasks assessed Orientation Level: Oriented X4    Extremity Assessment (includes Sensation/Coordination)          ADLs       Mobility  Bed Mobility: Supine to Sit;Sitting - Scoot to Edge of Bed;Sit to Supine Supine to Sit: 3: Mod assist;With rails;HOB elevated Sitting - Scoot to Edge of Bed: 4: Min assist;With rail Sit to Supine: 4: Min assist;With rail;HOB elevated    Transfers  Transfers: Sit to Stand Sit to Stand: 2: Max assist;With upper extremity assist;From bed    Ambulation / Gait / Stairs / Psychologist, prison and probation services  Ambulation/Gait Ambulation/Gait Assistance: Not tested (comment)    Posture / Balance Static Sitting Balance Static Sitting - Balance Support: No upper extremity supported;Feet unsupported Static Sitting - Level of Assistance: 5: Stand by assistance  Static Sitting - Comment/# of Minutes: 10    Special needs/care consideration BiPAP/CPAP No CPM No Continuous Drip IV No Dialysis No         Life Vest No Oxygen No Special Bed No Trach Size No Wound Vac (area) No      Skin Has R BKA incision with dressing                             Bowel mgmt: Had BM 05/13/13 Bladder mgmt: Voiding up on BSC Diabetic mgmt Yes, on insulin, having difficulty getting meds due to lack of Medicare part D coverage previously    Previous Home Environment Living Arrangements: Alone (Patient with estranged husband - taking care of her animals ) Available Help at Discharge: Family;Available PRN/intermittently Type of Home: House Home Layout: One level Home Access: Ramped entrance Home Care Services: No  Discharge Living Setting Plans for Discharge Living Setting: Patient's home;House;Lives with (comment) (Husband recently moved back home.) Type of Home at Discharge: House Discharge Home Layout: One level Discharge Home Access: Ramped entrance (Has a homemade ramp.) Does the patient have any problems obtaining your medications?: Yes (Describe)  (insulin)  Social/Family/Support Systems Patient Roles: Spouse;Parent (Restraining order against daughter.) Contact Information: [name] Programmer, systems - spouse Anticipated Caregiver: self and husband at night Anticipated Caregiver's Contact Information: [name] [name] - [phone] Ability/Limitations of Caregiver: Husband works days.  He will be at home at night. Caregiver Availability: Evenings only Discharge Plan Discussed with Primary Caregiver: Yes Is Caregiver In Agreement with Plan?: Yes Does Caregiver/Family have Issues with Lodging/Transportation while Pt is in Rehab?: No  Goals/Additional Needs Patient/Family Goal for Rehab: PT/OT mod I goals, no ST needs Expected length of stay: 10-14 days Cultural Considerations: None Dietary Needs: Carb mod med calorie, thin liquids Equipment Needs: TBD Pt/Family Agrees to Admission and willing to participate: Yes Program Orientation Provided & Reviewed with Pt/Caregiver Including Roles  & Responsibilities: Yes   Decrease burden of Care through IP rehab admission: N/A  Possible need for SNF placement upon discharge: Not planned   Patient Condition: This patient's condition remains as documented in the consult dated 05/13/13, in which the Rehabilitation Physician determined and documented that the patient's condition is appropriate for intensive rehabilitative care in an inpatient rehabilitation facility. Will admit to inpatient rehab today.  Preadmission Screen Completed By:  Trish Mage, 05/13/2013 11:53 AM ______________________________________________________________________   Discussed status with Dr. Riley Kill on 05/13/13 at 1201 and received telephone approval for admission today.  Admission Coordinator:  Trish Mage, time1202/Date12/08/14

## 2013-05-13 NOTE — Evaluation (Signed)
Occupational Therapy Evaluation Patient Details Name: [name] [name] MRN: [id number] DOB: March 24, 1956 Today's Date: 05/13/2013 Time: 9562-1308 OT Time Calculation (min): 31 min  OT Assessment / Plan / Recommendation History of present illness Patient is a 57 yo female admitted with Rt chronic foot ulcer.  Patient now s/p Rt BKA.  Patient also with uncontrolled DM.     OT Assessment  All further OT needs can be met in the next venue of care Pt. Presents with recent hospitalization for R BKA. Pt. Presents with decreased balance while sitting and in standing. Pt. Has decreased ability to preform ADL's and mobility and would benefit form inpt. Rehab stay.    Follow Up Recommendations       Barriers to Discharge      Equipment Recommendations       Recommendations for Other Services    Frequency       Precautions / Restrictions Precautions Precautions: Fall Restrictions Weight Bearing Restrictions: No   Pertinent Vitals/Pain     ADL  Eating/Feeding: Simulated;Independent Where Assessed - Eating/Feeding: Edge of bed Grooming: Simulated;Set up;Wash/dry hands;Wash/dry face;Brushing hair Where Assessed - Grooming: Unsupported sitting Upper Body Bathing: Simulated;Set up Where Assessed - Upper Body Bathing: Unsupported sitting Lower Body Bathing: Moderate assistance Where Assessed - Lower Body Bathing: Unsupported sitting;Lean right and/or left Upper Body Dressing: Set up Where Assessed - Upper Body Dressing: Unsupported sitting Lower Body Dressing: Moderate assistance Where Assessed - Lower Body Dressing: Unsupported sitting;Lean right and/or left ADL Comments: Pt. deferred OOB activity secondary to dizzyness    OT Diagnosis:    OT Problem List:   OT Treatment Interventions:     OT Goals(Current goals can be found in the care plan section) Acute Rehab OT Goals Patient Stated Goal: go home  Visit Information  Assistance Needed: +2 History of Present Illness: Patient is a  57 yo female admitted with Rt chronic foot ulcer.  Patient now s/p Rt BKA.  Patient also with uncontrolled DM.       Prior Functioning     Home Living Family/patient expects to be discharged to:: Inpatient rehab Living Arrangements: Alone Available Help at Discharge: Family;Available PRN/intermittently Type of Home: House Home Access: Ramped entrance Home Layout: One level Home Equipment: Cane - single point;Grab bars - tub/shower Prior Function Level of Independence: Independent with assistive device(s) Comments: was having difficulty secondary to decreased WB status  Communication Communication: No difficulties         Vision/Perception Vision - History Baseline Vision: Wears glasses all the time Visual History:  (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 Pt. is legally blind)   Cognition  Cognition Arousal/Alertness: Awake/alert Behavior During Therapy: WFL for tasks assessed/performed Overall Cognitive Status: Within Functional Limits for tasks assessed    Extremity/Trunk Assessment Upper Extremity Assessment Upper Extremity Assessment: Generalized weakness     Mobility       Exercise     Balance     End of Session OT - End of Session Activity Tolerance: Patient tolerated treatment well Patient left: in bed;with call bell/phone within reach  GO     Jaycob Mcclenton 05/13/2013, 2:05 PM

## 2013-05-13 NOTE — Discharge Summary (Signed)
[employer], is a 57 y.o. female  DOB [birth year]  MRN [id number].  Admission date:  05/09/2013  Admitting Physician  Edsel Petrin, DO  Discharge Date:  05/13/2013   Primary MD  Bufford Spikes, NP  Recommendations for primary care physician for things to follow:   Glycemic control and adjust insulin as needed   Admission Diagnosis  Cellulitis [682.9] Diabetic foot ulcer [250.80, 707.15] Diabetic foot infection [250.80, 686.9]  Discharge Diagnosis   right foot diabetic ulcer cellulitis with clinical osteoarthritis requiring right BKA  Active Problems:   Diabetic foot infection   Sepsis   Accelerated hypertension   Depression      Past Medical History  Diagnosis Date  . Dizziness   . Fatigue   . Swelling   . Depression   . Allergy     sinus problems  . IBS (irritable bowel syndrome)   . GERD (gastroesophageal reflux disease)   . Neuromuscular disorder   . Ulcer   . Complication of anesthesia     "[name]" (05/09/2013)  . PONV (postoperative nausea and vomiting)   . Poor circulation   . Varicose vein of leg   . Type II diabetes mellitus   . Migraines     "hits me here and there" (05/09/2013)  . Arthritis     "both hands"   . Charcot's joint of foot     "both feet"   . Fibromyalgia   . Chronic lower back pain     "2 cracked vertebra; 3 herniated discs" (05/09/2013)    Past Surgical History  Procedure Laterality Date  . Cesarean section  1980; 1996  . Bunionectomy Right   . Cholecystectomy    . Tubal ligation  1996  . Retinal detachment surgery Left ~ 2012  . Varicose vein surgery Left ~ 2003    "it was full of blood clots" (05/09/2013)     Discharge Condition: Stable   Follow-up Information   Follow up with Bufford Spikes, NP On 06/10/2013. (10:30 has apt scheduled already.)    Contact information:   [facility] [address] [place] [address] [phone]       Follow up with DUDA,MARCUS V, MD In 2 weeks.   Specialty:  Orthopedic Surgery   Contact information:   [address] [place] [place] [address] [phone]       Follow up with Bufford Spikes, NP. Schedule an appointment as soon as possible for a visit in 3 days.   Contact information:   [facility] [address]  [place] [address] [phone]         Consults obtained - Dr. Lajoyce Corners orthopedics   Discharge Medications      Medication List    STOP taking these medications       ciprofloxacin 500 MG tablet  Commonly known as:  CIPRO     clindamycin 150 MG capsule  Commonly known  as:  CLEOCIN      TAKE these medications       aspirin 81 MG tablet  Take 324 mg by mouth daily.     diazepam 2 MG tablet  Commonly known as:  VALIUM  Take 2 mg by mouth every 6 (six) hours as needed for anxiety.     HYDROcodone-acetaminophen 7.5-325 MG per tablet  Commonly known as:  NORCO  Take 1-2 tablets by mouth every 6 (six) hours as needed for moderate pain.     insulin aspart 100 UNIT/ML injection  Commonly known as:  novoLOG  Inject 5 Units into the skin 3 (three) times daily with meals.     insulin aspart 100 UNIT/ML injection  Commonly known as:  novoLOG  - Before each meal 3 times a day, 140-199 - 2 units, 200-250 - 4 units, 251-299 - 6 units,  300-349 - 8 units,  350 or above 10 units.  - Insulin PEN if approved, provide syringes and needles if needed.     insulin glargine 100 UNIT/ML injection  Commonly known as:  LANTUS  Inject 0.4 mLs (40 Units total) into the skin 2 (two) times daily.     lisinopril 10 MG tablet  Commonly known as:  PRINIVIL,ZESTRIL  Take 1 tablet (10 mg total) by mouth daily.     TraMADol HCl 50 MG Tbdp  Take 50 mg by mouth every 6 (six) hours as needed (pain).         Diet and Activity recommendation: See Discharge Instructions  below   Discharge Instructions     Follow with Primary MD Bufford Spikes, NP in 7 days   Get CBC, CMP, checked 7 days by Primary MD and again as instructed by your Primary MD. Get a 2 view Chest X ray done next visit if you had Pneumonia of Lung problems at the Hospital.  Get Medicines reviewed and adjusted.  Please request your Prim.MD to go over all Hospital Tests and Procedure/Radiological results at the follow up, please get all Hospital records sent to your Prim MD by signing hospital release before you go home.  Activity: As tolerated with Full fall precautions use walker/cane & assistance as needed   Diet:  Heart healthy low carbohydrate  For Heart failure patients - Check your Weight same time everyday, if you gain over 2 pounds, or you develop in leg swelling, experience more shortness of breath or chest pain, call your Primary MD immediately. Follow Cardiac Low Salt Diet and 1.8 lit/day fluid restriction.  Disposition rehabilitation  If you experience worsening of your admission symptoms, develop shortness of breath, life threatening emergency, suicidal or homicidal thoughts you must seek medical attention immediately by calling 911 or calling your MD immediately  if symptoms less severe.  You Must read complete instructions/literature along with all the possible adverse reactions/side effects for all the Medicines you take and that have been prescribed to you. Take any new Medicines after you have completely understood and accpet all the possible adverse reactions/side effects.   Do not drive and provide baby sitting services if your were admitted for syncope or siezures until you have seen by Primary MD or a Neurologist and advised to do so again.  Do not drive when taking Pain medications.    Do not take more than prescribed Pain, Sleep and Anxiety Medications  Special Instructions: If you have smoked or chewed Tobacco  in the last 2 yrs please stop smoking, stop  any regular Alcohol  and or any Recreational drug use.  Wear Seat belts while driving.   Please note  You were cared for by a hospitalist during your hospital stay. If you have any questions about your discharge medications or the care you received while you were in the hospital after you are discharged, you can call the unit and asked to speak with the hospitalist on call if the hospitalist that took care of you is not available. Once you are discharged, your primary care physician will handle any further medical issues. Please note that NO REFILLS for any discharge medications will be authorized once you are discharged, as it is imperative that you return to your primary care physician (or establish a relationship with a primary care physician if you do not have one) for your aftercare needs so that they can reassess your need for medications and monitor your lab values.   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 Georgina Peer Foot Complete Right  05/09/2013   CLINICAL DATA:  Recurrent skin infection with ulcer on the plantar surface of the right foot.  EXAM: RIGHT FOOT COMPLETE - 3+ VIEW  COMPARISON:  MRI of foot April 18, 2013  FINDINGS: There is no evidence of fracture or dislocation. There are degenerative joint changes of the midfoot. There is plantar calcaneal spur. Postsurgical changes are identified in the distal 1st metatarsal. There are extensive soft tissue swelling and air in the plantar surface of the foot. There is no bony destruction to suggest osteomyelitis.  IMPRESSION: Marked soft tissue swelling and air in the plantar surface of the right foot consistent with infection. No bony destruction to suggest osteomyelitis.   Electronically Signed   By: Sherian Rein M.D.   On: 05/09/2013 18:12    Micro Results      Recent Results (from the past 240 hour(s))  SURGICAL PCR SCREEN      Status: None   Collection Time    05/10/13 11:47 PM      Result Value Range Status   MRSA, PCR NEGATIVE  NEGATIVE Final   Staphylococcus aureus NEGATIVE  NEGATIVE Final   Comment:            The Xpert SA Assay (FDA     approved for NASAL specimens     in patients over 64 years of age),     is one component of     a comprehensive surveillance     program.  Test performance has     been validated by [employer] for patients greater     than or equal to 58 year old.     It is not intended     to diagnose infection nor to     guide or monitor treatment.     History of present illness and  Hospital Course:     Kindly see H&P for history of present illness and admission details, please review complete Labs, Consult reports and Test reports for all details in brief Briarrose Proud, is a 57 y.o. female, patient with history of poorly controlled insulin-dependent type 2 diabetes mellitus with A1c of 10.8, essential hypertension, diabetic neuropathy, anxiety was admitted to the hospital with chief complaints of right foot diabetic ulcer and cellulitis along with clinical suspicion of osteomyelitis, she had failed outpatient antibiotic treatment and had been having issues with infection and  ulcers for the last few months. She was started here on empiric IV antibiotics, she was seen by orthopedic physician Dr. 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 She received 24 more hours of empiric IV antibiotics after which antibiotics were stopped as amputation margins were clean and infection free. Plan was discussed with Dr. Lajoyce Corners patient will now go for rehabilitation to inpatient rehabilitation at [facility].   Kindly noted patient's diabetes mellitus type 2 which is insulin-dependent was in poor control in the outpatient setting her A1c was 10.8, patient was on Lantus 24 units each bedtime along with sliding scale insulin with meals,  her Lantus has been increased to 40 units twice a day, longer scheduled pre meal insulin and sliding scale. Will request a rehabilitation staff to continue monitoring her sugars q. a.c. at bedtime and adjust insulin dose as required.    She has history of depression and anxiety. She is doing fairly well on low-dose Valium. Outpatient psych input can be considered. She's not suicidal homicidal.     Today   Subjective:   [place] today has no headache,no chest abdominal pain,no new weakness tingling or numbness, feels much better wants to go to the rehabilitation.  Objective:   Blood pressure 147/79, pulse 90, temperature 98.8 F (37.1 C), temperature source Oral, resp. rate 17, height 5\' 8"  (1.727 m), weight 90.8 kg (200 lb 2.8 oz), SpO2 93.00%.   Intake/Output Summary (Last 24 hours) at 05/13/13 1227 Last data filed at 05/13/13 0900  Gross per 24 hour  Intake   1220 ml  Output      0 ml  Net   1220 ml    Exam Awake Alert, Oriented *3, No new F.N deficits, Normal affect [place].AT,PERRAL Supple Neck,No JVD, No cervical lymphadenopathy appriciated.  Symmetrical Chest wall movement, Good air movement bilaterally, CTAB RRR,No Gallops,Rubs or new Murmurs, No Parasternal Heave +ve B.Sounds, Abd Soft, Non tender, No organomegaly appriciated, No rebound -guarding or rigidity. No Cyanosis, Clubbing or edema, No new Rash or bruise, right BK stump under bandage site appears stable  Data Review   CBC w Diff: Lab Results  Component Value Date   WBC 11.3* 05/10/2013   HGB 10.7* 05/10/2013   HCT 30.9* 05/10/2013   PLT 203 05/10/2013    CMP: Lab Results  Component Value Date   NA 138 05/11/2013   K 4.1 05/11/2013   CL 101 05/11/2013   CO2 27 05/11/2013   BUN 7 05/11/2013   CREATININE 0.42* 05/11/2013   PROT 7.3 05/09/2013   ALBUMIN 3.2* 05/09/2013   BILITOT 0.7 05/09/2013   ALKPHOS 85 05/09/2013   AST 13 05/09/2013   ALT 8 05/09/2013  . Lab Results  Component Value Date   HGBA1C  10.8* 05/10/2013     Total Time in preparing paper work, data evaluation and todays exam - 35 minutes  Leroy Sea M.D on 05/13/2013 at 12:27 PM  [facility] Office  [phone]

## 2013-05-13 NOTE — H&P (Signed)
Physical Medicine and Rehabilitation Admission H&P  Chief Complaint   Patient presents with   .  Recurrent Skin Infections   :  Chief complaint: leg pain  HPI: [name] [name] is a 57 y.o. right-handed female with history of uncontrolled diabetes mellitus as well as peripheral vascular disease. Presented 05/09/2013 with nonhealing right foot ulcer as well as fever. Patient had reportedly stepped on a piece of glass back in September of 2014 with poor wound healing. She had been undergoing wound care by podiatry since September. MRI of the right foot showed marked soft tissue swelling and air in the plantar surface of the right foot consistent with infection. 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 Lajoyce Corners. Postoperative pain management. Subcutaneous heparin added for DVT prophylaxis. Hemoglobin A1c of 10.8 with insulin therapy as directed. Physical therapy evaluation completed 05/12/2013 with recommendations for physical medicine rehabilitation consult to consider inpatient rehabilitation services.Patient was felt to be a good candidate for inpatient rehabilitation services and was admitted for a comprehensive rehabilitation program    ROS Review of Systems  Constitutional: Positive for malaise/fatigue.  Gastrointestinal:  GERD  Musculoskeletal: Positive for back pain, joint pain and myalgias.  Neurological: Positive for dizziness and headaches.  Psychiatric/Behavioral: Positive for depressionanxiety.  All other systems reviewed and are negative     Past Medical History   Diagnosis  Date   .  Dizziness    .  Fatigue    .  Swelling    .  Depression    .  Allergy      sinus problems   .  IBS (irritable bowel syndrome)    .  GERD (gastroesophageal reflux disease)    .  Neuromuscular disorder    .  Ulcer    .  Complication of anesthesia      "[name]" (05/09/2013)   .  PONV (postoperative nausea and vomiting)     .  Poor circulation    .  Varicose vein of leg    .  Type II diabetes mellitus    .  Migraines      "hits me here and there" (05/09/2013)   .  Arthritis      "both hands"   .  Charcot's joint of foot      "both feet"   .  Fibromyalgia    .  Chronic lower back pain      "2 cracked vertebra; 3 herniated discs" (05/09/2013)    Past Surgical History   Procedure  Laterality  Date   .  Cesarean section   1980; 1996   .  Bunionectomy  Right    .  Cholecystectomy     .  Tubal ligation   1996   .  Retinal detachment surgery  Left  ~ 2012   .  Varicose vein surgery  Left  ~ 2003     "it was full of blood clots" (05/09/2013)    Family History   Problem  Relation  Age of Onset   .  Cancer  Father     Social History: reports that she has quit smoking. Her smoking use included Cigarettes. She has a 3.75 pack-year smoking history. She has never used smokeless tobacco. She reports that she does not drink alcohol or use illicit drugs.  Allergies:  Allergies   Allergen  Reactions   .  Codeine  Anaphylaxis and Hives   .  Darvocet [Propoxyphene-Acetaminophen]  Anaphylaxis   .  Demerol [Meperidine]  Anaphylaxis   .  Ibuprofen  Anaphylaxis   .  Iodine  Anaphylaxis   .  Naproxen  Anaphylaxis and Hives   .  Penicillins  Anaphylaxis   .  Sulfa Antibiotics  Nausea And Vomiting   .  Other      Bee stings and medication in the cain family   .  Tape      Adhesive tapes    Medications Prior to Admission   Medication  Sig  Dispense  Refill   .  aspirin 81 MG tablet  Take 324 mg by mouth daily.     .  ciprofloxacin (CIPRO) 500 MG tablet  Take 1 tablet (500 mg total) by mouth 2 (two) times daily.  20 tablet  2   .  clindamycin (CLEOCIN) 150 MG capsule  Take 150 mg by mouth 3 (three) times daily. 04-26-13 for 30 days.     .  diazepam (VALIUM) 2 MG tablet  Take 2 mg by mouth every 6 (six) hours as needed for anxiety.     .  insulin glargine (LANTUS) 100 UNIT/ML injection  Inject 24 Units into the skin  at bedtime.     .  TraMADol HCl 50 MG TBDP  Take 50 mg by mouth every 6 (six) hours as needed (pain).      Home:  Home Living  Family/patient expects to be discharged to:: Inpatient rehab  Living Arrangements: Alone (Patient with estranged husband - taking care of her animals )  Available Help at Discharge: Family;Available PRN/intermittently  Type of Home: House  Home Access: Ramped entrance  Home Layout: One level  Home Equipment: Cane - single point;Grab bars - tub/shower  Functional History:   Functional Status:  Mobility:  Bed Mobility  Bed Mobility: Supine to Sit;Sitting - Scoot to [place] of Bed;Sit to Supine  Supine to Sit: 3: Mod assist;With rails;HOB elevated  Sitting - Scoot to Edge of Bed: 4: Min assist;With rail  Sit to Supine: 4: Min assist;With rail;HOB elevated  Transfers  Transfers: Sit to Stand  Sit to Stand: 2: Max assist;With upper extremity assist;From bed  Ambulation/Gait  Ambulation/Gait Assistance: Not tested (comment)   ADL:   Cognition:  Cognition  Overall Cognitive Status: Within Functional Limits for tasks assessed  Orientation Level: Oriented X4  Cognition  Arousal/Alertness: Awake/alert  Behavior During Therapy: Anxious  Overall Cognitive Status: Within Functional Limits for tasks assessed    Physical Exam:  Blood pressure 147/79, pulse 90, temperature 98.8 F (37.1 C), temperature source Oral, resp. rate 17, height 5\' 8"  (1.727 m), weight 90.8 kg (200 lb 2.8 oz), SpO2 93.00%.  Constitutional: She is oriented to person, place, and time.  HENT:  Head: Normocephalic and atraumatic.  Right Ear: External ear normal.  Left Ear: External ear normal.  Eyes: EOM are normal.  Neck: Normal range of motion. Neck supple. No JVD present. No tracheal deviation present. No thyromegaly present.  Cardiovascular: Normal rate and regular rhythm. No murmurs or rubs Respiratory: Effort normal and breath sounds normal. No respiratory distress.  GI: Soft. Bowel  sounds are normal. She exhibits no distension.  Musculoskeletal:  Right leg in post-op, coban dressing. Leg  tender  Lymphadenopathy:  She has no cervical adenopathy.  Neurological: She is alert and oriented to person, place, and time. She displays normal reflexes. No cranial nerve deficit. Coordination normal.  Unable to lift right leg agst gravity. Minimal sensory loss  left foot.  Skin:  Postoperative dressing to BKA site. No visible drainage. Remaining skin intact Psychiatric: She has a normal mood and affect. Her behavior is normal. Judgment and thought content normal  Results for orders placed during the hospital encounter of 05/09/13 (from the past 48 hour(s))   GLUCOSE, CAPILLARY Status: Abnormal    Collection Time    05/11/13 11:51 AM   Result  Value  Range    Glucose-Capillary  296 (*)  70 - 99 mg/dL   GLUCOSE, CAPILLARY Status: Abnormal    Collection Time    05/11/13 4:52 PM   Result  Value  Range    Glucose-Capillary  404 (*)  70 - 99 mg/dL   GLUCOSE, CAPILLARY Status: Abnormal    Collection Time    05/11/13 9:05 PM   Result  Value  Range    Glucose-Capillary  411 (*)  70 - 99 mg/dL    Comment 1  Notify RN    GLUCOSE, CAPILLARY Status: Abnormal    Collection Time    05/12/13 2:07 AM   Result  Value  Range    Glucose-Capillary  335 (*)  70 - 99 mg/dL    Comment 1  Notify RN    GLUCOSE, CAPILLARY Status: Abnormal    Collection Time    05/12/13 7:47 AM   Result  Value  Range    Glucose-Capillary  369 (*)  70 - 99 mg/dL   GLUCOSE, CAPILLARY Status: Abnormal    Collection Time    05/12/13 12:07 PM   Result  Value  Range    Glucose-Capillary  300 (*)  70 - 99 mg/dL   GLUCOSE, CAPILLARY Status: Abnormal    Collection Time    05/12/13 5:33 PM   Result  Value  Range    Glucose-Capillary  306 (*)  70 - 99 mg/dL   GLUCOSE, CAPILLARY Status: Abnormal    Collection Time    05/12/13 9:10 PM   Result  Value  Range    Glucose-Capillary  292 (*)  70 - 99 mg/dL     Comment 1  Notify RN    GLUCOSE, CAPILLARY Status: Abnormal    Collection Time    05/13/13 7:57 AM   Result  Value  Range    Glucose-Capillary  341 (*)  70 - 99 mg/dL    No results found.  Post Admission Physician Evaluation:  1. Functional deficits secondary to right BKA. 2. Patient is admitted to receive collaborative, interdisciplinary care between the physiatrist, rehab nursing staff, and therapy team. 3. Patient's level of medical complexity and substantial therapy needs in context of that medical necessity cannot be provided at a lesser intensity of care such as a SNF. 4. Patient has experienced substantial functional loss from his/her baseline which was documented above under the "Functional History" and "Functional Status" headings. Judging by the patient's diagnosis, physical exam, and functional history, the patient has potential for functional progress which will result in measurable gains while on inpatient rehab. These gains will be of substantial and practical use upon discharge in facilitating mobility and self-care at the household level. 5. Physiatrist will provide 24 hour management of medical needs as well as oversight of the therapy plan/treatment and provide guidance as appropriate regarding the interaction of the two. 6. 24 hour rehab nursing will assist with bladder management, bowel management, safety, skin/wound care, disease management, medication administration, pain management and patient education and help integrate therapy concepts, techniques,education, etc. 7. PT will assess  and treat for/with: Lower extremity strength, range of motion, stamina, balance, functional mobility, safety, adaptive techniques and equipment, pain mgt, pre-prosthetic ed. Goals are: mod I. 8. OT will assess and treat for/with: ADL's, functional mobility, safety, upper extremity strength, adaptive techniques and equipment, pain mgt, education. Goals are: mod I. 9. SLP will assess and treat  for/with: n/a. Goals are: n/a. 10. Case Management and Social Worker will assess and treat for psychological issues and discharge planning. 11. Team conference will be held weekly to assess progress toward goals and to determine barriers to discharge. 12. Patient will receive at least 3 hours of therapy per day at least 5 days per week. 13. ELOS: 10-13 days  14. Prognosis: excellent Medical Problem List and Plan:  1.Right BKA  2. DVT Prophylaxis/Anticoagulation: Subcutaneous heparin. Monitor platelet counts and any signs of bleeding  3. Pain Management:tramadol 50 mg every 6 as needed moderate pain Hydrocodone1 tab every 6 as needed as needed. Monitor with increased mobility and  4. Mood: Valium 2 mg every 6 as needed anxiety. Provide emotional support  5. Neuropsych: This patient is capable of making decisions on her own behalf.  6.Diabetes mellitus with peripheral neuropathy.Hemoglobin A1c 10.8.NovoLog 5 units 3 times a day, Lantus insulin 40 units twice a day. Check blood sugars a.c. And at bedtime  7.Hypertension.: Coreg 3.125 mg twice a day,lisinopril 10 mg daily. Monitor with increased mobility  8.Tobacco abuse. Counseling   Ranelle Oyster, MD, [facility] Physical Medicine & Rehabilitation   05/13/2013

## 2013-05-13 NOTE — Progress Notes (Signed)
Patient ID: Leisure centre manager, female   DOB: Dec 14, 1955, 57 y.o.   MRN: [id number] Postoperative day 2 right transtibial amputation. Patient states she feels better now the infected foot has been removed. Anticipate she would benefit from inpatient therapy. Patient was given instructions to work on knee extension. I will followup in the office in 2 weeks.

## 2013-05-13 NOTE — Consult Note (Signed)
Physical Medicine and Rehabilitation Consult Reason for Consult: Right BKA Referring Physician: [facility]   HPI: [name] [name] is a 57 y.o. right-handed female with history of uncontrolled diabetes mellitus as well as peripheral vascular disease. Presented 05/09/2013 with nonhealing right foot ulcer as well as fever. Patient had reportedly stepped on a piece of glass back in September of 2014 with poor wound healing. She had been undergoing wound care by podiatry since September. MRI of the right foot showed marked soft tissue swelling and air in the plantar surface of the right foot consistent with infection. 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 Lajoyce Corners. Postoperative pain management. Subcutaneous heparin added for DVT prophylaxis. Hemoglobin A1c of 10.8 with insulin therapy as directed. Physical therapy evaluation completed 05/12/2013 with recommendations for physical medicine rehabilitation consult to consider inpatient rehabilitation services.   Review of Systems  Constitutional: Positive for malaise/fatigue.  Gastrointestinal:       GERD  Musculoskeletal: Positive for back pain, joint pain and myalgias.  Neurological: Positive for dizziness and headaches.  Psychiatric/Behavioral: Positive for depression.  All other systems reviewed and are negative.   Past Medical History  Diagnosis Date  . Dizziness   . Fatigue   . Swelling   . Depression   . Allergy     sinus problems  . IBS (irritable bowel syndrome)   . GERD (gastroesophageal reflux disease)   . Neuromuscular disorder   . Ulcer   . Complication of anesthesia     "[name]" (05/09/2013)  . PONV (postoperative nausea and vomiting)   . Poor circulation   . Varicose vein of leg   . Type II diabetes mellitus   . Migraines     "hits me here and there" (05/09/2013)  . Arthritis     "both hands"   . Charcot's joint of foot     "both feet"   .  Fibromyalgia   . Chronic lower back pain     "2 cracked vertebra; 3 herniated discs" (05/09/2013)   Past Surgical History  Procedure Laterality Date  . Cesarean section  1980; 1996  . Bunionectomy Right   . Cholecystectomy    . Tubal ligation  1996  . Retinal detachment surgery Left ~ 2012  . Varicose vein surgery Left ~ 2003    "it was full of blood clots" (05/09/2013)   Family History  Problem Relation Age of Onset  . Cancer Father    Social History:  reports that she has quit smoking. Her smoking use included Cigarettes. She has a 3.75 pack-year smoking history. She has never used smokeless tobacco. She reports that she does not drink alcohol or use illicit drugs. Allergies:  Allergies  Allergen Reactions  . Codeine Anaphylaxis and Hives  . Darvocet [Propoxyphene-Acetaminophen] Anaphylaxis  . Demerol [Meperidine] Anaphylaxis  . Ibuprofen Anaphylaxis  . Iodine Anaphylaxis  . Naproxen Anaphylaxis and Hives  . Penicillins Anaphylaxis  . Sulfa Antibiotics Nausea And Vomiting  . Other     Bee stings and medication in the cain family  . Tape     Adhesive tapes   Medications Prior to Admission  Medication Sig Dispense Refill  . aspirin 81 MG tablet Take 324 mg by mouth daily.      . ciprofloxacin (CIPRO) 500 MG tablet Take 1 tablet (500 mg total) by mouth 2 (two) times daily.  20 tablet  2  . clindamycin (CLEOCIN) 150 MG capsule Take  150 mg by mouth 3 (three) times daily. 04-26-13 for 30 days.      . diazepam (VALIUM) 2 MG tablet Take 2 mg by mouth every 6 (six) hours as needed for anxiety.      . insulin glargine (LANTUS) 100 UNIT/ML injection Inject 24 Units into the skin at bedtime.       . TraMADol HCl 50 MG TBDP Take 50 mg by mouth every 6 (six) hours as needed (pain).        Home: Home Living Family/patient expects to be discharged to:: Inpatient rehab Living Arrangements: Alone (Patient with estranged husband - taking care of her animals ) Available Help at  Discharge: Family;Available PRN/intermittently Type of Home: House Home Access: Ramped entrance Home Layout: One level Home Equipment: Cane - single point;Grab bars - tub/shower  Functional History:   Functional Status:  Mobility: Bed Mobility Bed Mobility: Supine to Sit;Sitting - Scoot to [place] of Bed;Sit to Supine Supine to Sit: 3: Mod assist;With rails;HOB elevated Sitting - Scoot to Edge of Bed: 4: Min assist;With rail Sit to Supine: 4: Min assist;With rail;HOB elevated Transfers Transfers: Sit to Stand Sit to Stand: 2: Max assist;With upper extremity assist;From bed Ambulation/Gait Ambulation/Gait Assistance: Not tested (comment)    ADL:    Cognition: Cognition Overall Cognitive Status: Within Functional Limits for tasks assessed Orientation Level: Oriented X4 Cognition Arousal/Alertness: Awake/alert Behavior During Therapy: Anxious Overall Cognitive Status: Within Functional Limits for tasks assessed  Blood pressure 124/73, pulse 91, temperature 98.8 F (37.1 C), temperature source Oral, resp. rate 17, height 5\' 8"  (1.727 m), weight 90.8 kg (200 lb 2.8 oz), SpO2 93.00%. Physical Exam  Vitals reviewed. Constitutional: She is oriented to person, place, and time.  HENT:  Head: Normocephalic and atraumatic.  Right Ear: External ear normal.  Left Ear: External ear normal.  Eyes: EOM are normal.  Neck: Normal range of motion. Neck supple. No JVD present. No tracheal deviation present. No thyromegaly present.  Cardiovascular: Normal rate and regular rhythm.   Respiratory: Effort normal and breath sounds normal. No respiratory distress.  GI: Soft. Bowel sounds are normal. She exhibits no distension.  Musculoskeletal:  Right leg in post-op, coban dressing. Leg expectedly tender  Lymphadenopathy:    She has no cervical adenopathy.  Neurological: She is alert and oriented to person, place, and time. She displays normal reflexes. No cranial nerve deficit. Coordination  normal.  Unable to lift right leg agst gravity. Minimal sensory loss left foot.  Skin:  Postoperative dressing to BKA site  Psychiatric: She has a normal mood and affect. Her behavior is normal. Judgment and thought content normal.    Results for orders placed during the hospital encounter of 05/09/13 (from the past 24 hour(s))  GLUCOSE, CAPILLARY     Status: Abnormal   Collection Time    05/12/13  7:47 AM      Result Value Range   Glucose-Capillary 369 (*) 70 - 99 mg/dL  GLUCOSE, CAPILLARY     Status: Abnormal   Collection Time    05/12/13 12:07 PM      Result Value Range   Glucose-Capillary 300 (*) 70 - 99 mg/dL  GLUCOSE, CAPILLARY     Status: Abnormal   Collection Time    05/12/13  5:33 PM      Result Value Range   Glucose-Capillary 306 (*) 70 - 99 mg/dL  GLUCOSE, CAPILLARY     Status: Abnormal   Collection Time    05/12/13  9:10 PM  Result Value Range   Glucose-Capillary 292 (*) 70 - 99 mg/dL   Comment 1 Notify RN     No results found.  Assessment/Plan: Diagnosis: right BKA 1. Does the need for close, 24 hr/day medical supervision in concert with the patient's rehab needs make it unreasonable for this patient to be served in a less intensive setting? Yes 2. Co-Morbidities requiring supervision/potential complications: dm, htn, depression 3. Due to bladder management, bowel management, safety, skin/wound care, disease management, medication administration, pain management and patient education, does the patient require 24 hr/day rehab nursing? Yes 4. Does the patient require coordinated care of a physician, rehab nurse, PT (1-2 hrs/day, 5 days/week) and OT (1-2 hrs/day, 5 days/week) to address physical and functional deficits in the context of the above medical diagnosis(es)? Yes Addressing deficits in the following areas: balance, endurance, locomotion, strength, transferring, bowel/bladder control, bathing, dressing, feeding, grooming and toileting 5. Can the patient  actively participate in an intensive therapy program of at least 3 hrs of therapy per day at least 5 days per week? Yes 6. The potential for patient to make measurable gains while on inpatient rehab is excellent 7. Anticipated functional outcomes upon discharge from inpatient rehab are mod I with PT, mod I with OT, n/a with SLP. 8. Estimated rehab length of stay to reach the above functional goals is: 10-14 days 9. Does the patient have adequate social supports to accommodate these discharge functional goals? Yes 10. Anticipated D/C setting: Home 11. Anticipated post D/C treatments: HH therapy 12. Overall Rehab/Functional Prognosis: excellent  RECOMMENDATIONS: This patient's condition is appropriate for continued rehabilitative care in the following setting: CIR Patient has agreed to participate in recommended program. Yes Note that insurance prior authorization may be required for reimbursement for recommended care.  Comment: Pt is motivated. Needs to be independent during the day. May need to use walker for parts of home which won't accommodate w/c. Rehab RN to follow up.   Ranelle Oyster, MD, [place] [facility]     05/13/2013

## 2013-05-13 NOTE — Progress Notes (Signed)
Rehab admissions - I met with patient.  She would like inpatient rehab prior to home.  Bed available and can admit to inpatient rehab today.  Call me for questions.  #[phone]

## 2013-05-13 NOTE — Progress Notes (Signed)
Discussed with patient multiple allergies to medications, discussed for pain management ordered Norco and Ultram. Patient stated " can't take Norco because of hydrocodone- nausea, hives, swelling and the ultram has been making me sick on my stomach" Discussed with Deatra Ina, PA, on acute patient getting IV morphine and not ordered here, discussed adding antiemetic with pain medication to control nausea, Zofran ordered prn, patient aware of plan of care and verbalized understanding and agreement to try the combination with antiemetic. Will call when needed. Roberts[place]on[place], Brenly Trawick Elon Jester

## 2013-05-14 ENCOUNTER — Encounter (HOSPITAL_COMMUNITY): Payer: Self-pay | Admitting: Orthopedic Surgery

## 2013-05-14 ENCOUNTER — Inpatient Hospital Stay (HOSPITAL_COMMUNITY): Payer: Medicare Other

## 2013-05-14 ENCOUNTER — Inpatient Hospital Stay (HOSPITAL_COMMUNITY): Payer: Medicare Other | Admitting: Physical Therapy

## 2013-05-14 DIAGNOSIS — L98499 Non-pressure chronic ulcer of skin of other sites with unspecified severity: Secondary | ICD-10-CM

## 2013-05-14 DIAGNOSIS — D62 Acute posthemorrhagic anemia: Secondary | ICD-10-CM

## 2013-05-14 DIAGNOSIS — I739 Peripheral vascular disease, unspecified: Secondary | ICD-10-CM

## 2013-05-14 DIAGNOSIS — S88119A Complete traumatic amputation at level between knee and ankle, unspecified lower leg, initial encounter: Secondary | ICD-10-CM

## 2013-05-14 DIAGNOSIS — G547 Phantom limb syndrome without pain: Secondary | ICD-10-CM

## 2013-05-14 LAB — COMPREHENSIVE METABOLIC PANEL
ALT: 9 U/L (ref 0–35)
AST: 14 U/L (ref 0–37)
Albumin: 2.5 g/dL — ABNORMAL LOW (ref 3.5–5.2)
Alkaline Phosphatase: 63 U/L (ref 39–117)
Potassium: 4.1 mEq/L (ref 3.5–5.1)
Sodium: 140 mEq/L (ref 135–145)
Total Bilirubin: 0.2 mg/dL — ABNORMAL LOW (ref 0.3–1.2)
Total Protein: 6 g/dL (ref 6.0–8.3)

## 2013-05-14 LAB — CBC WITH DIFFERENTIAL/PLATELET
Basophils Absolute: 0 10*3/uL (ref 0.0–0.1)
Basophils Relative: 0 % (ref 0–1)
Eosinophils Absolute: 0.2 10*3/uL (ref 0.0–0.7)
MCH: 28.2 pg (ref 26.0–34.0)
MCHC: 32.8 g/dL (ref 30.0–36.0)
Neutrophils Relative %: 57 % (ref 43–77)
Platelets: 233 10*3/uL (ref 150–400)
RDW: 12.7 % (ref 11.5–15.5)

## 2013-05-14 LAB — GLUCOSE, CAPILLARY
Glucose-Capillary: 274 mg/dL — ABNORMAL HIGH (ref 70–99)
Glucose-Capillary: 101 mg/dL — ABNORMAL HIGH (ref 70–99)
Glucose-Capillary: 190 mg/dL — ABNORMAL HIGH (ref 70–99)

## 2013-05-14 MED ORDER — METHOCARBAMOL 100 MG/ML IJ SOLN
500.0000 mg | Freq: Four times a day (QID) | INTRAVENOUS | Status: DC | PRN
  Filled 2013-05-14 (×3): qty 5

## 2013-05-14 MED ORDER — HYDROCODONE-ACETAMINOPHEN 7.5-325 MG PO TABS
1.0000 | ORAL_TABLET | Freq: Two times a day (BID) | ORAL | Status: DC
  Administered 2013-05-14 – 2013-05-15 (×3): 1 via ORAL
  Filled 2013-05-14 (×3): qty 1

## 2013-05-14 MED ORDER — GLUCERNA SHAKE PO LIQD: 237.0000 mL | Freq: Every day | ORAL | Status: DC | PRN

## 2013-05-14 MED ORDER — METHOCARBAMOL 500 MG PO TABS
500.0000 mg | ORAL_TABLET | Freq: Four times a day (QID) | ORAL | Status: DC | PRN
  Administered 2013-05-14 – 2013-05-17 (×2): 500 mg via ORAL
  Filled 2013-05-14 (×2): qty 1

## 2013-05-14 NOTE — Evaluation (Signed)
Occupational Therapy Assessment and Plan  Patient Details  Name: [name] [name] MRN: [id number] Date of Birth: 13-Aug-1955  OT Diagnosis: acute pain and muscle weakness (generalized) Rehab Potential: Rehab Potential: Excellent ELOS: 7-10 days   Today's Date: 05/14/2013 Time: 0730-0830 Time Calculation (min): 60 min  Problem List:  Patient Active Problem List   Diagnosis Date Noted  . Hx of BKA 05/13/2013  . Diabetic foot infection 05/09/2013  . Sepsis 05/09/2013  . Accelerated hypertension 05/09/2013  . Depression 05/09/2013    Past Medical History:  Past Medical History  Diagnosis Date  . Dizziness   . Fatigue   . Swelling   . Depression   . Allergy     sinus problems  . IBS (irritable bowel syndrome)   . GERD (gastroesophageal reflux disease)   . Neuromuscular disorder   . Ulcer   . Complication of anesthesia     "[name]" (05/09/2013)  . PONV (postoperative nausea and vomiting)   . Poor circulation   . Varicose vein of leg   . Type II diabetes mellitus   . Migraines     "hits me here and there" (05/09/2013)  . Arthritis     "both hands"   . Charcot's joint of foot     "both feet"   . Fibromyalgia   . Chronic lower back pain     "2 cracked vertebra; 3 herniated discs" (05/09/2013)   Past Surgical History:  Past Surgical History  Procedure Laterality Date  . Cesarean section  1980; 1996  . Bunionectomy Right   . Cholecystectomy    . Tubal ligation  1996  . Retinal detachment surgery Left ~ 2012  . Varicose vein surgery Left ~ 2003    "it was full of blood clots" (05/09/2013)    Assessment & Plan Clinical Impression: Patient is a 57 y.o. year old right-handed female with history of uncontrolled diabetes mellitus as well as peripheral vascular disease. Presented 05/09/2013 with nonhealing right foot ulcer as well as fever. Patient had reportedly stepped on a piece of glass back in September of 2014 with poor wound healing. She had  been undergoing wound care by podiatry since September. MRI of the right foot showed marked soft tissue swelling and air in the plantar surface of the right foot consistent with infection. 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 Lajoyce Corners. Postoperative pain management.   Patient transferred to CIR on 05/13/2013 .    Patient currently requires max with basic self-care skills secondary to muscle weakness.  Prior to hospitalization, patient could complete BADL/iADL with modified independent .  Patient will benefit from skilled intervention to increase independence with basic self-care skills and increase level of independence with iADL prior to discharge home independently.  Anticipate patient will require intermittent supervision and follow up home health.  OT - End of Session Activity Tolerance: Tolerates 30+ min activity with multiple rests Endurance Deficit: Yes OT Assessment Rehab Potential: Excellent Barriers to Discharge: Decreased caregiver support OT Patient demonstrates impairments in the following area(s): Balance;Endurance;Pain;Safety OT Basic ADL's Functional Problem(s): Bathing;Dressing;Toileting OT Advanced ADL's Functional Problem(s): Simple Meal Preparation;Light Housekeeping OT Transfers Functional Problem(s): Toilet;Tub/Shower OT Additional Impairment(s): None OT Plan OT Intensity: Minimum of 1-2 x/day, 45 to 90 minutes OT Frequency: 5 out of 7 days OT Duration/Estimated Length of Stay: 7-10 days OT Treatment/Interventions: Discharge planning;Balance/vestibular training;Functional mobility training;Patient/family education;Pain management;Self Care/advanced ADL retraining;Therapeutic Activities;Therapeutic Exercise;UE/LE Strength taining/ROM;Wheelchair propulsion/positioning;DME/adaptive equipment  instruction;Disease mangement/prevention OT Self Feeding Anticipated Outcome(s): Independent OT Basic Self-Care Anticipated  Outcome(s): Mod I OT Toileting Anticipated Outcome(s): Mod I OT Bathroom Transfers Anticipated Outcome(s): Mod I OT Recommendation Equipment Recommended: 3 in 1 bedside comode;Tub/shower seat   Skilled Therapeutic Intervention 1:1 OT initial evaluation completed with treatment to emphasize improved dynamic standing balance, transfers, adapted bathing/dressing skills, pain management, safety awarenecss and effective use of DME.   Patient able to complete bathing and dressing seated at sink in her w/c but she required mod assist to stand/sit to don underwear and pants.    Patient demonstrated good attention to task but required redirection and verbal cues to progress through session.  OT Evaluation Precautions/Restrictions  Precautions Precautions: Fall Precaution Comments: No NWB orders for Rt. LE, per typical protocol pt NWB on Rt. LE - requested order from RN.  Restrictions Weight Bearing Restrictions: No  General Chart Reviewed: Yes Family/Caregiver Present: No  Vital Signs Therapy Vitals Temp: 98.5 F (36.9 C) Temp src: Oral Pulse Rate: 90 Resp: 18 BP: 128/72 mmHg Patient Position, if appropriate: Lying Oxygen Therapy SpO2: 97 %  Pain Pain Assessment Pain Assessment: 0-10 Pain Score: 6  Pain Type: Acute pain Pain Location: Leg Pain Orientation: Right;Distal Pain Descriptors / Indicators: Burning Pain Onset: On-going Pain Intervention(s): RN made aware;Repositioned Multiple Pain Sites: No  Home Living/Prior Functioning Home Living Available Help at Discharge: Family;Available PRN/intermittently (evenings (they had not been living together so he may not stick together)) Type of Home: House Home Access: Ramped entrance (may have to have it re-done) Home Layout: One level Additional Comments: maybe walker- may be able to go sideways  Lives With: Spouse IADL History Homemaking Responsibilities: Yes Meal Prep Responsibility: Primary Laundry Responsibility:  Primary Cleaning Responsibility: Primary Bill Paying/Finance Responsibility: Primary Shopping Responsibility: Primary Child Care Responsibility: No Homemaking Comments: 5 pets Current License: Yes Mode of Transportation: Car Education: HS + some college Occupation: On disability Type of Occupation: odd jobs, 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 Also used motorized cart at store to decrease ambulation on foot.  Makes jewlry.   ADL See FIM  Vision/Perception  Vision - History Baseline Vision: Wears glasses all the time Vision - Assessment Eye Alignment: Within Functional Limits Perception Perception: Within Functional Limits Praxis Praxis: Intact   Cognition Overall Cognitive Status: Within Functional Limits for tasks assessed Orientation Level: Oriented X4 Attention: Divided Divided Attention: Appears intact Memory: Appears intact Awareness: Appears intact Problem Solving: Appears intact Safety/Judgment: Appears intact  Sensation Sensation Light Touch: Appears Intact Stereognosis: Appears Intact Hot/Cold: Appears Intact Proprioception: Appears Intact Coordination Gross Motor Movements are Fluid and Coordinated: Yes Fine Motor Movements are Fluid and Coordinated: Yes  Motor  Motor Motor: Within Functional Limits  Mobility  Bed Mobility Bed Mobility: Rolling Left Rolling Left: 5: Supervision Rolling Left Details: Verbal cues for safe use of DME/AE;Verbal cues for precautions/safety Supine to Sit: 5: Supervision Supine to Sit Details: Verbal cues for safe use of DME/AE;Verbal cues for technique Sitting - Scoot to Edge of Bed: 5: Supervision Transfers Transfers: Sit to  Stand Sit to Stand: With armrests;With upper extremity assist;3: Mod assist Sit to Stand Details: Verbal cues for technique;Tactile cues for posture Stand to Sit: 4: Min guard   Trunk/Postural Assessment  Cervical Assessment Cervical Assessment:  Within Functional Limits Thoracic Assessment Thoracic Assessment: Within Functional Limits Lumbar Assessment Lumbar Assessment: Within Functional Limits Postural Control Postural Control: Within Functional Limits   Balance Static Sitting Balance Static Sitting - Balance Support: No upper extremity supported;Feet unsupported Static Sitting - Level of Assistance: 5: Stand by assistance Static Standing Balance Static Standing - Balance Support: Bilateral upper extremity supported Static Standing - Level of Assistance: 3: Mod assist (posterior lean)  Extremity/Trunk Assessment RUE Assessment RUE Assessment: Within Functional Limits LUE Assessment LUE Assessment: Within Functional Limits  FIM:  FIM - Eating Eating Activity: 6: Assistive device: dentures FIM - Grooming Grooming Steps: Wash, rinse, dry face;Wash, rinse, dry hands;Oral care, brush teeth, clean dentures;Brush, comb hair Grooming: 5: Supervision: safety issues or verbal cues FIM - Bathing Bathing Steps Patient Completed: Chest;Right Arm;Left Arm;Abdomen;Front perineal area;Buttocks;Right upper leg;Left upper leg;Left lower leg (including foot) Bathing: 4: Min-Patient completes 8-9 75f 10 parts or 75+ percent FIM - Upper Body Dressing/Undressing Upper body dressing/undressing steps patient completed: Thread/unthread right sleeve of pullover shirt/dresss;Thread/unthread left sleeve of pullover shirt/dress;Put head through opening of pull over shirt/dress;Pull shirt over trunk Upper body dressing/undressing: 5: Set-up assist to: Obtain clothing/put away FIM - Lower Body Dressing/Undressing Lower body dressing/undressing steps patient completed: Thread/unthread right underwear  leg;Thread/unthread left underwear leg;Thread/unthread right pants leg;Thread/unthread left pants leg Lower body dressing/undressing: 2: Max-Patient completed 25-49% of tasks FIM - Banker Devices: Bed rails;Arm rests Bed/Chair Transfer: 4: Supine > Sit: Min A (steadying Pt. > 75%/lift 1 leg);4: Sit > Supine: Min A (steadying pt. > 75%/lift 1 leg) FIM - Diplomatic Services operational officer Devices: Bedside commode Toilet Transfers: 4-To toilet/BSC: Min A (steadying Pt. > 75%);4-From toilet/BSC: Min A (steadying Pt. > 75%) FIM - Tub/Shower Transfers Tub/shower Transfers: 0-Activity did not occur or was simulated   Refer to Care Plan for Long Term Goals  Recommendations for other services: None  Discharge Criteria: Patient will be discharged from OT if patient refuses treatment 3 consecutive times without medical reason, if treatment goals not met, if there is a change in medical status, if patient makes no progress towards goals or if patient is discharged from hospital.  The above assessment, treatment plan, treatment alternatives and goals were discussed and mutually agreed upon: by patient  [facility] 05/14/2013, 12:17 PM

## 2013-05-14 NOTE — Progress Notes (Signed)
Patient information reviewed and entered into eRehab system by Mckinleigh Schuchart, RN, CRRN, PPS Coordinator.  Information including medical coding and functional independence measure will be reviewed and updated through discharge.     Per nursing patient was given "Data Collection Information Summary for Patients in Inpatient Rehabilitation Facilities with attached "Privacy Act Statement-Health Care Records" upon admission.  

## 2013-05-14 NOTE — Progress Notes (Signed)
Subjective/Complaints: Woke up and right leg is throbbing. Some phantom pain too. Last pain med before bedtime A 12 point review of systems has been performed and if not noted above is otherwise negative.   Objective: Vital Signs: Blood pressure 128/72, pulse 90, temperature 98.5 F (36.9 C), temperature source Oral, resp. rate 18, SpO2 97.00%. No results found.  Recent Labs  05/13/13 1805 05/14/13 0610  WBC 8.2 7.0  HGB 10.1* 9.7*  HCT 30.2* 29.6*  PLT 246 233    Recent Labs  05/13/13 1805 05/14/13 0610  NA  --  140  K  --  4.1  CL  --  103  GLUCOSE  --  194*  BUN  --  15  CREATININE 0.71 0.52  CALCIUM  --  8.2*   CBG (last 3)   Recent Labs  05/13/13 1132 05/13/13 1647 05/13/13 2043  GLUCAP 305* 290* 225*    Wt Readings from Last 3 Encounters:  05/09/13 90.8 kg (200 lb 2.8 oz)  05/09/13 90.8 kg (200 lb 2.8 oz)  03/14/13 95.709 kg (211 lb)    Physical Exam:  Constitutional: She is oriented to person, place, and time.  HENT:  Head: Normocephalic and atraumatic.  Right Ear: External ear normal.  Left Ear: External ear normal.  Eyes: EOM are normal.  Neck: Normal range of motion. Neck supple. No JVD present. No tracheal deviation present. No thyromegaly present.  Cardiovascular: Normal rate and regular rhythm. No murmurs or rubs  Respiratory: Effort normal and breath sounds normal. No respiratory distress.  GI: Soft. Bowel sounds are normal. She exhibits no distension.  Musculoskeletal:  Right leg in post-op, coban dressing. Leg very tender  Lymphadenopathy:  She has no cervical adenopathy.  Neurological: She is alert and oriented to person, place, and time. She displays normal reflexes. No cranial nerve deficit. Coordination normal.  Has difficulty moving right leg due to pain.  Skin:  Postoperative dressing to BKA site. No visible drainage. Remaining skin intact Psychiatric: She has a normal mood and affect. Her behavior is normal. Judgment and  thought content normal    Assessment/Plan: 1. Functional deficits secondary to right BKA which require 3+ hours per day of interdisciplinary therapy in a comprehensive inpatient rehab setting. Physiatrist is providing close team supervision and 24 hour management of active medical problems listed below. Physiatrist and rehab team continue to assess barriers to discharge/monitor patient progress toward functional and medical goals. FIM:                                  Medical Problem List and Plan:  1.Right BKA  2. DVT Prophylaxis/Anticoagulation: Subcutaneous heparin. Monitor platelet counts and any signs of bleeding  3. Pain Management:tramadol 50 mg every 6 as needed moderate pain.  Hydrocodone1 tab every 6 as needed as needed. Will schedule hydrocodone in am and at bed time each day. 4. Mood: Valium 2 mg every 6 as needed anxiety. Provide emotional support  5. Neuropsych: This patient is capable of making decisions on her own behalf.  6.Diabetes mellitus with peripheral neuropathy.Hemoglobin A1c 10.8.NovoLog 5 units 3 times a day, Lantus insulin 40 units twice a day. Check blood sugars a.c. And at bedtime. Poor control thus far but sugar lower this am---follow for pattern 7.Hypertension.: Coreg 3.125 mg twice a day,lisinopril 10 mg daily. Monitor with increased mobility  8.Tobacco abuse. Counseling   LOS (Days) 1 A FACE TO FACE EVALUATION  WAS PERFORMED  SWARTZ,ZACHARY T 05/14/2013 7:29 AM

## 2013-05-14 NOTE — Progress Notes (Signed)
Physical Therapy Session Note  Patient Details  Name: [name] [name] MRN: [id number] Date of Birth: 22-Nov-1955  Today's Date: 05/14/2013 Time: 4098-1191 Time Calculation (min): 30 min  Skilled Therapeutic Interventions/Progress Updates:   Reports phantom pain/sensation and education provided on desensitization techniques and positioning. Issued HEP for residual limb strengthening including hip abduction and extension in sidelying, knee flexion/extension (in supine and seated EOB), hip flexion, and glut & quad sets x 10 reps each.   Therapy Documentation Precautions:  Precautions Precautions: Fall Precaution Comments: No NWB orders for Rt. LE, per typical protocol pt NWB on Rt. LE - requested order from RN.  Restrictions Weight Bearing Restrictions: Yes RLE Weight Bearing: Non weight bearing  See FIM for current functional status  Therapy/Group: Individual Therapy  Karolee Stamps [facility] 05/14/2013, 12:23 PM

## 2013-05-14 NOTE — Progress Notes (Signed)
Inpatient Diabetes Program Recommendations  AACE/ADA: New Consensus Statement on Inpatient Glycemic Control (2013)  Target Ranges:  Prepandial:   less than 140 mg/dL      Peak postprandial:   less than 180 mg/dL (1-2 hours)      Critically ill patients:  140 - 180 mg/dL   Reason for Visit: Hyperglycemia Results for SINDEE, STUCKER (MRN [id number]) as of 05/14/2013 12:22  Ref. Range 05/13/2013 11:32 05/13/2013 16:47 05/13/2013 20:43 05/14/2013 07:27 05/14/2013 11:16  Glucose-Capillary Latest Range: 70-99 mg/dL 409 (H) 811 (H) 914 (H) 185 (H) 274 (H)     Inpatient Diabetes Program Recommendations Insulin - Basal: Increase Lantus to 42 units bid (Titrate until FBS < 180 mg/dL Correction (SSI): Increase Novolog to resistant tidwc and HS  Note: Will follow. Thank you. Ailene Ards, RD, LDN, CDE Inpatient Diabetes Coordinator [phone]

## 2013-05-14 NOTE — Progress Notes (Signed)
Physical Therapy Session Note  Patient Details  Name: [name] [name] MRN: [id number] Date of Birth: [birth year]  Today's Date: 05/14/2013 Time: 1300-1400 Time Calculation (min): 60 min  Skilled Therapeutic Interventions/Progress Updates:  Still a little lightheaded BP 107/61, not overly lightheaded rest of session. Wheelchair propulsion 2 x 150' with supervision, assist needed for parts management. Forward/back hops in parallel bars with min/mod assist, able to do 3-4 hops in a row but then required seated rest due to fatigue. Pt performed 5 bouts. Standing dynamic reaching task with one UE support min assist x 2 bouts of ~2 min each. Practiced pressure relief in wheelchair (wheelchair pushups x 10, lateral leans) sign placed in room. Pt given timer as she can not read clock. Adjusted leg rest for proper positioning of Rt. Knee.   Therapy Documentation Precautions:  Precautions Precautions: Fall Precaution Comments: No NWB orders for Rt. LE, per typical protocol pt NWB on Rt. LE - requested order from RN.  Restrictions Weight Bearing Restrictions: Yes RLE Weight Bearing: Non weight bearing Pain: No overt pain at start of treatment  See FIM for current functional status  Therapy/Group: Individual Therapy  Wilhemina Bonito 05/14/2013, 2:44 PM

## 2013-05-14 NOTE — Progress Notes (Addendum)
INITIAL NUTRITION ASSESSMENT  DOCUMENTATION CODES Per approved criteria  -Obesity Unspecified   INTERVENTION: Add Glucerna Shake po prn, each supplement provides 220 kcal and 10 grams of protein. RD to provide diet education closer to d/c.  NUTRITION DIAGNOSIS: Increased nutrient needs r/t post-op healing AEB estimated needs.  Goal: Pt to meet >/= 90% of their estimated nutrition needs  Monitor:  Wt, po intake, labs, acceptance of supplements  Reason for Assessment: Malnutrition Screening Tool  57 y.o. female  Admitting Dx: s/p R BKA  ASSESSMENT: Pt with history of uncontrolled diabetes, depression that presents to the emergency department for foot ulcer. Patient states her she had previously he stepped on a piece of glass back in September 2014. She has been seeing wound care. 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  Pt seen by RD staff during acute hospitalization. Pt is now eating better than she was immediately post-op. She enjoys the foods. Received Glucerna Shakes during acute admission but didn't "love them." Agreeable to them if she needs them. States that she eats pretty healthy at baseline and would like education closer to d/c to help give her meal ideas.  Blood sugars ranging from 189-225.  Height: Ht Readings from Last 1 Encounters:  05/09/13 5\' 8"  (1.727 m)    Weight: Wt Readings from Last 1 Encounters:  05/09/13 200 lb 2.8 oz (90.8 kg)    Ideal Body Weight: 131 lb (adjusted for BKA)  % Ideal Body Weight: 153%  Wt Readings from Last 10 Encounters:  05/09/13 200 lb 2.8 oz (90.8 kg)  05/09/13 200 lb 2.8 oz (90.8 kg)  03/14/13 211 lb (95.709 kg)    Usual Body Weight: 215-220 lbs  % Usual Body Weight: 92%  BMI:  32.5 (using adjusted weight for BKA) - Obese Class I  Estimated Nutritional Needs: Kcal: 2250-2500 Protein: 110-125 g/day Fluid: 2.3-2.5 L/day  Skin: diabetic ulcer on right foot  Diet Order: Carb Control Medium (1600 - 2000)  EDUCATION  NEEDS: -No education needs identified at this time   Intake/Output Summary (Last 24 hours) at 05/14/13 1035 Last data filed at 05/14/13 0745  Gross per 24 hour  Intake    720 ml  Output      0 ml  Net    720 ml    Last BM: 12/8  Labs:   Recent Labs Lab 05/10/13 0526 05/11/13 0400 05/13/13 1805 05/14/13 0610  NA 136 138  --  140  K 3.4* 4.1  --  4.1  CL 101 101  --  103  CO2 26 27  --  28  BUN 9 7  --  15  CREATININE 0.49* 0.42* 0.71 0.52  CALCIUM 8.1* 8.6  --  8.2*  GLUCOSE 236* 252*  --  194*    CBG (last 3)   Recent Labs  05/13/13 1647 05/13/13 2043 05/14/13 0727  GLUCAP 290* 225* 185*    Scheduled Meds: . aspirin  324 mg Oral Daily  . carvedilol  3.125 mg Oral BID WC  . heparin  5,000 Units Subcutaneous Q8H  . HYDROcodone-acetaminophen  1 tablet Oral BID  . insulin aspart  0-15 Units Subcutaneous TID WC  . insulin aspart  5 Units Subcutaneous TID WC  . insulin glargine  40 Units Subcutaneous BID  . lisinopril  10 mg Oral Daily    Continuous Infusions:   Past Medical History  Diagnosis Date  . Dizziness   . Fatigue   . Swelling   . Depression   .  Allergy     sinus problems  . IBS (irritable bowel syndrome)   . GERD (gastroesophageal reflux disease)   . Neuromuscular disorder   . Ulcer   . Complication of anesthesia     "[name]" (05/09/2013)  . PONV (postoperative nausea and vomiting)   . Poor circulation   . Varicose vein of leg   . Type II diabetes mellitus   . Migraines     "hits me here and there" (05/09/2013)  . Arthritis     "both hands"   . Charcot's joint of foot     "both feet"   . Fibromyalgia   . Chronic lower back pain     "2 cracked vertebra; 3 herniated discs" (05/09/2013)    Past Surgical History  Procedure Laterality Date  . Cesarean section  1980; 1996  . Bunionectomy Right   . Cholecystectomy    . Tubal ligation  1996  . Retinal detachment surgery Left ~ 2012  . Varicose vein surgery  Left ~ 2003    "it was full of blood clots" (05/09/2013)    Jarold Motto MS, RD, LDN Pager: [phone] After-hours pager: [phone]

## 2013-05-14 NOTE — Evaluation (Signed)
Physical Therapy Assessment and Plan  Patient Details  Name: [name] [name] MRN: [id number] Date of Birth: 01/09/1956  PT Diagnosis: Abnormal posture, Abnormality of gait, Contracture of joint: High risk for contracture of Rt. knee, Difficulty walking, Impaired sensation, Muscle weakness and Pain in Rt. residual limb/phantom  Rehab Potential: Good ELOS: 10-14 days   Today's Date: 05/14/2013 Time: 4098-1191 Time Calculation (min): 60 min  Problem List:  Patient Active Problem List   Diagnosis Date Noted  . Hx of BKA 05/13/2013  . Diabetic foot infection 05/09/2013  . Sepsis 05/09/2013  . Accelerated hypertension 05/09/2013  . Depression 05/09/2013    Past Medical History:  Past Medical History  Diagnosis Date  . Dizziness   . Fatigue   . Swelling   . Depression   . Allergy     sinus problems  . IBS (irritable bowel syndrome)   . GERD (gastroesophageal reflux disease)   . Neuromuscular disorder   . Ulcer   . Complication of anesthesia     "[name]" (05/09/2013)  . PONV (postoperative nausea and vomiting)   . Poor circulation   . Varicose vein of leg   . Type II diabetes mellitus   . Migraines     "hits me here and there" (05/09/2013)  . Arthritis     "both hands"   . Charcot's joint of foot     "both feet"   . Fibromyalgia   . Chronic lower back pain     "2 cracked vertebra; 3 herniated discs" (05/09/2013)   Past Surgical History:  Past Surgical History  Procedure Laterality Date  . Cesarean section  1980; 1996  . Bunionectomy Right   . Cholecystectomy    . Tubal ligation  1996  . Retinal detachment surgery Left ~ 2012  . Varicose vein surgery Left ~ 2003    "it was full of blood clots" (05/09/2013)    Assessment & Plan Clinical Impression: [name] [name] is a 57 y.o. right-handed female with history of uncontrolled diabetes mellitus as well as peripheral vascular disease. Presented 05/09/2013 with nonhealing right foot ulcer as  well as fever. Patient had reportedly stepped on a piece of glass back in September of 2014 with poor wound healing. She had been undergoing wound care by podiatry since September. MRI of the right foot showed marked soft tissue swelling and air in the plantar surface of the right foot consistent with infection. 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 Lajoyce Corners. Postoperative pain management.   Patient transferred to CIR on 05/13/2013 .   Patient currently requires mod with transfers, +2 with gait secondary to muscle weakness, decreased cardiorespiratoy endurance, impaired timing and sequencing and decreased standing balance and decreased balance strategies.  Pt will benefit from focus on strict adherence to positioning and exercise to decrease risk for Rt. Knee contracture, standing balance and tolerance, safety with transfers, pain management, and preparation for home environment at wheelchair level as husband may or may not be present (not on good terms and he works). Pt reports ramp is in poor shape, have already recommended pt start contacting family and friends to get ramp fixed.  Prior to hospitalization, patient was modified independent  with mobility and lived with Spouse in a House home.  Home access is  Ramped entrance (may have to have it re-done).  Patient will benefit from skilled PT intervention to maximize safe functional mobility for planned discharge home with  intermittent assist.  Anticipate patient will benefit from follow up HH at discharge.  PT - End of Session Endurance Deficit: Yes PT Assessment Rehab Potential: Good Barriers to Discharge: Decreased caregiver support PT Patient demonstrates impairments in the following area(s): Balance;Endurance;Pain;Safety;Sensory;Skin Integrity PT Transfers Functional Problem(s): Bed Mobility;Bed to Chair;Car;Furniture;Floor PT Locomotion Functional Problem(s): Ambulation;Wheelchair Mobility PT  Plan PT Intensity: Minimum of 1-2 x/day ,45 to 90 minutes PT Frequency: 5 out of 7 days PT Duration Estimated Length of Stay: 10-14 days PT Treatment/Interventions: Ambulation/gait training;Balance/vestibular training;Disease management/prevention;Discharge planning;Community reintegration;DME/adaptive equipment instruction;Functional mobility training;Patient/family education;Splinting/orthotics;Therapeutic Exercise;UE/LE Coordination activities;Therapeutic Activities;Skin care/wound management;Pain management;Neuromuscular re-education;Psychosocial support;UE/LE Strength taining/ROM;Wheelchair propulsion/positioning PT Transfers Anticipated Outcome(s): Modified independent PT Locomotion Anticipated Outcome(s): Modified independent weelchair level, min assist short distance gait PT Recommendation Follow Up Recommendations: Home health PT;Other (comment) (intermittent supervision) Patient destination: Home Equipment Recommended: Rolling walker with 5" wheels;Wheelchair cushion (measurements);Wheelchair (measurements)  Skilled Therapeutic Intervention Pt educated extensively on importance of positioning into extension in wheelchair and bed. Also performed 2 x 10 reps of quad sets while seated in wheelchair. Pt educated slightly on pain management, will further address during stay. Attempted ambulation (only ~3 hops) pt limited by feeling lightheaded and BP 94/58. This did not resolve with seated rest and in fact was worsening, returned to room and RN made aware. Pt returned to bed. Pt able to verbalize positioning of Rt. LE. Attempted to set up amputee pad on wheelchair but did not have correct fit, will need to fix next session.  PT Evaluation Precautions/Restrictions Precautions Precautions: Fall Precaution Comments: No NWB orders for Rt. LE, per typical protocol pt NWB on Rt. LE - requested order from RN.  Restrictions Weight Bearing Restrictions: Yes RLE Weight Bearing: Non weight  bearing Pain Pain Assessment Pain Assessment: 0-10 Pain Score: 4  (headache) Pain Type: Acute pain Pain Location: Leg Pain Orientation: Right;Distal Pain Descriptors / Indicators: Burning Pain Onset: On-going Pain Intervention(s): RN made aware;Repositioned Multiple Pain Sites: No Home Living/Prior Functioning Home Living Available Help at Discharge: Family;Available PRN/intermittently (evenings (they had not been living together so he may not stick together)) Type of Home: House Home Access: Ramped entrance (may have to have it re-done) Home Layout: One level Additional Comments: maybe walker- may be able to go sideways  Lives With: Spouse Prior Function Level of Independence: Independent with basic ADLs;Requires assistive device for independence  Able to Take Stairs?: Yes Driving: Yes Vocation: On disability Leisure: Hobbies-yes (Comment) Comments: Was using cane for community ambulation, mostly for WB status. Also used motorized cart at store to decrease ambulation on foot.  Makes jewlry.  Vision/Perception  Vision - History Baseline Vision: Wears glasses all the time Vision - Assessment Eye Alignment: Within Functional Limits Perception Perception: Within Functional Limits Praxis Praxis: Intact  Cognition Overall Cognitive Status: Within Functional Limits for tasks assessed Orientation Level: Oriented X4 Attention: Divided Divided Attention: Appears intact Memory: Appears intact Awareness: Appears intact Problem Solving: Appears intact Safety/Judgment: Appears intact Comments: Talkative Sensation Sensation Light Touch: Impaired Detail Light Touch Impaired Details: Impaired LLE (particularly toes and bottom of foot) Stereognosis: Appears Intact Hot/Cold: Appears Intact Proprioception: Impaired by gross assessment Coordination Gross Motor Movements are Fluid and Coordinated: Yes Fine Motor Movements are Fluid and Coordinated: Yes Motor  Motor Motor: Within  Functional Limits  Mobility Bed Mobility Bed Mobility: Rolling Right;Right Sidelying to Sit;Sit to Supine Rolling Right: 5: Supervision Rolling Left: 5: Supervision Rolling Left Details: Verbal cues for precautions/safety Rolling Left Details (indicate cue type and reason):  Performed x 2, discussed use of log roll to protect back and for ease of transfer Right Sidelying to Sit: 5: Supervision Supine to Sit: 5: Supervision Supine to Sit Details: Verbal cues for technique Supine to Sit Details (indicate cue type and reason): Cues for efficiency Sitting - Scoot to Edge of Bed: 5: Supervision Sit to Supine: 4: Min assist Transfers Transfers: Yes Sit to Stand: With armrests;With upper extremity assist;3: Mod assist;From bed;From chair/3-in-1 Sit to Stand Details: Verbal cues for technique;Tactile cues for posture Sit to Stand Details (indicate cue type and reason): Mod assist, assist needed particularly with wheelchair > RW hand transition Stand to Sit: 3: Mod assist;To chair/3-in-1;To bed Stand Pivot Transfers: 3: Mod assist Stand Pivot Transfer Details (indicate cue type and reason): Verbal cues for set-up for transfer and sequencing.  Locomotion  Ambulation Ambulation: Yes Ambulation/Gait Assistance: 1: +2 Total assist Ambulation Distance (Feet): 2 Feet Assistive device: Rolling walker Ambulation/Gait Assistance Details: Demonstration prior to performance. Cues for sequencing. +2 for safety.  Gait Gait: Yes Gait Pattern: Impaired Gait Pattern:  (hop gait) Stairs / Additional Locomotion Stairs: No (unsafe at this time) Corporate treasurer: Yes Wheelchair Assistance: 5: Investment banker, operational: Both upper extremities;Left lower extremity Wheelchair Parts Management: Needs assistance  Trunk/Postural Assessment  Cervical Assessment Cervical Assessment: Within Functional Limits Thoracic Assessment Thoracic Assessment: Within Functional Limits Lumbar  Assessment Lumbar Assessment: Exceptions to [facility] Lumbar Strength Overall Lumbar Strength Comments: H/o lumbar stiffness, posterior pelvic tilt in standing Postural Control Postural Control: Within Functional Limits  Balance Static Sitting Balance Static Sitting - Balance Support: No upper extremity supported;Feet unsupported Static Sitting - Level of Assistance: 5: Stand by assistance Static Standing Balance Static Standing - Balance Support: Bilateral upper extremity supported Static Standing - Level of Assistance: 3: Mod assist (posterior lean) Extremity Assessment  RUE Assessment RUE Assessment: Within Functional Limits LUE Assessment LUE Assessment: Within Functional Limits RLE Assessment RLE Assessment: Exceptions to [facility] RLE AROM (degrees) RLE Overall AROM Comments: Pt has full range of hip and knee extension, lacking some knee flexion due to bandage.  RLE Strength RLE Overall Strength Comments: Only able to lift Rt. LE against gravity, unable to test knee flexion/extension due to pain.  LLE Assessment LLE Assessment: Exceptions to WFL LLE AROM (degrees) LLE Overall AROM Comments: WFL LLE Strength LLE Overall Strength Comments: generalized deconditioning grossly >3-/5  FIM:  FIM - Bed/Chair Transfer Bed/Chair Transfer Assistive Devices: Bed rails;Arm rests Bed/Chair Transfer: 4: Supine > Sit: Min A (steadying Pt. > 75%/lift 1 leg);4: Sit > Supine: Min A (steadying pt. > 75%/lift 1 leg) FIM - Locomotion: Ambulation Ambulation/Gait Assistance: 1: +2 Total assist   Refer to Care Plan for Long Term Goals  Recommendations for other services: Other: amputee support group & amputee peer mentor  Discharge Criteria: Patient will be discharged from PT if patient refuses treatment 3 consecutive times without medical reason, if treatment goals not met, if there is a change in medical status, if patient makes no progress towards goals or if patient is discharged from hospital.  The  above assessment, treatment plan, treatment alternatives and goals were discussed and mutually agreed upon: by patient  Wilhemina Bonito 05/14/2013, 10:01 AM

## 2013-05-15 ENCOUNTER — Inpatient Hospital Stay (HOSPITAL_COMMUNITY): Payer: Medicare Other | Admitting: Physical Therapy

## 2013-05-15 ENCOUNTER — Inpatient Hospital Stay (HOSPITAL_COMMUNITY): Payer: Medicare Other

## 2013-05-15 ENCOUNTER — Inpatient Hospital Stay (HOSPITAL_COMMUNITY): Payer: Medicare Other | Admitting: Occupational Therapy

## 2013-05-15 DIAGNOSIS — D62 Acute posthemorrhagic anemia: Secondary | ICD-10-CM

## 2013-05-15 DIAGNOSIS — L98499 Non-pressure chronic ulcer of skin of other sites with unspecified severity: Secondary | ICD-10-CM

## 2013-05-15 DIAGNOSIS — I739 Peripheral vascular disease, unspecified: Secondary | ICD-10-CM

## 2013-05-15 DIAGNOSIS — R6889 Other general symptoms and signs: Secondary | ICD-10-CM

## 2013-05-15 DIAGNOSIS — G547 Phantom limb syndrome without pain: Secondary | ICD-10-CM

## 2013-05-15 LAB — GLUCOSE, CAPILLARY
Glucose-Capillary: 167 mg/dL — ABNORMAL HIGH (ref 70–99)
Glucose-Capillary: 137 mg/dL — ABNORMAL HIGH (ref 70–99)
Glucose-Capillary: 109 mg/dL — ABNORMAL HIGH (ref 70–99)

## 2013-05-15 NOTE — Progress Notes (Signed)
Subjective/Complaints: bp low yesterday after getting up. Felt sick in the afternoon. Feeling better today. A 12 point review of systems has been performed and if not noted above is otherwise negative.   Objective: Vital Signs: Blood pressure 128/80, pulse 84, temperature 97.3 F (36.3 C), temperature source Oral, resp. rate 18, SpO2 99.00%. No results found.  Recent Labs  05/13/13 1805 05/14/13 0610  WBC 8.2 7.0  HGB 10.1* 9.7*  HCT 30.2* 29.6*  PLT 246 233    Recent Labs  05/13/13 1805 05/14/13 0610  NA  --  140  K  --  4.1  CL  --  103  GLUCOSE  --  194*  BUN  --  15  CREATININE 0.71 0.52  CALCIUM  --  8.2*   CBG (last 3)   Recent Labs  05/14/13 1635 05/14/13 2050 05/15/13 0726  GLUCAP 101* 190* 167*    Wt Readings from Last 3 Encounters:  05/09/13 90.8 kg (200 lb 2.8 oz)  05/09/13 90.8 kg (200 lb 2.8 oz)  03/14/13 95.709 kg (211 lb)    Physical Exam:  Constitutional: She is oriented to person, place, and time.  HENT:  Head: Normocephalic and atraumatic.  Right Ear: External ear normal.  Left Ear: External ear normal.  Eyes: EOM are normal.  Neck: Normal range of motion. Neck supple. No JVD present. No tracheal deviation present. No thyromegaly present.  Cardiovascular: Normal rate and regular rhythm. No murmurs or rubs  Respiratory: Effort normal and breath sounds normal. No respiratory distress.  GI: Soft. Bowel sounds are normal. She exhibits no distension.  Musculoskeletal:  Right leg in post-op, coban dressing. Leg very tender  Lymphadenopathy:  She has no cervical adenopathy.  Neurological: She is alert and oriented to person, place, and time. She displays normal reflexes. No cranial nerve deficit. Coordination normal.  Has difficulty moving right leg due to pain.  Skin:  Postoperative dressing to BKA site. No visible drainage. Remaining skin intact Psychiatric: She has a normal mood and affect. Her behavior is normal. Judgment and  thought content normal    Assessment/Plan: 1. Functional deficits secondary to right BKA which require 3+ hours per day of interdisciplinary therapy in a comprehensive inpatient rehab setting. Physiatrist is providing close team supervision and 24 hour management of active medical problems listed below. Physiatrist and rehab team continue to assess barriers to discharge/monitor patient progress toward functional and medical goals. FIM: FIM - Bathing Bathing Steps Patient Completed: Chest;Right Arm;Left Arm;Abdomen;Front perineal area;Buttocks;Right upper leg;Left upper leg;Left lower leg (including foot) Bathing: 4: Min-Patient completes 8-9 70f 10 parts or 75+ percent  FIM - Upper Body Dressing/Undressing Upper body dressing/undressing steps patient completed: Thread/unthread right sleeve of pullover shirt/dresss;Thread/unthread left sleeve of pullover shirt/dress;Put head through opening of pull over shirt/dress;Pull shirt over trunk Upper body dressing/undressing: 5: Set-up assist to: Obtain clothing/put away FIM - Lower Body Dressing/Undressing Lower body dressing/undressing steps patient completed: Thread/unthread right underwear leg;Thread/unthread left underwear leg;Thread/unthread right pants leg;Thread/unthread left pants leg Lower body dressing/undressing: 2: Max-Patient completed 25-49% of tasks  FIM - Toileting Toileting steps completed by patient: Adjust clothing prior to toileting;Performs perineal hygiene;Adjust clothing after toileting Toileting Assistive Devices: Grab bar or rail for support Toileting: 4: Steadying assist  FIM - Diplomatic Services operational officer Devices: Psychiatrist Transfers: 6-Assistive device: No helper  FIM - Banker Devices: Bed rails;Arm rests Bed/Chair Transfer: 4: Supine > Sit: Min A (steadying Pt. > 75%/lift 1 leg);4: Sit >  Supine: Min A (steadying pt. > 75%/lift 1 leg);3: Bed > Chair  or W/C: Mod A (lift or lower assist);3: Chair or W/C > Bed: Mod A (lift or lower assist)  FIM - Locomotion: Wheelchair Locomotion: Wheelchair: 2: Travels 50 - 149 ft with minimal assistance (Pt.>75%) FIM - Locomotion: Ambulation Ambulation/Gait Assistance: 1: +2 Total assist Locomotion: Ambulation: 1: Two helpers  Comprehension Comprehension Mode: Auditory Comprehension: 7-Follows complex conversation/direction: With no assist  Expression Expression Mode: Verbal Expression: 7-Expresses complex ideas: With no assist  Social Interaction Social Interaction: 7-Interacts appropriately with others - No medications needed.  Problem Solving Problem Solving: 7-Solves complex problems: Recognizes & self-corrects  Memory Memory: 6-Assistive device: No helper  Medical Problem List and Plan:  1.Right BKA  2. DVT Prophylaxis/Anticoagulation: Subcutaneous heparin. Monitor platelet counts and any signs of bleeding  3. Pain Management:tramadol 50 mg every 6 as needed moderate pain.  Hydrocodone1 tab every 6 as needed as needed. Will schedule hydrocodone in am and at bed time each day. 4. Mood: Valium 2 mg every 6 as needed anxiety. Provide emotional support  5. Neuropsych: This patient is capable of making decisions on her own behalf.  6.Diabetes mellitus with peripheral neuropathy.Hemoglobin A1c 10.8.NovoLog 5 units 3 times a day, Lantus insulin 40 units twice a day. Check blood sugars a.c. And at bedtime.  -sugars labile but seem to be normalizing. 7.Hypertension.: Coreg 3.125 mg twice a day,lisinopril 10 mg daily.--hold lisinopril given hypotension yesterday 8.Tobacco abuse. Counseling   LOS (Days) 2 A FACE TO FACE EVALUATION WAS PERFORMED  Creed Kail T 05/15/2013 7:49 AM

## 2013-05-15 NOTE — Patient Care Conference (Signed)
Inpatient RehabilitationTeam Conference and Plan of Care Update Date: 05/14/2013   Time: 2:25 PM    Patient Name: [name] [name]      Medical Record Number: [id number]  Date of Birth: [birth year] Sex: Female         Room/Bed: 4M07C/4M07C-01 Payor Info: Payor: MEDICARE / Plan: MEDICARE PART A AND B / Product Type: *No Product type* /    Admitting Diagnosis: R BKA  Admit Date/Time:  05/13/2013  3:39 PM Admission Comments: No comment available   Primary Diagnosis:  <principal problem not specified> Principal Problem: <principal problem not specified>  Patient Active Problem List   Diagnosis Date Noted  . Hx of BKA 05/13/2013  . Diabetic foot infection 05/09/2013  . Sepsis 05/09/2013  . Accelerated hypertension 05/09/2013  . Depression 05/09/2013    Expected Discharge Date: Expected Discharge Date: 05/24/13  Team Members Present: Physician leading conference: Dr. Faith Rogue Social Worker Present: Amada Jupiter, LCSW Nurse Present: Keturah Barre, RN PT Present: Sherrine Maples, Grayland Ormond, PT OT Present: Donzetta Kohut, OT;Kris Gellert, OT;Patricia [place], OT PPS Coordinator present : Tora Duck, RN, CRRN;Becky Henrene Dodge, PT     Current Status/Progress Goal Weekly Team Focus  Medical   pain issues better., psychologically more stable. still has Comptroller. think we can dc  increase activity, stabilize psychologically  see above. remove dressing   Bowel/Bladder   continent of bowel and bladder  Remain continent of bowel and bladder  monitor for constipation; per patient only has BM about 1-2 per week   Swallow/Nutrition/ Hydration             ADL's   Min A with ADL and transfers, progressing with functional mobility using LRAD  Mod I with ADL and transfers  Dynamic standing balance, endurance, safety awareness, functional transfers using RW   Mobility   min/supervision transfers, min gait  mod I wheelchair level/min assist gait  safety at wheelchair level, transfers, pain  modulation, HEP, standing balance and ambulation   Communication             Safety/Cognition/ Behavioral Observations  pt under suicide precautions with sitter inplace         Pain   phantom pain to R stump with sharp pain at time; pain level 3 out to 10. doesnt complain much of pain throughout day. Tylenol 650mg  q4 hrs prn  Pain level 3 or less on a scale of 0-10  monitor pain and any onset of pain. medicate as needed   Skin   right BKA surgical dressing inplace; bruising to BUE and abdomen  No new skin breakdown and infection.  monitor and assess skin q shift    Rehab Goals Patient on target to meet rehab goals: Yes *See Care Plan and progress notes for long and short-term goals.  Barriers to Discharge: entry into house    Possible Resolutions to Barriers:  ramp    Discharge Planning/Teaching Needs:  home with only intermittent assistance from estranged husband (he is in the home primarily to take care of animals and upkeep of house)      Team Discussion:  New eval.  Anticipated mod i goals overall.  BP issues - MD aware.  Some concerns about overall accessibility of home - to be discussed further with pt and husband.   Revisions to Treatment Plan:  None   Continued Need for Acute Rehabilitation Level of Care: The patient requires daily medical management by a physician with specialized training in physical medicine and rehabilitation for  the following conditions: Daily direction of a multidisciplinary physical rehabilitation program to ensure safe treatment while eliciting the highest outcome that is of practical value to the patient.: Yes Daily medical management of patient stability for increased activity during participation in an intensive rehabilitation regime.: Yes Daily analysis of laboratory values and/or radiology reports with any subsequent need for medication adjustment of medical intervention for : Post surgical problems;Other (psych)  Abrahan Fulmore 05/15/2013, 1:26  AM

## 2013-05-15 NOTE — Progress Notes (Signed)
Pt with emesis x 1, undigested eggs and oatmeal. Patient given Zofran 4 mg po, denies being nauseated now. Roberts-VonCannon, Kashae Carstens Elon Jester

## 2013-05-15 NOTE — Progress Notes (Signed)
Occupational Therapy Session Note  Patient Details  Name: [name] [name] MRN: [id number] Date of Birth: [birth year]  Today's Date: 05/15/2013 Time: 0830-0930 Time Calculation (min): 60 min  Short Term Goals: Week 1:  OT Short Term Goal 1 (Week 1): Patient will complete tub transfer to standard tub with tub bench using LRAD with Min A OT Short Term Goal 2 (Week 1): Paient will bathe and dress lower body, sitting and standing, prn, with Min A. OT Short Term Goal 3 (Week 1): Patient will complete simple meal prep using LRAD with supervision OT Short Term Goal 4 (Week 1): Patient will demonstrate ability to perform HEP for UE strengthening with supervision  Skilled Therapeutic Interventions: ADL-retraining with emphasis on improved transfers, improved endurance, improved safety awareness, and effective use of AD/DME.   Patient required mod verbal cues and min assist to manage w/c to initiate and sustain bathing in shower due to fear of falling, general weakness, and unfamiliarity with use of DME. Patient was unsteady with use of RW but improved sit>stand with use of HD RW.    Patient completed bathing/dressing sitting/standing using grab bars with mod assist to pull up pants and to apply sock to left foot.  Patient benefits from repeated instruction on use of DME and AD but requires close supervision due to possible short-term memory deficits.        Therapy Documentation Precautions:  Precautions Precautions: Fall Precaution Comments: No NWB orders for Rt. LE, per typical protocol pt NWB on Rt. LE - requested order from RN.  Restrictions Weight Bearing Restrictions: Yes RLE Weight Bearing: Non weight bearing  Pain: No pain    See FIM for current functional status  Therapy/Group: Individual Therapy  Ebon Ketchum 05/15/2013, 1:50 PM
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
## 2013-05-15 NOTE — Progress Notes (Signed)
Called to patient's room by patient, wanted to discuss reason for nausea, wanted to know what received for pain. Discussed that patient had scheduled Norco which is hydrocodone and tylenol, patient stated she did not want to take that again as it had codeine in it and it makes her sick and have hives. Discussed using zofran and ultram as she was taking the ultram at home. Paged Jesusita Oka and discussed above with him, did not d/c Norco, stated " she can refuse it if she wants too, really limited with her allergies" patient made aware of plan of care, verbalized understanding. Roberts-VonCannon, Sanav Remer Elon Jester

## 2013-05-15 NOTE — Progress Notes (Signed)
Occupational Therapy Session Note  Patient Details  Name: [name] [name] MRN: [id number] Date of Birth: [birth year]  Today's Date: 05/15/2013 Time: 1191-4782 Time Calculation (min): 45 min  Short Term Goals: Week 1:  OT Short Term Goal 1 (Week 1): Patient will complete tub transfer to standard tub with tub bench using LRAD with Min A OT Short Term Goal 2 (Week 1): Paient will bathe and dress lower body, sitting and standing, prn, with Min A. OT Short Term Goal 3 (Week 1): Patient will complete simple meal prep using LRAD with supervision OT Short Term Goal 4 (Week 1): Patient will demonstrate ability to perform HEP for UE strengthening with supervision  Skilled Therapeutic Interventions/Progress Updates:    1:1 Self care: focus on transfer training with RW performing stand step pivots and sit to stands with RW from different height surfaces (ie bed, w/c, and different mat heights).  Therapeutic exercise: perform UE exercises with 2 kg yellow ball and 2-4 lb weights in all planes with 15 reps for each exercise (bicep, tricep, deltoids, pectoralis, traps) to continue to work on UE strengthening to improve her ability to perform sit to stands and basic transfers.   Therapy Documentation Precautions:  Precautions Precautions: Fall Precaution Comments: No NWB orders for Rt. LE, per typical protocol pt NWB on Rt. LE - requested order from RN.  Restrictions Weight Bearing Restrictions: Yes RLE Weight Bearing: Non weight bearing Pain:  no c/o pain in session   See FIM for current functional status  Therapy/Group: Individual Therapy  Roney Mans [facility] 05/15/2013, 2:42 PM

## 2013-05-15 NOTE — Telephone Encounter (Signed)
PT NOTIFIED OF RESULTS AND MEDICATION ORDER BY LISA COX

## 2013-05-15 NOTE — Progress Notes (Signed)
Social Work Patient ID: Leisure centre manager, female   DOB: 12-23-1955, 57 y.o.   MRN: [id number]   Met with pt yesterday following team conference.  Aware and agreeable with targeted d/c date of 12/19 with mod i goals overall.  Denies any concerns/ questions at this time - will follow for support and further d/c planning.  Nemiah Kissner, LCSW

## 2013-05-15 NOTE — Progress Notes (Signed)
Physical Therapy Note  Patient Details  Name: [name] [name] MRN: [id number] Date of Birth: 03-Jan-1956 Today's Date: 05/15/2013  1130-1155 (25 minutes) individual Pain: no complaint of pain Other: Pt reports moderate nausea/ nursing aware : BP in supine 134/79 , pulse 97 Focus of treatment: Bilateral LE AROM/strengthening including RT BKA exercises X 20 Treatment: Pt in bed upon arrival reporting nausea as above; pt instructed and re- demonstrated the following X 20 : glut sets, quad sets, hip flexion /extension, SAQs, ankle pumps on left ; pt remained in bed with all needs within reach.    1400-1455 (55 minutes) individual Pain: c/o intermittent  RT "heel" pain 5/10/premedicated Focus of treatment: gait training in parallel bars; left quad strengthening; bilateral UE strengthening to improve transfers/gait Treatment: Pt up in wc upon arrival; wc pushups 4 X 5 with assist ; LAQs left x 20 with 5 #; stepping forward and backward in parallel bars focusing on maintaining left knee extension (pt unable to maintain terminal knee extension when stepping) ; wc mobility - 150 feet X 2 SBA using bilateral UEs / left LE.   [name],JIM 05/15/2013, 11:58 AM

## 2013-05-15 NOTE — Progress Notes (Signed)
Inpatient Rehabilitation Center Individual Statement of Services  Patient Name:  [name] [name]  Date:  05/15/2013  Welcome to the Inpatient Rehabilitation Center.  Our goal is to provide you with an individualized program based on your diagnosis and situation, designed to meet your specific needs.  With this comprehensive rehabilitation program, you will be expected to participate in at least 3 hours of rehabilitation therapies Monday-Friday, with modified therapy programming on the weekends.  Your rehabilitation program will include the following services:  Physical Therapy (PT), Occupational Therapy (OT), 24 hour per day rehabilitation nursing, Therapeutic Recreaction (TR), Neuropsychology, Case Management (Social Worker), Rehabilitation Medicine, Nutrition Services and Pharmacy Services  Weekly team conferences will be held on Tuesdays to discuss your progress.  Your Social Worker will talk with you frequently to get your input and to update you on team discussions.  Team conferences with you and your family in attendance may also be held.  Expected length of stay: 10-14 days  Overall anticipated outcome: mod independent  Depending on your progress and recovery, your program may change. Your Social Worker will coordinate services and will keep you informed of any changes. Your Social Worker's name and contact numbers are listed  below.  The following services may also be recommended but are not provided by the Inpatient Rehabilitation Center:   Driving Evaluations  Home Health Rehabiltiation Services  Outpatient Rehabilitation Services    Arrangements will be made to provide these services after discharge if needed.  Arrangements include referral to agencies that provide these services.  Your insurance has been verified to be:  Medicare Your primary doctor is:  Dr. Cathleen Corti  Pertinent information will be shared with your doctor and your insurance company.  Social Worker:   [place], [place] [phone] or [phone]   Information discussed with and copy given to patient by: Amada Jupiter, 05/15/2013, 11:12 AM

## 2013-05-16 ENCOUNTER — Inpatient Hospital Stay (HOSPITAL_COMMUNITY): Payer: Medicare Other | Admitting: Rehabilitation

## 2013-05-16 ENCOUNTER — Inpatient Hospital Stay (HOSPITAL_COMMUNITY): Payer: Medicare Other

## 2013-05-16 ENCOUNTER — Inpatient Hospital Stay (HOSPITAL_COMMUNITY): Payer: Medicare Other | Admitting: Physical Therapy

## 2013-05-16 DIAGNOSIS — S88119A Complete traumatic amputation at level between knee and ankle, unspecified lower leg, initial encounter: Secondary | ICD-10-CM

## 2013-05-16 DIAGNOSIS — F323 Major depressive disorder, single episode, severe with psychotic features: Secondary | ICD-10-CM

## 2013-05-16 LAB — GLUCOSE, CAPILLARY
Glucose-Capillary: 168 mg/dL — ABNORMAL HIGH (ref 70–99)
Glucose-Capillary: 170 mg/dL — ABNORMAL HIGH (ref 70–99)

## 2013-05-16 NOTE — Progress Notes (Signed)
Physical Therapy Session Note  Patient Details  Name: [name] [name] MRN: [id number] Date of Birth: [birth year]  Today's Date: 05/16/2013 Time: 4098-1191 Time Calculation (min): 33 min  Short Term Goals: Week 1:     Skilled Therapeutic Interventions/Progress Updates:   Pt received sitting in w/c in room and agreeable to therapy.  Note that pt missed 12 mins of this therapy session due to meeting with neuro psych due to suicidal ideations earlier in day.  Note her mood and behavior were normal during my session.  She did apologize during my session about "earlier."  Assured her that it was okay and that she is doing well with therapy and to continue to work hard to return home.  Pt self propelled to/from gym using BUE's and LLE at supervision level.  She requires mod cues for negotiating w/c backwards, however does well forward and also manages parts well during session with very min cues.  Performed stand pivot with bari RW w/c to mat at min assist and cues for hand placement and safety, as well as keeping RW with her instead of pushing to side.  Performed lateral scoot transfer back to w/c at supervision level with cues for managing arm rest.  While on mat, performed supine LLE bridging x 15 reps, SLR RLE x 15 reps, SL hip abd x 15 reps with assist for maintaining full SL position and SL hip ext x 15 reps.  Performed single rep of sit <> stand at min/guard assist.  She was only able to tolerate approx 1 and a half min of standing before she began feeling light headed.  Note BP was 86/56 per dynamap and 90/60 manually.  RN notified.  Pt left in room in w/c with sitter present and all needs in reach.  Continue to educate on pressure relief technique, referring to primary therapists sign in room.  Pt verbalized understanding.    Therapy Documentation Precautions:  Precautions Precautions: Fall Precaution Comments: No NWB orders for Rt. LE, per typical protocol pt NWB on Rt. LE - requested order  from RN.  Restrictions Weight Bearing Restrictions: Yes RLE Weight Bearing: Non weight bearing General: Amount of Missed PT Time (min): 12 Minutes Missed Time Reason: Other (comment) (meeting with Neuro pscyh due to suicidal ideations) Vital Signs: Therapy Vitals Temp: 98.8 F (37.1 C) Temp src: Oral Pulse Rate: 94 Resp: 18 BP: 113/67 mmHg Patient Position, if appropriate: Sitting Oxygen Therapy SpO2: 99 % O2 Device: None (Room air) Pain: Pt states mild burning sensation at end of residual limb during activity, esp standing.  Better with rest and repositioning.  See FIM for current functional status  Therapy/Group: Individual Therapy  Vista Deck 05/16/2013, 4:29 PM

## 2013-05-16 NOTE — Consult Note (Signed)
Reason for Consult: Suicidal ideation and S/P right BKA  Referring Physician: Ranelle Oyster, MD   [name] [name] is an 57 y.o. female.  HPI: Patient is seen and chart reviewed. Case discussed with Dr. Riley Kill. [name] [name] is a 57 y.o. Caucasian female  admitted to the [facility] rehabilitation services after right BKA secondary to uncontrolled diabetes mellitus as well as peripheral vascular disease.  Patient was initiated admitted 05/09/2013 with nonhealing right foot ulcer as well as fever.  Patient has been depressed, anxious, dysphoric and suicidal ideation with plan and intervention which she does not want to tell anybody. 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 Patient was angry and upset and talked about her daughter who was not supportive to her. Patient stated her suicide plan does not include gun. Patient  was receiving a lot of attention since she has been making a suicide threat in the rehabilitation unit. Patient does not contract for safety at this time and also reluctant to medication management based on her anaphylactic reaction to pain medications in the past. Patient stated she was tired of taking a lot of medication and she does not want take medication anymore. Patient has no previous history of mental illness and inpatient psychiatric services   Medical history: Reportedly she had reportedly stepped on a piece of glass back in September of 2014 with poor wound healing. She had been undergoing wound care by podiatry since September. MRI of the right foot showed marked soft tissue swelling and air in the plantar surface of the right foot consistent with infection. 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 Lajoyce Corners. Postoperative pain management. Subcutaneous heparin added for DVT prophylaxis. Hemoglobin A1c of 10.8 with insulin  therapy as directed. Physical therapy evaluation completed 05/12/2013 with recommendations for physical medicine rehabilitation consult to consider inpatient rehabilitation services.Patient was felt to be a good candidate for inpatient rehabilitation services and was admitted for a comprehensive rehabilitation program   Mental Status Examination: Patient appeared as per his stated age, appeared sitting in a chair and keeping her right BKA with support on a separate table and safety sitter at bedside. Patient has good eye contact. Patient has depressed  mood and his affect was appropriate and congruent with her mood.  she has normal rate, rhythm, and volume of speech. His thought process is linear and goal directed. Patient has suicidal ideation, intention or plan but denied homicidal ideations, intentions or plans. Patient has no evidence of auditory or visual hallucinations, delusions, and paranoia. Patient has fair insight judgment and impulse control.  Past Medical History  Diagnosis Date  . Dizziness   . Fatigue   . Swelling   . Depression   . Allergy     sinus problems  . IBS (irritable bowel syndrome)   . GERD (gastroesophageal reflux disease)   . Neuromuscular disorder   . Ulcer   . Complication of anesthesia     "[name]" (05/09/2013)  . PONV (postoperative nausea and vomiting)   . Poor circulation   . Varicose vein of leg   . Type II diabetes mellitus   . Migraines     "hits me here and there" (05/09/2013)  . Arthritis     "both hands"   . Charcot's joint of foot     "both feet"   .  Fibromyalgia   . Chronic lower back pain     "2 cracked vertebra; 3 herniated discs" (05/09/2013)    Past Surgical History  Procedure Laterality Date  . Cesarean section  1980; 1996  . Bunionectomy Right   . Cholecystectomy    . Tubal ligation  1996  . Retinal detachment surgery Left ~ 2012  . Varicose vein surgery Left ~ 2003    "it was full of blood clots"  (05/09/2013)  . Amputation Right 05/11/2013    Procedure: AMPUTATION BELOW KNEE;  Surgeon: Nadara Mustard, MD;  Location: [facility] OR;  Service: Orthopedics;  Laterality: Right;    Family History  Problem Relation Age of Onset  . Cancer Father     Social History:  reports that she has quit smoking. Her smoking use included Cigarettes. She has a 3.75 pack-year smoking history. She has never used smokeless tobacco. She reports that she does not drink alcohol or use illicit drugs.  Allergies:  Allergies  Allergen Reactions  . Codeine Anaphylaxis and Hives  . Darvocet [Propoxyphene-Acetaminophen] Anaphylaxis  . Demerol [Meperidine] Anaphylaxis  . Ibuprofen Anaphylaxis  . Iodine Anaphylaxis  . Naproxen Anaphylaxis and Hives  . Penicillins Anaphylaxis  . Sulfa Antibiotics Nausea And Vomiting  . Hydrocodone Hives and Nausea And Vomiting  . Other     Bee stings and medication in the cain family  . Oxycodone Hives, Nausea And Vomiting and Swelling  . Tape     Adhesive tapes    Medications: I have reviewed the patient's current medications.  Results for orders placed during the hospital encounter of 05/13/13 (from the past 48 hour(s))  GLUCOSE, CAPILLARY     Status: Abnormal   Collection Time    05/14/13  4:35 PM      Result Value Range   Glucose-Capillary 101 (*) 70 - 99 mg/dL  GLUCOSE, CAPILLARY     Status: Abnormal   Collection Time    05/14/13  8:50 PM      Result Value Range   Glucose-Capillary 190 (*) 70 - 99 mg/dL  GLUCOSE, CAPILLARY     Status: Abnormal   Collection Time    05/15/13  7:26 AM      Result Value Range   Glucose-Capillary 167 (*) 70 - 99 mg/dL   Comment 1 Notify RN    GLUCOSE, CAPILLARY     Status: Abnormal   Collection Time    05/15/13 11:23 AM      Result Value Range   Glucose-Capillary 137 (*) 70 - 99 mg/dL   Comment 1 Notify RN    GLUCOSE, CAPILLARY     Status: Abnormal   Collection Time    05/15/13  4:58 PM      Result Value Range   Glucose-Capillary  116 (*) 70 - 99 mg/dL  GLUCOSE, CAPILLARY     Status: Abnormal   Collection Time    05/15/13  8:14 PM      Result Value Range   Glucose-Capillary 109 (*) 70 - 99 mg/dL   Comment 1 Notify RN    GLUCOSE, CAPILLARY     Status: Abnormal   Collection Time    05/16/13  7:25 AM      Result Value Range   Glucose-Capillary 168 (*) 70 - 99 mg/dL   Comment 1 Notify RN    GLUCOSE, CAPILLARY     Status: Abnormal   Collection Time    05/16/13 11:18 AM      Result Value Range  Glucose-Capillary 212 (*) 70 - 99 mg/dL   Comment 1 Notify RN      No results found.  Positive for anxiety, bad mood, depression and sleep disturbance Blood pressure 113/67, pulse 94, temperature 98.8 F (37.1 C), temperature source Oral, resp. rate 18, weight 90.311 kg (199 lb 1.6 oz), SpO2 99.00%.   Assessment/Plan: Major depression disorder, single, severe without psychosis  Recommendation: Recommended continue safety sitter one-to-one Continue individual counseling as planned Offered medication for depression but patient is reluctant to start Patient needs involuntary commitment petition for acute psychiatric hospitalization Recommend acute psychiatric hospitalization for crisis stabilization, safety monitoring and medication management in medically stable Appreciate psychiatric consultation and followup as clinically needed  Travonte Byard,JANARDHAHA R. 05/16/2013, 3:04 PM

## 2013-05-16 NOTE — Progress Notes (Signed)
Patient frustrated with hospital course as well as family dynamics making comments to nursing staff of wishing to die and suicidal ideations. Sit and has been provided at bedside. Will consult psychiatry services for recommendations

## 2013-05-16 NOTE — Progress Notes (Signed)
Pt returned from therapy at 0830 with nursing staff in room.  Per therapy, pt emotional during session.  I sat with pt to discuss medication and pain as well as her nausea that she previously expressed to me.  I left room to get pt a diet ginger ale to ease her stomach and returned to pt sobbing in wheel chair.  I asked pt what was wrong and she stated "I wish I was dead. I can't wait to kill myself."  I asked pt to please talk about how she feels and why she feels that way. Pt stated "I wish when my leg was cut off I died on the table. When I leave first thing I am going to do is kill myself. You better believe it."  I asked NT to stay in room with pt while I notified Deatra Ina, PA whom notified Riley Kill, MD.  Bayard Hugger and Deatra Ina talked with pt and discussed issue and plan of care. Suicide protocol in place with house notified to send suicide sitter. Continue to monitor pt.

## 2013-05-16 NOTE — Progress Notes (Addendum)
Physical Therapy Session Note  Patient Details  Name: Blessings Inglett MRN: [id number] Date of Birth: 1956/03/04  Today's Date: 05/16/2013 Time: 0730-0830 Time Calculation (min): 60 min   Skilled Therapeutic Interventions/Progress Updates:    Pt emotional at start of treatment, eventually stated she just didn't know "how this is going to work," and that she "can't picture myself managing on my own." Pt provided emotional support. Session focused on managing from wheelchair level in the home, practicing in ADL apartment. Pt practicing negotiating wheelchair and parts management in kitchen and bedroom for transfers, mod verbal cues overall and min assist for transfers. Wheelchair propulsion in controlled and home environment S/min assist. Pt fatigues quickly. Reviewed pressure relief and positioning of Rt. LE. RN made aware of pt's emotional state.  I will contact Amputee support group to see if a peer mentor is available.   Therapy Documentation Precautions:  Precautions Precautions: Fall Precaution Comments: No NWB orders for Rt. LE, per typical protocol pt NWB on Rt. LE - requested order from RN.  Restrictions Weight Bearing Restrictions: Yes RLE Weight Bearing: Non weight bearing Pain:  no c/o during session  See FIM for current functional status  Therapy/Group: Individual Therapy  Wilhemina Bonito 05/16/2013, 12:29 PM

## 2013-05-16 NOTE — Progress Notes (Signed)
Occupational Therapy Session Note  Patient Details  Name: [name] [name] MRN: [id number] Date of Birth: [birth year]  Today's Date: 05/16/2013 Time: 1000-1100 60 min  Short Term Goals: Week 1:  OT Short Term Goal 1 (Week 1): Patient will complete tub transfer to standard tub with tub bench using LRAD with Min A OT Short Term Goal 2 (Week 1): Paient will bathe and dress lower body, sitting and standing, prn, with Min A. OT Short Term Goal 3 (Week 1): Patient will complete simple meal prep using LRAD with supervision OT Short Term Goal 4 (Week 1): Patient will demonstrate ability to perform HEP for UE strengthening with supervision  Skilled Therapeutic Interventions: ADL-retraining with emphasis on dynamic sitting balance, improved affect, and therapeutic benefit of completing ADL.   Patient noted on suicide precaution per RN staff at beginning of session with ongoing emotional lability.   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   Patient expressed gratitude for recommendations and proceeded through upper body bathing and dressing session, deferring lower body bathing and dressing due to previous assist with toilet hygiene from RN staff.    Sitter returned to continue supervision of patient behaviors at end of OT session.    Therapy Documentation Precautions:  Precautions Precautions: Fall Precaution Comments: No NWB orders for Rt. LE, per typical protocol pt NWB on Rt. LE - requested order from RN.  Restrictions Weight Bearing Restrictions: Yes RLE Weight Bearing: Non weight bearing  Vital Signs: Therapy Vitals Temp: 97.8 F (36.6 C) Temp src: Oral Pulse Rate: 84 Resp:  19 BP: 134/80 mmHg Patient Position, if appropriate: Lying Oxygen Therapy SpO2: 100 % O2 Device: None (Room air)  Pain: Pain Assessment Pain Assessment: 0-10 Pain Score: 6  Pain Type: Acute pain Pain Location: Leg Pain Orientation: Right Pain Descriptors / Indicators: Aching Pain Frequency: Intermittent Pain Onset: Gradual Patients Stated Pain Goal: 3 Pain Intervention(s): Medication (See eMAR);Emotional support Multiple Pain Sites: No  See FIM for current functional status  Therapy/Group: Individual Therapy  Second session: Time: 1130-1200 Time Calculation (min): 30 min  Pain Assessment: No pain  Skilled Therapeutic Interventions: Therapeutic activities with emphasis on grip/pinch strengthening using soft thera-putty.   Grip and pinch strength assessed using Jamar Hand dyno and pinch gage as follows: R- Grip = 35 lbs L-Grip = 33 lbs  R-Key pinch = 10 lbs R-tip pinch = 4 lbs R-Palmar pinch = 6 lbs  L-Key pinch = 9 lbs L-tip pinch = 4 lbs L-Palmar pinch = 5 lbs  With supervision and 2 demonstrations, patient completed 3 of 14 grip/pinch exercises as instructed using Yellow soft putty.  See FIM for current functional status  Therapy/Group: Individual Therapy  [name] 05/16/2013, 7:14 AM

## 2013-05-16 NOTE — IPOC Note (Signed)
Overall Plan of Care [facility]) Patient Details Name: [name] [name] MRN: [id number] DOB: 09/29/1955  Admitting Diagnosis: R BKA  Hospital Problems: Active Problems:   Hx of BKA     Functional Problem List: Nursing Endurance;Medication Management;Pain;Nutrition;Safety;Skin Integrity;Bladder;Bowel;Edema;Sensory  PT Balance;Endurance;Pain;Safety;Sensory;Skin Integrity  OT Balance;Endurance;Pain;Safety  SLP    TR         Basic ADL's: OT Bathing;Dressing;Toileting     Advanced  ADL's: OT Simple Meal Preparation;Light Housekeeping     Transfers: PT Bed Mobility;Bed to Chair;Car;Furniture;Floor  OT Toilet;Tub/Shower     Locomotion: PT Ambulation;Wheelchair Mobility     Additional Impairments: OT None  SLP        TR      Anticipated Outcomes Item Anticipated Outcome  Self Feeding Independent  Swallowing      Basic self-care  Mod I  Toileting  Mod I   Bathroom Transfers Mod I  Bowel/Bladder  continent of bowel and bladder modified independence  Transfers  Modified independent  Locomotion  Modified independent weelchair level, min assist short distance gait  Communication     Cognition     Pain  3 or less on scale of 1-10  Safety/Judgment  supervision   Therapy Plan: PT Intensity: Minimum of 1-2 x/day ,45 to 90 minutes PT Frequency: 5 out of 7 days PT Duration Estimated Length of Stay: 10-14 days OT Intensity: Minimum of 1-2 x/day, 45 to 90 minutes OT Frequency: 5 out of 7 days OT Duration/Estimated Length of Stay: 7-10 days         Team Interventions: Nursing Interventions Patient/Family Education;Bladder Management;Bowel Management;Disease Management/Prevention;Pain Management;Medication Management;Skin Care/Wound Management;Discharge Planning;Psychosocial Support  PT interventions Ambulation/gait training;Balance/vestibular training;Disease management/prevention;Discharge planning;Community reintegration;DME/adaptive equipment  instruction;Functional mobility training;Patient/family education;Splinting/orthotics;Therapeutic Exercise;UE/LE Coordination activities;Therapeutic Activities;Skin care/wound management;Pain management;Neuromuscular re-education;Psychosocial support;UE/LE Strength taining/ROM;Wheelchair propulsion/positioning  OT Interventions Discharge planning;Balance/vestibular training;Functional mobility training;Patient/family education;Pain management;Self Care/advanced ADL retraining;Therapeutic Activities;Therapeutic Exercise;UE/LE Strength taining/ROM;Wheelchair propulsion/positioning;DME/adaptive equipment instruction;Disease mangement/prevention  SLP Interventions    TR Interventions    SW/CM Interventions Discharge Planning;Psychosocial Support;Patient/Family Education    Team Discharge Planning: Destination: PT-Home ,OT-   , SLP-  Projected Follow-up: PT-Home health PT;Other (comment) (intermittent supervision), OT-   , SLP-  Projected Equipment Needs: PT-Rolling walker with 5" wheels;Wheelchair cushion (measurements);Wheelchair (measurements), OT- 3 in 1 bedside comode;Tub/shower seat, SLP-  Equipment Details: PT- , OT-  Patient/family involved in discharge planning: PT- Patient,  OT-Patient, SLP-   MD ELOS: 7-10 d Medical Rehab Prognosis:  Good Assessment: 57 y.o. right-handed female with history of uncontrolled diabetes mellitus as well as peripheral vascular disease. Presented 05/09/2013 with nonhealing right foot ulcer as well as fever. Patient had reportedly stepped on a piece of glass back in September of 2014 with poor wound healing. She had been undergoing wound care by podiatry since September. MRI of the right foot showed marked soft tissue swelling and air in the plantar surface of the right foot consistent with infection. 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 Lajoyce Corners. Postoperative pain management. Subcutaneous heparin added for DVT  prophylaxis. Hemoglobin A1c of 10.8 with insulin therapy   Now requiring 24/7 Rehab RN,MD, as well as CIR level PT, OT and SLP.  Treatment team will focus on ADLs and mobility with goals set at Mod I levels   See Team Conference Notes for weekly updates to the plan of care

## 2013-05-16 NOTE — Progress Notes (Signed)
Subjective/Complaints: Tearful this am but won't disclose exactly why. Denies current pain. bp better yesterday A 12 point review of systems has been performed and if not noted above is otherwise negative.   Objective: Vital Signs: Blood pressure 134/80, pulse 84, temperature 97.8 F (36.6 C), temperature source Oral, resp. rate 19, weight 90.311 kg (199 lb 1.6 oz), SpO2 100.00%. No results found.  Recent Labs  05/13/13 1805 05/14/13 0610  WBC 8.2 7.0  HGB 10.1* 9.7*  HCT 30.2* 29.6*  PLT 246 233    Recent Labs  05/13/13 1805 05/14/13 0610  NA  --  140  K  --  4.1  CL  --  103  GLUCOSE  --  194*  BUN  --  15  CREATININE 0.71 0.52  CALCIUM  --  8.2*   CBG (last 3)   Recent Labs  05/15/13 1658 05/15/13 2014 05/16/13 0725  GLUCAP 116* 109* 168*    Wt Readings from Last 3 Encounters:  05/15/13 90.311 kg (199 lb 1.6 oz)  05/09/13 90.8 kg (200 lb 2.8 oz)  05/09/13 90.8 kg (200 lb 2.8 oz)    Physical Exam:  Constitutional: She is oriented to person, place, and time.  HENT:  Head: Normocephalic and atraumatic.  Right Ear: External ear normal.  Left Ear: External ear normal.  Eyes: EOM are normal.  Neck: Normal range of motion. Neck supple. No JVD present. No tracheal deviation present. No thyromegaly present.  Cardiovascular: Normal rate and regular rhythm. No murmurs or rubs  Respiratory: Effort normal and breath sounds normal. No respiratory distress.  GI: Soft. Bowel sounds are normal. She exhibits no distension.  Musculoskeletal:  Right leg in post-op, coban dressing. Lymphadenopathy:  She has no cervical adenopathy.  Neurological: She is alert and oriented to person, place, and time. She displays normal reflexes. No cranial nerve deficit. Coordination normal.  Has difficulty moving right leg due to pain.  Skin:  Postoperative dressing to BKA site. No visible drainage. Remaining skin intact Psychiatric: She has a normal mood and affect. Her behavior  is normal. Judgment and thought content normal    Assessment/Plan: 1. Functional deficits secondary to right BKA which require 3+ hours per day of interdisciplinary therapy in a comprehensive inpatient rehab setting. Physiatrist is providing close team supervision and 24 hour management of active medical problems listed below. Physiatrist and rehab team continue to assess barriers to discharge/monitor patient progress toward functional and medical goals. FIM: FIM - Bathing Bathing Steps Patient Completed: Chest;Right Arm;Left Arm;Abdomen;Front perineal area;Buttocks;Right upper leg;Left upper leg;Left lower leg (including foot) Bathing: 5: Supervision: Safety issues/verbal cues  FIM - Upper Body Dressing/Undressing Upper body dressing/undressing steps patient completed: Thread/unthread right sleeve of pullover shirt/dresss;Thread/unthread left sleeve of pullover shirt/dress;Put head through opening of pull over shirt/dress;Pull shirt over trunk Upper body dressing/undressing: 5: Supervision: Safety issues/verbal cues FIM - Lower Body Dressing/Undressing Lower body dressing/undressing steps patient completed: Thread/unthread right underwear leg;Thread/unthread left underwear leg;Thread/unthread right pants leg;Thread/unthread left pants leg Lower body dressing/undressing: 3: Mod-Patient completed 50-74% of tasks  FIM - Toileting Toileting steps completed by patient: Adjust clothing prior to toileting;Performs perineal hygiene;Adjust clothing after toileting Toileting Assistive Devices: Grab bar or rail for support (One assist to wheelchair/BSC) Toileting: 4: Steadying assist  FIM - Diplomatic Services operational officer Devices: Bedside commode Toilet Transfers: 6-Assistive device: No helper  FIM - Banker Devices: Bed rails;Arm rests Bed/Chair Transfer: 3: Bed > Chair or W/C: Mod A (lift or lower  assist);3: Chair or W/C > Bed: Mod A (lift or  lower assist)  FIM - Locomotion: Wheelchair Locomotion: Wheelchair: 2: Travels 50 - 149 ft with minimal assistance (Pt.>75%) FIM - Locomotion: Ambulation Ambulation/Gait Assistance: 1: +2 Total assist Locomotion: Ambulation: 1: Two helpers  Comprehension Comprehension Mode: Auditory Comprehension: 7-Follows complex conversation/direction: With no assist  Expression Expression Mode: Verbal Expression: 7-Expresses complex ideas: With no assist  Social Interaction Social Interaction: 7-Interacts appropriately with others - No medications needed.  Problem Solving Problem Solving: 7-Solves complex problems: Recognizes & self-corrects  Memory Memory: 6-Assistive device: No helper  Medical Problem List and Plan:  1.Right BKA  2. DVT Prophylaxis/Anticoagulation: Subcutaneous heparin. Monitor platelet counts and any signs of bleeding  3. Pain Management:tramadol 50 mg every 6 as needed moderate pain.  Hydrocodone1 tab every 6 as needed as needed. Will schedule hydrocodone in am and at bed time each day.  -generally good control 4. Mood: Valium 2 mg every 6 as needed anxiety. Provide emotional support as able. Multiple social issues noted which affect her mood. 5. Neuropsych: This patient is capable of making decisions on her own behalf.  6.Diabetes mellitus with peripheral neuropathy.Hemoglobin A1c 10.8.NovoLog 5 units 3 times a day, Lantus insulin 40 units twice a day. Check blood sugars a.c. And at bedtime.  -sugars labile but seem to be normalizing. 7.Hypertension.: Coreg 3.125 mg twice a day,lisinopril 10 mg daily.--hold lisinopril given hypotension yesterday 8.Tobacco abuse. Counseling   LOS (Days) 3 A FACE TO FACE EVALUATION WAS PERFORMED  Sherrod Toothman T 05/16/2013 7:39 AM

## 2013-05-17 ENCOUNTER — Encounter (HOSPITAL_COMMUNITY): Payer: Medicare Other

## 2013-05-17 ENCOUNTER — Inpatient Hospital Stay (HOSPITAL_COMMUNITY): Payer: Medicare Other | Admitting: Physical Therapy

## 2013-05-17 ENCOUNTER — Inpatient Hospital Stay (HOSPITAL_COMMUNITY): Payer: Medicare Other | Admitting: *Deleted

## 2013-05-17 ENCOUNTER — Inpatient Hospital Stay (HOSPITAL_COMMUNITY): Payer: Medicare Other

## 2013-05-17 LAB — GLUCOSE, CAPILLARY
Glucose-Capillary: 118 mg/dL — ABNORMAL HIGH (ref 70–99)
Glucose-Capillary: 145 mg/dL — ABNORMAL HIGH (ref 70–99)
Glucose-Capillary: 116 mg/dL — ABNORMAL HIGH (ref 70–99)
Glucose-Capillary: 134 mg/dL — ABNORMAL HIGH (ref 70–99)

## 2013-05-17 MED ORDER — NORTRIPTYLINE HCL 25 MG PO CAPS
25.0000 mg | ORAL_CAPSULE | Freq: Every day | ORAL | Status: DC
  Administered 2013-05-17 – 2013-05-20 (×4): 25 mg via ORAL
  Filled 2013-05-17 (×5): qty 1

## 2013-05-17 NOTE — Progress Notes (Signed)
Subjective/Complaints: Pt in better spirits today. Had a lot of shooting, phantom pain last night.  A 12 point review of systems has been performed and if not noted above is otherwise negative.   Objective: Vital Signs: Blood pressure 99/62, pulse 93, temperature 98.2 F (36.8 C), temperature source Oral, resp. rate 20, weight 90.311 kg (199 lb 1.6 oz), SpO2 97.00%. No results found. No results found for this basename: WBC, HGB, HCT, PLT,  in the last 72 hours No results found for this basename: NA, K, CL, CO, GLUCOSE, BUN, CREATININE, CALCIUM,  in the last 72 hours CBG (last 3)   Recent Labs  05/16/13 1640 05/16/13 2146 05/17/13 0720  GLUCAP 85 170* 118*    Wt Readings from Last 3 Encounters:  05/15/13 90.311 kg (199 lb 1.6 oz)  05/09/13 90.8 kg (200 lb 2.8 oz)  05/09/13 90.8 kg (200 lb 2.8 oz)    Physical Exam:  Constitutional: She is oriented to person, place, and time.  HENT:  Head: Normocephalic and atraumatic.  Right Ear: External ear normal.  Left Ear: External ear normal.  Eyes: EOM are normal.  Neck: Normal range of motion. Neck supple. No JVD present. No tracheal deviation present. No thyromegaly present.  Cardiovascular: Normal rate and regular rhythm. No murmurs or rubs  Respiratory: Effort normal and breath sounds normal. No respiratory distress.  GI: Soft. Bowel sounds are normal. She exhibits no distension.  Musculoskeletal:  Right leg in post-op, coban dressing. Lymphadenopathy:  She has no cervical adenopathy.  Neurological: She is alert and oriented to person, place, and time. She displays normal reflexes. No cranial nerve deficit. Coordination normal. Can move right leg with assistance  Skin:  Postoperative dressing to BKA site. No visible drainage. Remaining skin intact Psychiatric: she remains a little anxious, but in much better spirits. Non-tearful.      Assessment/Plan: 1. Functional deficits secondary to right BKA which require 3+ hours  per day of interdisciplinary therapy in a comprehensive inpatient rehab setting. Physiatrist is providing close team supervision and 24 hour management of active medical problems listed below. Physiatrist and rehab team continue to assess barriers to discharge/monitor patient progress toward functional and medical goals.  FIM: FIM - Bathing Bathing Steps Patient Completed: Chest;Right Arm;Left Arm;Abdomen;Front perineal area;Buttocks;Right upper leg;Left upper leg;Left lower leg (including foot) Bathing: 5: Supervision: Safety issues/verbal cues  FIM - Upper Body Dressing/Undressing Upper body dressing/undressing steps patient completed: Thread/unthread right sleeve of pullover shirt/dresss;Thread/unthread left sleeve of pullover shirt/dress;Put head through opening of pull over shirt/dress;Pull shirt over trunk Upper body dressing/undressing: 5: Supervision: Safety issues/verbal cues FIM - Lower Body Dressing/Undressing Lower body dressing/undressing steps patient completed: Thread/unthread right underwear leg;Thread/unthread left underwear leg;Thread/unthread right pants leg;Thread/unthread left pants leg Lower body dressing/undressing: 3: Mod-Patient completed 50-74% of tasks  FIM - Toileting Toileting steps completed by patient: Adjust clothing prior to toileting;Performs perineal hygiene;Adjust clothing after toileting Toileting Assistive Devices: Grab bar or rail for support (One assist to wheelchair/BSC) Toileting: 4: Steadying assist  FIM - Diplomatic Services operational officer Devices: Psychiatrist Transfers: 6-Assistive device: No helper  FIM - Banker Devices: Arm rests Bed/Chair Transfer: 4: Supine > Sit: Min A (steadying Pt. > 75%/lift 1 leg);5: Sit > Supine: Supervision (verbal cues/safety issues);4: Bed > Chair or W/C: Min A (steadying Pt. > 75%);4: Chair or W/C > Bed: Min A (steadying Pt. > 75%)  FIM - Locomotion:  Wheelchair Locomotion: Wheelchair: 2: Travels 50 - 149 ft with  minimal assistance (Pt.>75%) FIM - Locomotion: Ambulation Ambulation/Gait Assistance: 1: +2 Total assist Locomotion: Ambulation: 1: Two helpers  Comprehension Comprehension Mode: Auditory Comprehension: 6-Follows complex conversation/direction: With extra time/assistive device  Expression Expression Mode: Verbal Expression: 7-Expresses complex ideas: With no assist  Social Interaction Social Interaction: 5-Interacts appropriately 90% of the time - Needs monitoring or encouragement for participation or interaction.  Problem Solving Problem Solving: 6-Solves complex problems: With extra time  Memory Memory: 6-More than reasonable amt of time  Medical Problem List and Plan:  1.Right BKA  2. DVT Prophylaxis/Anticoagulation: Subcutaneous heparin. Monitor platelet counts and any signs of bleeding  3. Pain Management:tramadol 50 mg every 6 as needed moderate pain.  Hydrocodone1 tab every 6 as needed as needed. Will schedule hydrocodone in am and at bed time each day.  -add pamelor 25mg  qhs for phantom limb pain. May help mood as well. 4. Mood: Valium 2 mg every 6 as needed anxiety. . Multiple social issues noted which affect her mood.  -continue 1:1 observation today. i don't believe she is a threat to harm self while here in hospital  -in much better spirits today  -appreciate psych eval. i am not sure what exactly his recommendation is however. She will definitely need outpt follow-up vs hospitalization.   5. Neuropsych: This patient is capable of making decisions on her own behalf.  6.Diabetes mellitus with peripheral neuropathy.Hemoglobin A1c 10.8.NovoLog 5 units 3 times a day, Lantus insulin 40 units twice a day. Check blood sugars a.c. And at bedtime.  -sugars normalizing---follow for pattern 7.Hypertension.: Coreg 3.125 mg twice a day,lisinopril 10 mg daily.--hold lisinopril given hypotension  8.Tobacco abuse.  Counseling   LOS (Days) 4 A FACE TO FACE EVALUATION WAS PERFORMED  Ranyah Groeneveld T 05/17/2013 8:42 AM

## 2013-05-17 NOTE — Progress Notes (Signed)
Patient alert and verbally responsive. Patient's spouse visited.Patient expressing her overwhelming happiness. She states that "after several months my husband finally kissed and hugged-me and showing concerned.  Patient no s/s of  suicidal thoughts noted at this time". Sitter at bedside 24/7 for suicidal precautions as per protocol.Patient fell asleep after giving Valium and first dose of Pamelor. No c/o of nausea and vomiting. Denies any pain. Will continue to monitor frequently.

## 2013-05-17 NOTE — Plan of Care (Signed)
Problem: RH PAIN MANAGEMENT Goal: RH STG PAIN MANAGED AT OR BELOW PT'S PAIN GOAL 3 or less on scale of 1-10  Outcome: Not Progressing Pt complains of shooting pain and spasms. Pt has bad reaction to many medication causing n/v. Pain attempted to be managed with tylenol and non medical interventions

## 2013-05-17 NOTE — Plan of Care (Signed)
Problem: RH SKIN INTEGRITY Goal: RH STG ABLE TO PERFORM INCISION/WOUND CARE W/ASSISTANCE STG Able To Perform Incision/Wound Care With Min Assistance of caregiver  Outcome: Not Progressing Stump dressing has not been changed yet; surgical dressing intact

## 2013-05-17 NOTE — Progress Notes (Signed)
Occupational Therapy Session Note  Patient Details  Name: [name] [name] MRN: [id number] Date of Birth: 1955-07-16  Today's Date: 05/17/2013 Time: 0730-0830 Time Calculation (min): 60 min  Short Term Goals: Week 1:  OT Short Term Goal 1 (Week 1): Patient will complete tub transfer to standard tub with tub bench using LRAD with Min A OT Short Term Goal 2 (Week 1): Paient will bathe and dress lower body, sitting and standing, prn, with Min A. OT Short Term Goal 3 (Week 1): Patient will complete simple meal prep using LRAD with supervision OT Short Term Goal 4 (Week 1): Patient will demonstrate ability to perform HEP for UE strengthening with supervision  Skilled Therapeutic Interventions: ADL-retraining with emphasis on functional transfers using RW (bariatric), improved endurance, and dynamic sitting balance.   Patient alert and receptive to treatment after brief remotivation provided to encourage attention to hygiene as a means to improve mood.  Patient completed bed mobility unassisted and stand pivot transfer from edge of bed to w/c with only verbal cues for correct positioning.   Patient completed additional stand-pivot transfer from w/c to tub bench with 1 demonstration and verbal cues to problem-solve management of RW.   Patient completed thorough bathing seated, with extra time required and using lateral leans to wash buttocks. Patient completed dressing in w/c after transfer from tub bench to w/c and propelled w/c from bathroom to bedside to complete self-feeding with supervision for safety.   Sitter returned to room at end of session.    Therapy Documentation Precautions:  Precautions Precautions: Fall Precaution Comments: No NWB orders for Rt. LE, per typical protocol pt NWB on Rt. LE - requested order from RN.  Restrictions Weight Bearing Restrictions: Yes RLE Weight Bearing: Non weight bearing  Pain: Pain Assessment Pain Score: 2  (intermittent pains) Pain Location:  Leg Pain Orientation: Right  See FIM for current functional status  Therapy/Group: Individual Therapy  Second session: Time: 1100-1200 Time Calculation (min):  60 min  Pain Assessment: 5/10, right  Skilled Therapeutic Interventions: Therapeutic activities with emphasis on improved endurance, functional transfer training to standard tub and tub bench, and continued grip/pinch strengthening using thera-putty.   Patient completed 15 min of bil UE ergometry using Sci-Fit (level 2.0), rating exertion as 12, per BORG scale.  Patient completed stand-pivot transfer to standard tub and tub bench with 1 demonstration and supervision for safety.   Patient returned to her room for advancement of hand/pinch exercises and completed 6 of 14 exercises with demonstration and re-ed during trial of each exercise.  See FIM for current functional status  Therapy/Group: Individual Therapy  Trentyn Boisclair 05/17/2013, 12:44 PM

## 2013-05-17 NOTE — Progress Notes (Signed)
Physical Therapy Session Note  Patient Details  Name: [name] [name] MRN: [id number] Date of Birth: June 04, 1956  Today's Date: 05/17/2013 Time: 1400-1430 Time Calculation (min): 30 min  Skilled Therapeutic Interventions/Progress Updates:    Hop gait (side hopping) to Rt./Lt. 2 x 7' with min cues for sequencing and safety, min assist. Ambulation (hop gait) x 11' with RW and min assist, cues for sequencing of RW (bariatric - pt preference due to stability). Standing balance and active Rt. Hip extension, facilitation of Rt. glutes for pre-gait training with prosthesis. Wheelchair parts management and propulsion supervision.   Therapy Documentation Precautions:  Precautions Precautions: Fall Precaution Comments: No NWB orders for Rt. LE, per typical protocol pt NWB on Rt. LE - requested order from RN.  Restrictions Weight Bearing Restrictions: Yes RLE Weight Bearing: Non weight bearing Pain:  intermittent sharp pains that have lessened since performing desensitization   See FIM for current functional status  Therapy/Group: Individual Therapy  Wilhemina Bonito 05/17/2013, 3:23 PM

## 2013-05-17 NOTE — Progress Notes (Signed)
Physical Therapy Session Note  Patient Details  Name: [name] [name] MRN: [id number] Date of Birth: [birth year]  Today's Date: 05/17/2013 Time: 0830-0926 Time Calculation (min): 56 min   Skilled Therapeutic Interventions/Progress Updates:    Pt in better spirits, discussion at beginning of session for emotional support and to affirm progress. Pt reports phantom pain kept her up all night, pt taught desensitization techniques with towel and through massage. Also practiced visualization techniques. Skin check of Lt. Foot - more discolored and decreased sensation per pt, RN made aware who reported she would notify PA.  Wheelchair propulsion 2 x 150' with supervision, min assist for management of leg rests. Stand pivot transfers to/from mat x 2 reps with RW min-guard assist! Ambulation x 9 hops then 14 hops (~6', 10' respectively) with bariatric RW (pt prefers for stability), min assist with wheelchair to follow. Pt limited by weakness and lightheadedness BP = 98/64. Stand pivot transfer to toilet with min assist and RW, cues for safety and sequencing. Left with RN.    Therapy Documentation Precautions:  Precautions Precautions: Fall Precaution Comments: No NWB Restrictions Weight Bearing Restrictions: No RLE Weight Bearing: Non weight bearing Pain: Pain Assessment Pain Score: 2  (intermittent pains) Pain Location: Leg Pain Orientation: Right  See FIM for current functional status  Therapy/Group: Individual Therapy  Wilhemina Bonito 05/17/2013, 9:54 AM

## 2013-05-17 NOTE — Plan of Care (Signed)
Problem: RH SAFETY Goal: RH STG ADHERE TO SAFETY PRECAUTIONS W/ASSISTANCE/DEVICE STG Adhere to Safety Precautions With Supervision of caregiver.  Outcome: Not Progressing Pt on suicide precautions. Sitter with pt at all times.  1:1

## 2013-05-17 NOTE — Progress Notes (Signed)
Patient's 57 year old best friend [name] [name] dropped by together with [name] [name]'s spouse and two children last night . They brought her gifts and stayed with her for a while.Patient  was overwhelming expressing her inner feelings and states that she was very happy to see them. I asked her if she's still depressed she said "no."  Patient continues to verbalize her happiness about the visit to staff. Sitter at  bedside as per suicidal  Protocol. Will continue to monitor frequently.

## 2013-05-18 ENCOUNTER — Inpatient Hospital Stay (HOSPITAL_COMMUNITY): Payer: Medicare Other | Admitting: Occupational Therapy

## 2013-05-18 ENCOUNTER — Encounter (HOSPITAL_COMMUNITY): Payer: Medicare Other | Admitting: Occupational Therapy

## 2013-05-18 ENCOUNTER — Inpatient Hospital Stay (HOSPITAL_COMMUNITY): Payer: Medicare Other | Admitting: Physical Therapy

## 2013-05-18 DIAGNOSIS — S88119A Complete traumatic amputation at level between knee and ankle, unspecified lower leg, initial encounter: Secondary | ICD-10-CM

## 2013-05-18 DIAGNOSIS — L98499 Non-pressure chronic ulcer of skin of other sites with unspecified severity: Secondary | ICD-10-CM

## 2013-05-18 DIAGNOSIS — I739 Peripheral vascular disease, unspecified: Secondary | ICD-10-CM

## 2013-05-18 DIAGNOSIS — G547 Phantom limb syndrome without pain: Secondary | ICD-10-CM

## 2013-05-18 DIAGNOSIS — D62 Acute posthemorrhagic anemia: Secondary | ICD-10-CM

## 2013-05-18 LAB — GLUCOSE, CAPILLARY
Glucose-Capillary: 128 mg/dL — ABNORMAL HIGH (ref 70–99)
Glucose-Capillary: 127 mg/dL — ABNORMAL HIGH (ref 70–99)
Glucose-Capillary: 76 mg/dL (ref 70–99)
Glucose-Capillary: 151 mg/dL — ABNORMAL HIGH (ref 70–99)

## 2013-05-18 MED ORDER — WHITE PETROLATUM GEL
Status: AC
  Filled 2013-05-18: qty 5

## 2013-05-18 MED ORDER — HYDROCERIN EX CREA
TOPICAL_CREAM | Freq: Two times a day (BID) | CUTANEOUS | Status: DC
  Administered 2013-05-18 – 2013-05-24 (×13): via TOPICAL
  Filled 2013-05-18: qty 113

## 2013-05-18 NOTE — Progress Notes (Signed)
Subjective/Complaints: Pt in better spirits today. Best day in therapy thus far today, phantom pain improved last night.  No bowel or bladder issues Concerned about Left foot skin areas A 12 point review of systems has been performed and if not noted above is otherwise negative.   Objective: Vital Signs: Blood pressure 125/75, pulse 90, temperature 98 F (36.7 C), temperature source Oral, resp. rate 19, weight 90.311 kg (199 lb 1.6 oz), SpO2 97.00%. No results found. No results found for this basename: WBC, HGB, HCT, PLT,  in the last 72 hours No results found for this basename: NA, K, CL, CO, GLUCOSE, BUN, CREATININE, CALCIUM,  in the last 72 hours CBG (last 3)   Recent Labs  05/17/13 1116 05/17/13 1621 05/17/13 2111  GLUCAP 145* 116* 134*    Wt Readings from Last 3 Encounters:  05/15/13 90.311 kg (199 lb 1.6 oz)  05/09/13 90.8 kg (200 lb 2.8 oz)  05/09/13 90.8 kg (200 lb 2.8 oz)    Physical Exam:  Constitutional: She is oriented to person, place, and time.  HENT:  Head: Normocephalic and atraumatic.  Right Ear: External ear normal.  Left Ear: External ear normal.  Eyes: EOM are normal.  Neck: Normal range of motion. Neck supple. No JVD present. No tracheal deviation present. No thyromegaly present.  Cardiovascular: Normal rate and regular rhythm. No murmurs or rubs  Respiratory: Effort normal and breath sounds normal. No respiratory distress.  GI: Soft. Bowel sounds are normal. She exhibits no distension.  Musculoskeletal:  Right leg in post-op, coban dressing. Lymphadenopathy:  She has no cervical adenopathy.  Neurological: She is alert and oriented to person, place, and time. She displays normal reflexes. No cranial nerve deficit. Coordination normal. Can move right leg with assistance  Able to sense LT to Left foot Skin:  Postoperative dressing to BKA site. No visible drainage. Remaining skin intact Callus Left 1st MTP and heel, no breakdown Psychiatric: she  remains a little anxious, but in much better spirits. Non-tearful.      Assessment/Plan: 1. Functional deficits secondary to right BKA which require 3+ hours per day of interdisciplinary therapy in a comprehensive inpatient rehab setting. Physiatrist is providing close team supervision and 24 hour management of active medical problems listed below. Physiatrist and rehab team continue to assess barriers to discharge/monitor patient progress toward functional and medical goals.  FIM: FIM - Bathing Bathing Steps Patient Completed: Chest;Right Arm;Left Arm;Abdomen;Front perineal area;Buttocks;Right upper leg;Left upper leg;Left lower leg (including foot) Bathing: 5: Supervision: Safety issues/verbal cues  FIM - Upper Body Dressing/Undressing Upper body dressing/undressing steps patient completed: Thread/unthread right sleeve of pullover shirt/dresss;Thread/unthread left sleeve of pullover shirt/dress;Put head through opening of pull over shirt/dress;Pull shirt over trunk Upper body dressing/undressing: 5: Supervision: Safety issues/verbal cues FIM - Lower Body Dressing/Undressing Lower body dressing/undressing steps patient completed: Thread/unthread right underwear leg;Thread/unthread left underwear leg;Thread/unthread right pants leg;Thread/unthread left pants leg Lower body dressing/undressing: 3: Mod-Patient completed 50-74% of tasks  FIM - Toileting Toileting steps completed by patient: Adjust clothing prior to toileting;Performs perineal hygiene;Adjust clothing after toileting Toileting Assistive Devices: Grab bar or rail for support Toileting: 4: Steadying assist  FIM - Diplomatic Services operational officer Devices: Grab bars;Walker Toilet Transfers: 4-To toilet/BSC: Min A (steadying Pt. > 75%)  FIM - Bed/Chair Transfer Bed/Chair Transfer Assistive Devices: Arm rests Bed/Chair Transfer: 4: Bed > Chair or W/C: Min A (steadying Pt. > 75%);4: Chair or W/C > Bed: Min A (steadying  Pt. > 75%)  FIM -  Locomotion: Wheelchair Locomotion: Wheelchair: 5: Travels 150 ft or more: maneuvers on rugs and over door sills with supervision, cueing or coaxing FIM - Locomotion: Ambulation Ambulation/Gait Assistance: 4: Min assist Locomotion: Ambulation: 1: Travels less than 50 ft with minimal assistance (Pt.>75%)  Comprehension Comprehension Mode: Auditory Comprehension: 6-Follows complex conversation/direction: With extra time/assistive device  Expression Expression Mode: Verbal Expression: 6-Expresses complex ideas: With extra time/assistive device  Social Interaction Social Interaction: 5-Interacts appropriately 90% of the time - Needs monitoring or encouragement for participation or interaction.  Problem Solving Problem Solving: 5-Solves complex 90% of the time/cues < 10% of the time  Memory Memory: 5-Recognizes or recalls 90% of the time/requires cueing < 10% of the time  Medical Problem List and Plan:  1.Right BKA  2. DVT Prophylaxis/Anticoagulation: Subcutaneous heparin. Monitor platelet counts and any signs of bleeding  3. Pain Management:tramadol 50 mg every 6 as needed moderate pain.  Hydrocodone1 tab every 6 as needed as needed. Will schedule hydrocodone in am and at bed time each day.  -added pamelor 25mg  qhs for phantom limb pain. May help mood as well. 4. Mood: Valium 2 mg every 6 as needed anxiety. . Multiple social issues noted which affect her mood.  -continue 1:1 observation today. i don't believe she is a threat to harm self while here in hospital  -in much better spirits today  -appreciate psych eval. i am not sure what exactly his recommendation is however. She will definitely need outpt follow-up vs hospitalization.   5. Neuropsych: This patient is capable of making decisions on her own behalf.  6.Diabetes mellitus with peripheral neuropathy.Hemoglobin A1c 10.8.NovoLog 5 units 3 times a day, Lantus insulin 40 units twice a day. Check blood sugars a.c.  And at bedtime.  -sugars normalizing---follow for pattern 7.Hypertension.: Coreg 3.125 mg twice a day,lisinopril 10 mg daily.--hold lisinopril given hypotension  8.Tobacco abuse. Counseling  9.  Skin- add eucerin Left foot BID LOS (Days) 5 A FACE TO FACE EVALUATION WAS PERFORMED  Erick Colace 05/18/2013 6:56 AM

## 2013-05-18 NOTE — Progress Notes (Signed)
Occupational Therapy Session Note  Patient Details  Name: [name] [name] MRN: [id number] Date of Birth: 02-23-1956  Today's Date: 05/18/2013 Time: 4098-1191 Time Calculation (min): 25 min  Short Term Goals: Week 1:  OT Short Term Goal 1 (Week 1): Patient will complete tub transfer to standard tub with tub bench using LRAD with Min A OT Short Term Goal 2 (Week 1): Paient will bathe and dress lower body, sitting and standing, prn, with Min A. OT Short Term Goal 3 (Week 1): Patient will complete simple meal prep using LRAD with supervision OT Short Term Goal 4 (Week 1): Patient will demonstrate ability to perform HEP for UE strengthening with supervision  Skilled Therapeutic Interventions/Progress Updates:    1:1 focus on completion of obstacle course at w/c level forwards and backwards and then being able to pick up items off the floor safely from w/c level. Pt continued to c/o fatigue. Requested to get some water and return to bed. Pt able to get cup of water at the RN station mod I and setup w/c for transfer back to bed with supervision.   Therapy Documentation Precautions:  Precautions Precautions: Fall Precaution Comments: No NWB orders for Rt. LE, per typical protocol pt NWB on Rt. LE - requested order from RN.  Restrictions Weight Bearing Restrictions: Yes RLE Weight Bearing: Non weight bearing Pain:  no c/o pain; but did c/o increased fatigue this pm.  See FIM for current functional status  Therapy/Group: Individual Therapy  Roney Mans [facility] 05/18/2013, 3:34 PM

## 2013-05-18 NOTE — Progress Notes (Signed)
Physical Therapy Session Note  Patient Details  Name: [name] [name] MRN: [id number] Date of Birth: 1956-01-24  Today's Date: 05/18/2013 Time: 1400-1443 Time Calculation (min): 43 min  Short Term Goals: Week 1:     Skilled Therapeutic Interventions/Progress Updates:  Pt was seen in the pm. Tx focused on strengthening. Pt propelled w/c x 2 for 75 feet with B UEs and L LE. In gym treatment focused on UE and LE strengthening including arm chair push ups, mini squats, and L ankle pumps with green thera band, 3 sets x 10 reps each.    Therapy Documentation Precautions:  Precautions Precautions: Fall Precaution Comments: No NWB orders for Rt. LE, per typical protocol pt NWB on Rt. LE - requested order from RN.  Restrictions Weight Bearing Restrictions: Yes RLE Weight Bearing: Non weight bearing General:   Pain: Pt c/o soreness R BKA.  See FIM for current functional status  Therapy/Group: Individual Therapy  Rayford Halsted 05/18/2013, 3:33 PM

## 2013-05-18 NOTE — Progress Notes (Signed)
Physical Therapy Session Note  Patient Details  Name: [name] [name] MRN: [id number] Date of Birth: 12/13/1955  Today's Date: 05/18/2013 Time: 1000-1058 Time Calculation (min): 58 min  Short Term Goals: Week 1:     Skilled Therapeutic Interventions/Progress Updates:  Pt was seen bedside in the am. Pt transferred supine to edge of bed with side rail and S. Pt transferred edge of bed to w/c with min A. Pt propelled w/c about 150 feet with B UEs and L LE. Pt transferred w/c to mat, mat to w/c with min A and verbal cues. Pt transferred edge of mat to supine, supine to edge of mat with S to min A. While in supine patient perform LE exercises, 3 sets x 10 reps each including hip flex, hip abd/add, R quad sets, SLRs, bridging, and SAQs. Pt propelled w/c back to room about 150 feet with B UEs and L LEs.   Therapy Documentation Precautions:  Precautions Precautions: Fall Precaution Comments: No NWB orders for Rt. LE, per typical protocol pt NWB on Rt. LE - requested order from RN.  Restrictions Weight Bearing Restrictions: Yes RLE Weight Bearing: Non weight bearing General:   Pain: No c/o pain.   See FIM for current functional status  Therapy/Group: Individual Therapy  Rayford Halsted 05/18/2013, 12:53 PM

## 2013-05-18 NOTE — Plan of Care (Signed)
Problem: RH SAFETY Goal: RH STG ADHERE TO SAFETY PRECAUTIONS W/ASSISTANCE/DEVICE STG Adhere to Safety Precautions With Supervision of caregiver.  Outcome: Not Progressing Suicidal precautions with sitter 24/7.

## 2013-05-18 NOTE — Progress Notes (Signed)
Occupational Therapy Session Note  Patient Details  Name: [name] [name] MRN: [id number] Date of Birth: Oct 05, 1955  Today's Date: 05/18/2013 Time: 0800-0900 Time Calculation (min): 60 min  Short Term Goals: Week 1:  OT Short Term Goal 1 (Week 1): Patient will complete tub transfer to standard tub with tub bench using LRAD with Min A OT Short Term Goal 2 (Week 1): Paient will bathe and dress lower body, sitting and standing, prn, with Min A. OT Short Term Goal 3 (Week 1): Patient will complete simple meal prep using LRAD with supervision OT Short Term Goal 4 (Week 1): Patient will demonstrate ability to perform HEP for UE strengthening with supervision  Skilled Therapeutic Interventions/Progress Updates:    1:1 self care retraining at sink level - pt's choice today. Reported she just showered yesterday. Focus on safety with stand pivot transfers, performing short distance functional ambulation with RW (simulating distance from her bathroom door at home to her toilet), sit to stands with proper hand placements, discussion about whether she wanted a BSC over the commode at home for bilateral arm rest to push up on, side stepping down the edge of mat simulating tight spaces, desensitization exercises to address pain and exercises to strengthen gluts, hamstrings and quads in prep for prosthesis.   Therapy Documentation Precautions:  Precautions Precautions: Fall Precaution Comments: No NWB orders for Rt. LE, per typical protocol pt NWB on Rt. LE - requested order from RN.  Restrictions Weight Bearing Restrictions: Yes RLE Weight Bearing: Non weight bearing Pain: Pain Assessment Pain Score: 3  Pain Type: Phantom pain Pain Location: Leg Pain Orientation: Right Pain Intervention(s): Medication (See eMAR)  RN aware and gave meds at beginning of session  See FIM for current functional status  Therapy/Group: Individual Therapy  Roney Mans [facility] 05/18/2013, 11:28 AM

## 2013-05-19 ENCOUNTER — Inpatient Hospital Stay (HOSPITAL_COMMUNITY): Payer: Medicare Other | Admitting: Occupational Therapy

## 2013-05-19 LAB — GLUCOSE, CAPILLARY
Glucose-Capillary: 150 mg/dL — ABNORMAL HIGH (ref 70–99)
Glucose-Capillary: 148 mg/dL — ABNORMAL HIGH (ref 70–99)
Glucose-Capillary: 186 mg/dL — ABNORMAL HIGH (ref 70–99)

## 2013-05-19 NOTE — Progress Notes (Signed)
Subjective/Complaints:  No bowel or bladder issues Discussed left foot care A 12 point review of systems has been performed and if not noted above is otherwise negative.   Objective: Vital Signs: Blood pressure 144/73, pulse 88, temperature 98.1 F (36.7 C), temperature source Oral, resp. rate 17, weight 90.311 kg (199 lb 1.6 oz), SpO2 96.00%. No results found. No results found for this basename: WBC, HGB, HCT, PLT,  in the last 72 hours No results found for this basename: NA, K, CL, CO, GLUCOSE, BUN, CREATININE, CALCIUM,  in the last 72 hours CBG (last 3)   Recent Labs  05/18/13 1123 05/18/13 1619 05/18/13 2052  GLUCAP 127* 76 151*    Wt Readings from Last 3 Encounters:  05/15/13 90.311 kg (199 lb 1.6 oz)  05/09/13 90.8 kg (200 lb 2.8 oz)  05/09/13 90.8 kg (200 lb 2.8 oz)    Physical Exam:  Constitutional: She is oriented to person, place, and time.  HENT:  Head: Normocephalic and atraumatic.  Right Ear: External ear normal.  Left Ear: External ear normal.  Eyes: EOM are normal.  Neck: Normal range of motion. Neck supple. No JVD present. No tracheal deviation present. No thyromegaly present.  Cardiovascular: Normal rate and regular rhythm. No murmurs or rubs  Respiratory: Effort normal and breath sounds normal. No respiratory distress.  GI: Soft. Bowel sounds are normal. She exhibits no distension.  Musculoskeletal:  Right leg in post-op, coban dressing. Lymphadenopathy:  She has no cervical adenopathy.  Neurological: She is alert and oriented to person, place, and time. She displays normal reflexes. No cranial nerve deficit. Coordination normal. Can move right leg with assistance  Able to sense LT to Left foot Skin:  Postoperative dressing to BKA site. No visible drainage. Remaining skin intact Callus Left 1st MTP and heel, no breakdown Psychiatric: not anxious, . Non-tearful.      Assessment/Plan: 1. Functional deficits secondary to right BKA which require  3+ hours per day of interdisciplinary therapy in a comprehensive inpatient rehab setting. Physiatrist is providing close team supervision and 24 hour management of active medical problems listed below. Physiatrist and rehab team continue to assess barriers to discharge/monitor patient progress toward functional and medical goals.  FIM: FIM - Bathing Bathing Steps Patient Completed: Chest;Right Arm;Left Arm;Abdomen;Front perineal area;Buttocks;Right upper leg;Left upper leg;Left lower leg (including foot) Bathing: 5: Supervision: Safety issues/verbal cues  FIM - Upper Body Dressing/Undressing Upper body dressing/undressing steps patient completed: Thread/unthread right sleeve of pullover shirt/dresss;Thread/unthread left sleeve of pullover shirt/dress;Put head through opening of pull over shirt/dress;Pull shirt over trunk Upper body dressing/undressing: 5: Supervision: Safety issues/verbal cues FIM - Lower Body Dressing/Undressing Lower body dressing/undressing steps patient completed: Thread/unthread right underwear leg;Thread/unthread left underwear leg;Thread/unthread right pants leg;Thread/unthread left pants leg Lower body dressing/undressing: 3: Mod-Patient completed 50-74% of tasks  FIM - Toileting Toileting steps completed by patient: Adjust clothing prior to toileting;Performs perineal hygiene;Adjust clothing after toileting Toileting Assistive Devices: Grab bar or rail for support Toileting: 4: Steadying assist  FIM - Diplomatic Services operational officer Devices: Grab bars;Walker Toilet Transfers: 4-To toilet/BSC: Min A (steadying Pt. > 75%)  FIM - Bed/Chair Transfer Bed/Chair Transfer Assistive Devices: Arm rests Bed/Chair Transfer: 4: Bed > Chair or W/C: Min A (steadying Pt. > 75%);4: Chair or W/C > Bed: Min A (steadying Pt. > 75%)  FIM - Locomotion: Wheelchair Locomotion: Wheelchair: 5: Travels 150 ft or more: maneuvers on rugs and over door sills with supervision,  cueing or coaxing FIM - Locomotion: Ambulation  Ambulation/Gait Assistance: 4: Min assist Locomotion: Ambulation: 1: Travels less than 50 ft with minimal assistance (Pt.>75%)  Comprehension Comprehension Mode: Auditory Comprehension: 6-Follows complex conversation/direction: With extra time/assistive device  Expression Expression Mode: Verbal Expression: 6-Expresses complex ideas: With extra time/assistive device  Social Interaction Social Interaction: 5-Interacts appropriately 90% of the time - Needs monitoring or encouragement for participation or interaction.  Problem Solving Problem Solving: 6-Solves complex problems: With extra time  Memory Memory: 5-Recognizes or recalls 90% of the time/requires cueing < 10% of the time  Medical Problem List and Plan:  1.Right BKA  2. DVT Prophylaxis/Anticoagulation: Subcutaneous heparin. Monitor platelet counts and any signs of bleeding  3. Pain Management:tramadol 50 mg every 6 as needed moderate pain.  Hydrocodone1 tab every 6 as needed as needed. Will schedule hydrocodone in am and at bed time each day.  -added pamelor 25mg  qhs for phantom limb pain. May help mood as well. 4. Mood: Valium 2 mg every 6 as needed anxiety. . Multiple social issues noted which affect her mood.  -continue 1:1 observation today. i don't believe she is a threat to harm self while here in hospital  -in much better spirits today, slept well for 2 nites with nortriptyline  -appreciate psych eval. i am not sure what exactly his recommendation is however. She will definitely need outpt follow-up vs hospitalization.   5. Neuropsych: This patient is capable of making decisions on her own behalf.  6.Diabetes mellitus with peripheral neuropathy.Hemoglobin A1c 10.8.NovoLog 5 units 3 times a day, Lantus insulin 40 units twice a day. Check blood sugars a.c. And at bedtime.  -sugars normalizing---follow for pattern 7.Hypertension.: Coreg 3.125 mg twice a day,lisinopril 10 mg  daily.--hold lisinopril given hypotension  8.Tobacco abuse. Counseling  9.  Skin- add eucerin Left foot BID, monitor skin LOS (Days) 6 A FACE TO FACE EVALUATION WAS PERFORMED  [name] [name] 05/19/2013 7:02 AM

## 2013-05-19 NOTE — Progress Notes (Signed)
Occupational Therapy Session Note  Patient Details  Name: [name] [name] MRN: [id number] Date of Birth: September 26, 1955  Today's Date: 05/19/2013 Time: 1000-1045 Time Calculation (min): 45 min  Short Term Goals: Week 1:  OT Short Term Goal 1 (Week 1): Patient will complete tub transfer to standard tub with tub bench using LRAD with Min A OT Short Term Goal 2 (Week 1): Paient will bathe and dress lower body, sitting and standing, prn, with Min A. OT Short Term Goal 3 (Week 1): Patient will complete simple meal prep using LRAD with supervision OT Short Term Goal 4 (Week 1): Patient will demonstrate ability to perform HEP for UE strengthening with supervision  Skilled Therapeutic Interventions/Progress Updates:  Patient sitting EOB with sitter in room upon arrival.  Patient engaged in self care retraining to include shower, dress and groom.  Focused session on safe transfers bed>w/c><shower chair and with sit><stand.  Patient reluctant to perform tasks in standing stating, "I'm chicken".  With encouragement, patient able to stand to pull up her underware and shorts at sink for stability yet declined to perform any groom tasks standing at sink.  Patient reluctantly took a short walk with RW and close supervision and asked that the w/c be pushed behind her.  Patient reports feeling a little light headed and requested to sit down.  Patient seated in w/c with all items in reach and sitter in her room.  Therapy Documentation Precautions:  Precautions Precautions: Fall Precaution Comments: No NWB orders for Rt. LE, per typical protocol pt NWB on Rt. LE - requested order from RN.  Restrictions Weight Bearing Restrictions: Yes RLE Weight Bearing: Non weight bearing Pain: Denies pain ADL: See FIM for current functional status  Therapy/Group: Individual Therapy  Hulda Reddix 05/19/2013, 11:19 AM

## 2013-05-19 NOTE — Plan of Care (Signed)
Problem: RH SAFETY Goal: RH STG ADHERE TO SAFETY PRECAUTIONS W/ASSISTANCE/DEVICE STG Adhere to Safety Precautions With Supervision of caregiver.  Outcome: Not Progressing Currently requires 1:1 sitter for suicide precautions

## 2013-05-19 NOTE — Plan of Care (Signed)
Problem: RH SKIN INTEGRITY Goal: RH STG ABLE TO PERFORM INCISION/WOUND CARE W/ASSISTANCE STG Able To Perform Incision/Wound Care With Min Assistance of caregiver  Outcome: Not Progressing Surgical dressing in place; no opportunities thus far to perform incision care.

## 2013-05-20 ENCOUNTER — Inpatient Hospital Stay (HOSPITAL_COMMUNITY): Payer: Medicare Other | Admitting: *Deleted

## 2013-05-20 ENCOUNTER — Inpatient Hospital Stay (HOSPITAL_COMMUNITY): Payer: Medicare Other

## 2013-05-20 ENCOUNTER — Inpatient Hospital Stay (HOSPITAL_COMMUNITY): Payer: Self-pay | Admitting: Physical Therapy

## 2013-05-20 DIAGNOSIS — L98499 Non-pressure chronic ulcer of skin of other sites with unspecified severity: Secondary | ICD-10-CM

## 2013-05-20 DIAGNOSIS — G547 Phantom limb syndrome without pain: Secondary | ICD-10-CM

## 2013-05-20 DIAGNOSIS — S88119A Complete traumatic amputation at level between knee and ankle, unspecified lower leg, initial encounter: Secondary | ICD-10-CM

## 2013-05-20 DIAGNOSIS — D62 Acute posthemorrhagic anemia: Secondary | ICD-10-CM

## 2013-05-20 DIAGNOSIS — I739 Peripheral vascular disease, unspecified: Secondary | ICD-10-CM

## 2013-05-20 LAB — GLUCOSE, CAPILLARY
Glucose-Capillary: 109 mg/dL — ABNORMAL HIGH (ref 70–99)
Glucose-Capillary: 155 mg/dL — ABNORMAL HIGH (ref 70–99)

## 2013-05-20 NOTE — Progress Notes (Addendum)
Subjective/Complaints: Feels better today.  Only 5 short sharp shooting pains yesterday "I bottomed out a few days ago, better now"   A 12 point review of systems has been performed and if not noted above is otherwise negative.   Objective: Vital Signs: Blood pressure 108/67, pulse 90, temperature 98.1 F (36.7 C), temperature source Oral, resp. rate 16, weight 90.311 kg (199 lb 1.6 oz), SpO2 99.00%. No results found. No results found for this basename: WBC, HGB, HCT, PLT,  in the last 72 hours No results found for this basename: NA, K, CL, CO, GLUCOSE, BUN, CREATININE, CALCIUM,  in the last 72 hours CBG (last 3)   Recent Labs  05/19/13 1123 05/19/13 1627 05/19/13 2042  GLUCAP 148* 101* 186*    Wt Readings from Last 3 Encounters:  05/15/13 90.311 kg (199 lb 1.6 oz)  05/09/13 90.8 kg (200 lb 2.8 oz)  05/09/13 90.8 kg (200 lb 2.8 oz)    Physical Exam:  Constitutional: She is oriented to person, place, and time.  HENT:  Head: Normocephalic and atraumatic.  Right Ear: External ear normal.  Left Ear: External ear normal.  Eyes: EOM are normal.  Neck: Normal range of motion. Neck supple. No JVD present. No tracheal deviation present. No thyromegaly present.  Cardiovascular: Normal rate and regular rhythm. No murmurs or rubs  Respiratory: Effort normal and breath sounds normal. No respiratory distress.  GI: Soft. Bowel sounds are normal. She exhibits no distension.  Musculoskeletal:  Right leg in post-op, coban dressing. Lymphadenopathy:  She has no cervical adenopathy.  Neurological: She is alert and oriented to person, place, and time. She displays normal reflexes. No cranial nerve deficit. Coordination normal. Can move right leg with assistance  Able to sense LT to Left foot Skin:  Postoperative dressing to BKA site. No visible drainage. Remaining skin intact Callus Left 1st MTP and heel, no breakdown Psychiatric: not anxious, . Non-tearful.       Assessment/Plan: 1. Functional deficits secondary to right BKA which require 3+ hours per day of interdisciplinary therapy in a comprehensive inpatient rehab setting. Physiatrist is providing close team supervision and 24 hour management of active medical problems listed below. Physiatrist and rehab team continue to assess barriers to discharge/monitor patient progress toward functional and medical goals.  FIM: FIM - Bathing Bathing Steps Patient Completed: Chest;Right Arm;Left Arm;Abdomen;Front perineal area;Buttocks;Right upper leg;Left upper leg;Left lower leg (including foot) Bathing: 5: Supervision: Safety issues/verbal cues (lateral leans)  FIM - Upper Body Dressing/Undressing Upper body dressing/undressing steps patient completed: Thread/unthread right sleeve of pullover shirt/dresss;Thread/unthread left sleeve of pullover shirt/dress;Put head through opening of pull over shirt/dress;Pull shirt over trunk Upper body dressing/undressing: 5: Supervision: Safety issues/verbal cues FIM - Lower Body Dressing/Undressing Lower body dressing/undressing steps patient completed: Thread/unthread right underwear leg;Thread/unthread left underwear leg;Thread/unthread right pants leg;Thread/unthread left pants leg;Pull underwear up/down;Pull pants up/down;Don/Doff left sock Lower body dressing/undressing: 4: Min-Patient completed 75 plus % of tasks  FIM - Toileting Toileting steps completed by patient: Adjust clothing prior to toileting;Performs perineal hygiene;Adjust clothing after toileting Toileting Assistive Devices: Grab bar or rail for support Toileting: 4: Steadying assist  FIM - Diplomatic Services operational officer Devices: Bedside commode Toilet Transfers: 4-To toilet/BSC: Min A (steadying Pt. > 75%)  FIM - Bed/Chair Transfer Bed/Chair Transfer Assistive Devices: Arm rests Bed/Chair Transfer: 4: Bed > Chair or W/C: Min A (steadying Pt. > 75%);4: Chair or W/C > Bed: Min A  (steadying Pt. > 75%)  FIM - Locomotion: Wheelchair Locomotion: Wheelchair:  5: Travels 150 ft or more: maneuvers on rugs and over door sills with supervision, cueing or coaxing FIM - Locomotion: Ambulation Ambulation/Gait Assistance: 4: Min assist Locomotion: Ambulation: 1: Travels less than 50 ft with minimal assistance (Pt.>75%)  Comprehension Comprehension Mode: Auditory Comprehension: 6-Follows complex conversation/direction: With extra time/assistive device  Expression Expression Mode: Verbal Expression: 6-Expresses complex ideas: With extra time/assistive device  Social Interaction Social Interaction: 5-Interacts appropriately 90% of the time - Needs monitoring or encouragement for participation or interaction.  Problem Solving Problem Solving: 6-Solves complex problems: With extra time  Memory Memory: 5-Recognizes or recalls 90% of the time/requires cueing < 10% of the time  Medical Problem List and Plan:  1.Right BKA  2. DVT Prophylaxis/Anticoagulation: Subcutaneous heparin. Monitor platelet counts and any signs of bleeding  3. Pain Management:tramadol 50 mg every 6 as needed moderate pain.  Hydrocodone1 tab every 6 as needed as needed. Will schedule hydrocodone in am and at bed time each day.  -added pamelor 25mg  qhs for phantom limb pain. May help mood as well. 4. Mood: Valium 2 mg every 6 as needed anxiety. . Multiple social issues noted which affect her mood.  -continue 1:1 observation today. i don't believe she is a threat to harm self while here in hospital  -in much better spirits today, slept well for 2 nites with nortriptyline  -appreciate psych eval. Request f/u to see if sitter can be D/Ced  5. Neuropsych: This patient is capable of making decisions on her own behalf.  6.Diabetes mellitus with peripheral neuropathy.Hemoglobin A1c 10.8.NovoLog 5 units 3 times a day, Lantus insulin 40 units twice a day. Check blood sugars a.c. And at bedtime.  -sugars  normalizing---follow for pattern 7.Hypertension.: Coreg 3.125 mg twice a day,lisinopril 10 mg daily.--hold lisinopril given hypotension  8.Tobacco abuse. Counseling  9.  Skin- add eucerin Left foot BID, monitor skin LOS (Days) 7 A FACE TO FACE EVALUATION WAS PERFORMED  KIRSTEINS,ANDREW E 05/20/2013 6:11 AM

## 2013-05-20 NOTE — Plan of Care (Signed)
Problem: RH SKIN INTEGRITY Goal: RH STG ABLE TO PERFORM INCISION/WOUND CARE W/ASSISTANCE STG Able To Perform Incision/Wound Care With Min Assistance of caregiver  Outcome: Not Progressing Pt has surgical dressing intact.

## 2013-05-20 NOTE — Progress Notes (Signed)
Physical Therapy Session Note  Patient Details  Name: [name] [name] MRN: [id number] Date of Birth: [birth year]  Today's Date: 05/20/2013 Time: 4098-1191 Time Calculation (min): 41 min  Skilled Therapeutic Interventions/Progress Updates:    Patient received sitting in wheelchair. Session focused on transfers to high/low surfaces, gait training, and car transfers; see details below. Patient performed stand pivot transfer wheelchair<>raised treatment mat with RW and supervision. In family room, patient performed squat pivot transfer wheelchair>low, cushioned couch with close supervision. Patient requires maxA for squat pivot transfer couch>wheelchair (second person to stabilize wheelchair). Patient performed stand pivot transfer wheelchair<>car with RW and supervision. Car heigh raised to height of midsize SUV secondary to reports from patient that she has a Research scientist (physical sciences). Patient returned to room and performed x10 wheelchair pushups, patient demonstrating good elevation and glutes clearance. Patient left sitting in wheelchair with all needs within reach.  Therapy Documentation Precautions:  Precautions Precautions: Fall Restrictions Weight Bearing Restrictions: Yes RLE Weight Bearing: Non weight bearing Pain: Pain Assessment Pain Assessment: No/denies pain Pain Score: 0-No pain Locomotion : Ambulation Ambulation: Yes Ambulation/Gait Assistance: 4: Min assist Ambulation Distance (Feet): 13 Feet Assistive device: Rolling walker Ambulation/Gait Assistance Details: Verbal cues for gait pattern;Verbal cues for safe use of DME/AE Ambulation/Gait Assistance Details: Patient performed gait training 13' x1 in controlled environment. Verbal cues for use of B UE to lift rather than hopping. Gait Gait: Yes Gait Pattern: Impaired Gait Pattern: Step-to pattern;Decreased stride length;Trunk flexed Wheelchair Mobility Wheelchair Mobility: Yes Wheelchair Assistance: 5:  Supervision Wheelchair Propulsion: Both upper extremities;Left lower extremity Wheelchair Parts Management: Independent Distance: >200 x3  See FIM for current functional status  Therapy/Group: Individual Therapy  Chipper Herb. Maks Cavallero, PT, DPT 05/20/2013, 3:57 PM

## 2013-05-20 NOTE — Progress Notes (Addendum)
Physical Therapy Session Note  Patient Details  Name: [name] [name] MRN: [id number] Date of Birth: 12/30/1955  Today's Date: 05/20/2013 Time: 4098-1191 Time Calculation (min): 55 min  Skilled Therapeutic Interventions/Progress Updates:    Initially focused on wheelchair level mobility in the home/kitchen and transfers wheelchair <> low couch, bed. Mod assist from low couch and supervision to/from bed. Cues for wheelchair parts management. Wheelchair propulsion ascending/descending ramp x 2 reps with supervision cues for weightshifting. Initial floor transfer prep lowering to step and back to wheelchair, full floor transfer (anterior/posterior) utilizing step performed with supervision to floor, min/mod assist back to chair. Rotator cuff strengthening with orange theraband assistance 2 x 10 reps. Educated on importance of maintaining healthy rotator cuff.   Pt reports and demonstrates compliance with pressure relief in wheelchair.   Second Session Time:  1430-1500 Time Calculation (min): 30 min Skilled Therapeutic Interventions/Progress Updates:  Co-treat with rec therapy focusing on kitchen environment management and baking task (muffins) at wheelchair level with exception of one sit <> stand and management of RW (close supervision) to reach items in high level cabinet. Discussed need for supervision when standing, pt reports daughter may be staying with her during the day for "a while." Pt appropriately verbalized safety awareness with oven and remembering to turn oven off Independently.   Therapy Documentation Precautions:  Precautions Precautions: Fall Precaution Comments: No NWB orders for Rt. LE, per typical protocol pt NWB on Rt. LE - requested order from RN.  Restrictions Weight Bearing Restrictions: Yes RLE Weight Bearing: Non weight bearing Pain: Pain Assessment Pain Assessment: does not rate Pain Type: Phantom pain Pain Location: Leg Pain Orientation: Right Pain  Descriptors / Indicators: Aching;Sharp Pain Onset:  (intermittent) Pain Intervention(s): Repositioned (reviewed pain management interventions)   See FIM for current functional status  Therapy/Group: Individual Therapy 1st treatment, co-treat 2nd treatment.   Sherrine Maples Cheek 05/20/2013, 10:43 AM

## 2013-05-20 NOTE — Progress Notes (Signed)
Occupational Therapy Session Note  Patient Details  Name: [name] [name] MRN: [id number] Date of Birth: 1955-08-15  Today's Date: 05/20/2013 Time: 0730-0828 Time Calculation (min): 58 min  Short Term Goals: Week 1:  OT Short Term Goal 1 (Week 1): Patient will complete tub transfer to standard tub with tub bench using LRAD with Min A OT Short Term Goal 2 (Week 1): Paient will bathe and dress lower body, sitting and standing, prn, with Min A. OT Short Term Goal 3 (Week 1): Patient will complete simple meal prep using LRAD with supervision OT Short Term Goal 4 (Week 1): Patient will demonstrate ability to perform HEP for UE strengthening with supervision  Skilled Therapeutic Interventions: ADL-retraining with emphasis on improved functional mobility and endurance using RW during ADL, improved dynamic standing balance, and improved affect.   Pt ambulated from bed to shower, completed transfer and bathing (seated), and ambulated from bathroom to w/c to perform dressing, sitting at standing at sink, supporting herself with bil UE to pull up her underwear and pants.   Patient noted with improved affect throughout session, reporting recent dreams and new plans for continued recovery at her home.   Patient required only setup assist to manage equipment and supplies and supervision for safety during this session.       Therapy Documentation Precautions:  Precautions Precautions: Fall Precaution Comments: No NWB orders for Rt. LE, per typical protocol pt NWB on Rt. LE - requested order from RN.  Restrictions Weight Bearing Restrictions: No RLE Weight Bearing: Non weight bearing  Vital Signs: Therapy Vitals Temp: 98.1 F (36.7 C) Temp src: Oral Pulse Rate: 98 Resp: 16 BP: 158/73 mmHg Patient Position, if appropriate: Sitting Oxygen Therapy SpO2: 99 % O2 Device: None (Room air)  Pain: Pain Assessment Pain Assessment: No/denies pain  See FIM for current functional  status  Therapy/Group: Individual Therapy  [name] [name] 05/20/2013, 8:29 AM

## 2013-05-20 NOTE — Consult Note (Signed)
Neuropsychology Psychosocial Evaluation - [facility] _____________________________________________________________________________  Per request from the treatment team and recommendation from prior neuropsychological report, I met with Mrs. Brileigh Holzschuh for a session to provide supportive therapy and to re-assess mood and suicidal ideation.   Mrs. Hutt's mood today seemed significantly elevated.  The tone of her voice was chipper and she happily shared about friends who came to visit last night and flowers that were sent by her brother.  She also said that her friends told her that her husband and daughter were working hard to prepare her home (e.g. fixing a ramp, etc.).  Mrs. Pinedo stated that she was pleasantly surprised by the outpouring of support that she received from friends and family since our visit yesterday.  She also mentioned plans that she has after returning home (e.g. what she will do after she is able to obtain a prosthetic leg, etc.).  Mrs. Rotan agreed that her mood was markedly improved from the day before and denied having suicidal intent anymore.  She credited her improvement in mood and reduced suicidal ideation to the gestures from family and friends and from being able to talk about her suicidal thoughts.  She mentioned that this was the first time in her life that she had ever said out loud that she wanted to harm herself and that it had been therapeutic to discuss it.  Mrs. Stambaugh biggest concern during this visit was reported to be inability to sleep well because of phantom limb pain and pain at the site of her amputation.    Mrs. Sandner reported feeling encouraged by her improvement in mood and agreed that she would like to stay safe when returning home.  It is particularly encouraging that she is making plans that take significant time (e.g. what she will do after getting a prosthetic leg someday, etc.).  We spent time discussing  risks for increased depression upon returning home and agreed to meet again next week to develop a specific safety plan.  Also, Mrs. Taborn requested that I call her husband to inform him about her suicidal ideation so that he is "in-the-loop" about what she has been going through and how severe her emotional state has been.    Per her specific request, I called her husband at work and informed him about her suicidal ideation, including how intense it was yesterday, as well as the noted improvement today.  He said that he was unaware that she was having so much trouble coping and he said he would plan to come for a visit after work this evening to discuss it with her.    As mentioned above, I will plan to meet with Mrs. Burgoon once more before her discharge next week to evaluate her mood and assess her level of suicidal ideation.  We will also create a specific safety plan during that meeting.    DIAGNOSIS: Right below knee amputation Depressive disorder, NOS  Greater than 50% of this visit was spent educating the patient about the possible diagnosis, prognosis, management plan, and in coordination of care.   Leavy Cella, PsyD Clinical Neuropsychologist
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
## 2013-05-20 NOTE — Plan of Care (Signed)
Problem: RH SAFETY Goal: RH STG ADHERE TO SAFETY PRECAUTIONS W/ASSISTANCE/DEVICE STG Adhere to Safety Precautions With Supervision of caregiver.  Outcome: Not Progressing Pt continues to have suicide precautions and sitter inplace

## 2013-05-20 NOTE — Progress Notes (Signed)
Complains of "shooting" pain to right BKA, but only happens a "few" times a day. Surgical dressing still in place-CD & I. Patient reports feeling better and getting stronger. "I hit rock bottom a few days ago". Sitter at bedside. [name] [name] A

## 2013-05-20 NOTE — Progress Notes (Signed)
Recreational Therapy Session Note  Patient Details  Name: [name] [name] MRN: [id number] Date of Birth: [birth year] Today's Date: 05/20/2013 Time: 1435- 1515 Pain: no c/o Skilled Therapeutic Interventions/Progress Updates: Session focused on activity tolerance, functional mobility, problem solving through simple meal prep in kitchen & sit-stands & standing balance with supervision both seated & standing.  Pt demonstrated & verbalized good safety awareness through session.  Pt stated that this activity made her feel good. Therapy/Group: Co-Treatment   Jakell Trusty 05/20/2013, 4:07 PM

## 2013-05-21 ENCOUNTER — Ambulatory Visit (HOSPITAL_COMMUNITY): Payer: Self-pay | Admitting: *Deleted

## 2013-05-21 ENCOUNTER — Inpatient Hospital Stay (HOSPITAL_COMMUNITY): Payer: Medicare Other

## 2013-05-21 ENCOUNTER — Inpatient Hospital Stay (HOSPITAL_COMMUNITY): Payer: Self-pay | Admitting: Physical Therapy

## 2013-05-21 ENCOUNTER — Inpatient Hospital Stay (HOSPITAL_COMMUNITY): Payer: Medicare Other | Admitting: *Deleted

## 2013-05-21 LAB — GLUCOSE, CAPILLARY: Glucose-Capillary: 148 mg/dL — ABNORMAL HIGH (ref 70–99)

## 2013-05-21 MED ORDER — NORTRIPTYLINE HCL 25 MG PO CAPS
50.0000 mg | ORAL_CAPSULE | Freq: Every day | ORAL | Status: DC
  Administered 2013-05-21 – 2013-05-23 (×3): 50 mg via ORAL
  Filled 2013-05-21 (×4): qty 2

## 2013-05-21 NOTE — Progress Notes (Signed)
Occupational Therapy Weekly Progress Note  Patient Details  Name: [name] [name] MRN: [id number] Date of Birth: Jan 21, 1956  Today's Date: 05/21/2013  Patient has met 3 of 4 short term goals.  Patient progressing toward LTG of modified independence with BADL and simple meal prep.  Patient continues to demonstrate the following deficits: Generalized weakness, emotional lability, and impaired dynamic standing balance and therefore will continue to benefit from skilled OT intervention to enhance overall performance with BADL and iADL.  Patient progressing toward long term goals..  Continue plan of care.  OT Short Term Goals Week 1:  OT Short Term Goal 1 (Week 1): Patient will complete tub transfer to standard tub with tub bench using LRAD with Min A OT Short Term Goal 1 - Progress (Week 1): Met OT Short Term Goal 2 (Week 1): Paient will bathe and dress lower body, sitting and standing, prn, with Min A. OT Short Term Goal 2 - Progress (Week 1): Met OT Short Term Goal 3 (Week 1): Patient will complete simple meal prep using LRAD with supervision OT Short Term Goal 3 - Progress (Week 1): Progressing toward goal OT Short Term Goal 4 (Week 1): Patient will demonstrate ability to perform HEP for UE strengthening with supervision OT Short Term Goal 4 - Progress (Week 1): Met Week 2:  OT Short Term Goal 1 (Week 2): Patient will complete simple meal prep using LRAD with supervision OT Short Term Goal 2 (Week 2): Patient will demonstrate ability to complete HEP for UE strengthening, independently OT Short Term Goal 3 (Week 2): Patient will perform bathing and dressing, sitting and standing, using LRAD with supervision OT Short Term Goal 4 (Week 2): Patient will verbalize understanding of home safety recommendation    Therapy Documentation Precautions:  Precautions Precautions: Fall Precaution Comments: No NWB orders for Rt. LE, per typical protocol pt NWB on Rt. LE - requested order from RN.   Restrictions Weight Bearing Restrictions: Yes RLE Weight Bearing: Non weight bearing  Vital Signs: Therapy Vitals Temp: 98.5 F (36.9 C) Temp src: Oral Pulse Rate: 99 Resp: 17 BP: 115/72 mmHg Patient Position, if appropriate: Sitting Oxygen Therapy SpO2: 100 % O2 Device: None (Room air)  Pain: Pain Assessment Pain Assessment: No/denies pain Pain Score: 0-No pain  See FIM for current functional status  [name] [name] 05/21/2013, 4:11 PM

## 2013-05-21 NOTE — Progress Notes (Signed)
Physical Therapy Session Note  Patient Details  Name: [name] [name] MRN: [id number] Date of Birth: 1955/11/02  Today's Date: 05/21/2013 Time: 4098-1191 Time Calculation (min): 43 min  Skilled Therapeutic Interventions/Progress Updates:    Patient received sitting in wheelchair. Session focused on wheelchair mobility, standing dynamic balance, and discharge planning/home management. Patient propelled wheelchair with B UEs and L LE and supervision >150'. Patient states that she will be able to fit RW into bathroom. Emphasized to patient that she is not to hop into bathroom at home unless someone is with her and that she is to use the [facility] otherwise. Patient verbalized understanding and in agreement. Patient performed standing dynamic balance activity: standing with RW played one game of ConnectFour (lasting approximately 4-5 min), with one UE on RW and other UE used to manipulate pieces of game. Patient requires min guard for activity and is without overt LOB during activity.   Upon returning to sitting, patient becomes tearful, stating that she is worried her husband and daughter are going to throw away all her stuff. Emotional support provided and discussion about discharge planning and needs required for safe discharge home. Patient returned to RN station with nurse tech.  Therapy Documentation Precautions:  Precautions Precautions: Fall Precaution Comments: No NWB orders for Rt. LE, per typical protocol pt NWB on Rt. LE - requested order from RN.  Restrictions Weight Bearing Restrictions: Yes RLE Weight Bearing: Non weight bearing Pain: Pain Assessment Pain Assessment: No/denies pain Pain Score: 0-No pain Locomotion : Ambulation Ambulation/Gait Assistance: Not tested (comment) Wheelchair Mobility Distance: 150   See FIM for current functional status  Therapy/Group: Individual Therapy  Chipper Herb. Nobie Alleyne, PT, DPT 05/21/2013, 12:15 PM

## 2013-05-21 NOTE — Progress Notes (Signed)
Occupational Therapy Session Note  Patient Details  Name: [name] [name] MRN: [id number] Date of Birth: 1956/02/22  Today's Date: 05/21/2013 Time: 0730-0829 Time Calculation (min): 59 min  Short Term Goals: Week 1:  OT Short Term Goal 1 (Week 1): Patient will complete tub transfer to standard tub with tub bench using LRAD with Min A OT Short Term Goal 2 (Week 1): Paient will bathe and dress lower body, sitting and standing, prn, with Min A. OT Short Term Goal 3 (Week 1): Patient will complete simple meal prep using LRAD with supervision OT Short Term Goal 4 (Week 1): Patient will demonstrate ability to perform HEP for UE strengthening with supervision  Skilled Therapeutic Interventions: ADL-retraining with emphasis on functional transfers using standard RW, tub bench transfer to standard tub/shower, and improved dynamic standing balance.   Patient reported headache at beginning of session as limiting her tolerance to activity but she responded to redirection and proceeded through ADL with good attention and engagement throughout activity.   Patient ambulated 3' from doorway to tub bench using RW and completed transfer to tub bench with only supervision for safety.   Patient noted orientation of tub as "opposite" to her tub at home but she remained engaged in session and performed lateral leans to clean buttocks and peri-area while seated.   Patient used RW to stand to pull up her underwear and pants from edge of tub bench with only close supervision for safety and with no evidence of LOB noted during performance of dressing.   Patient returned to her room in w/c to complete seated grooming at sink.        Therapy Documentation Precautions:  Precautions Precautions: Fall Precaution Comments: No NWB orders for Rt. LE, per typical protocol pt NWB on Rt. LE - requested order from RN.  Restrictions Weight Bearing Restrictions: Yes RLE Weight Bearing: Non weight bearing  Vital Signs: Therapy  Vitals Temp: 98.5 F (36.9 C) Temp src: Oral Pulse Rate: 99 Resp: 17 BP: 115/72 mmHg Patient Position, if appropriate: Sitting Oxygen Therapy SpO2: 100 % O2 Device: None (Room air)  Pain: Pain Assessment Pain Assessment: No/denies pain Pain Score: 0-No pain  See FIM for current functional status  Therapy/Group: Individual Therapy  [name] [name] 05/21/2013, 3:47 PM

## 2013-05-21 NOTE — Progress Notes (Signed)
NUTRITION FOLLOW-UP  DOCUMENTATION CODES Per approved criteria  -Obesity Unspecified   INTERVENTION: Continue Glucerna Shake po prn, each supplement provides 220 kcal and 10 grams of protein. RD completed diet education regarding Diabetes and Meal Planning during this visit. RD to continue to follow nutrition care plan.  NUTRITION DIAGNOSIS: Increased nutrient needs r/t post-op healing AEB estimated needs. Ongoing.  Goal: Pt to meet >/= 90% of their estimated nutrition needs  Monitor:  Wt, po intake, labs, acceptance of supplements  ASSESSMENT: Pt with history of uncontrolled diabetes, depression that presents to the emergency department for foot ulcer. Patient states her she had previously he stepped on a piece of glass back in September 2014. She has been seeing wound care. 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  Per chart, pt making suicidal ideations on 12/11 - sitter added. Currently ordered for Carbohydrate Modified Medium diet, eating about 50% of meals. RD provided pt with 5 sample meal plans for when she goes home to help provide ideas of what she needs to follow to help with better blood sugar management.  Blood sugars ranging from 94-155.  Height: Ht Readings from Last 1 Encounters:  05/09/13 5\' 8"  (1.727 m)    Weight: Wt Readings from Last 1 Encounters:  05/15/13 199 lb 1.6 oz (90.311 kg)   BMI:  32.5 (using adjusted weight for BKA) - Obese Class I  Estimated Nutritional Needs: Kcal: 2250-2500 Protein: 110-125 g/day Fluid: 2.3-2.5 L/day  Skin: diabetic ulcer on right foot  Diet Order: Carb Control Medium (1600 - 2000)  EDUCATION NEEDS: -No education needs identified at this time   Intake/Output Summary (Last 24 hours) at 05/21/13 1016 Last data filed at 05/21/13 0730  Gross per 24 hour  Intake    600 ml  Output      0 ml  Net    600 ml    Last BM: 12/15  Labs:  No results found for this basename: NA, K, CL, CO2, BUN, CREATININE, CALCIUM, MG, PHOS, GLUCOSE,   in the last 168 hours  CBG (last 3)   Recent Labs  05/20/13 1631 05/20/13 2043 05/21/13 0720  GLUCAP 94 155* 140*    Scheduled Meds: . aspirin  324 mg Oral Daily  . carvedilol  3.125 mg Oral BID WC  . heparin  5,000 Units Subcutaneous Q8H  . hydrocerin   Topical BID  . insulin aspart  0-15 Units Subcutaneous TID WC  . insulin aspart  5 Units Subcutaneous TID WC  . insulin glargine  40 Units Subcutaneous BID  . nortriptyline  50 mg Oral QHS    Continuous Infusions:   Jarold Motto MS, RD, LDN Pager: [phone] After-hours pager: [phone]

## 2013-05-21 NOTE — Progress Notes (Signed)
Subjective/Complaints: "i have a headache." just took tylenol. pamelor had really helped sleep and phantom pain until last night. Mood is better  A 12 point review of systems has been performed and if not noted above is otherwise negative.   Objective: Vital Signs: Blood pressure 144/79, pulse 99, temperature 97.6 F (36.4 C), temperature source Oral, resp. rate 18, weight 90.311 kg (199 lb 1.6 oz), SpO2 98.00%. No results found. No results found for this basename: WBC, HGB, HCT, PLT,  in the last 72 hours No results found for this basename: NA, K, CL, CO, GLUCOSE, BUN, CREATININE, CALCIUM,  in the last 72 hours CBG (last 3)   Recent Labs  05/20/13 1631 05/20/13 2043 05/21/13 0720  GLUCAP 94 155* 140*    Wt Readings from Last 3 Encounters:  05/15/13 90.311 kg (199 lb 1.6 oz)  05/09/13 90.8 kg (200 lb 2.8 oz)  05/09/13 90.8 kg (200 lb 2.8 oz)    Physical Exam:  Constitutional: She is oriented to person, place, and time.  HENT:  Head: Normocephalic and atraumatic.  Right Ear: External ear normal.  Left Ear: External ear normal.  Eyes: EOM are normal.  Neck: Normal range of motion. Neck supple. No JVD present. No tracheal deviation present. No thyromegaly present.  Cardiovascular: Normal rate and regular rhythm. No murmurs or rubs  Respiratory: Effort normal and breath sounds normal. No respiratory distress.  GI: Soft. Bowel sounds are normal. She exhibits no distension.  Musculoskeletal:  Right leg in post-op, coban dressing. Lymphadenopathy:  She has no cervical adenopathy.  Neurological: She is alert and oriented to person, place, and time. She displays normal reflexes. No cranial nerve deficit. Coordination normal. Can move right leg with assistance  Able to sense LT to Left foot Skin:  Postoperative dressing to BKA site. No visible drainage. Remaining skin intact Callus Left 1st MTP and heel, no breakdown Psychiatric: not anxious, . Non-tearful.       Assessment/Plan: 1. Functional deficits secondary to right BKA which require 3+ hours per day of interdisciplinary therapy in a comprehensive inpatient rehab setting. Physiatrist is providing close team supervision and 24 hour management of active medical problems listed below. Physiatrist and rehab team continue to assess barriers to discharge/monitor patient progress toward functional and medical goals.  FIM: FIM - Bathing Bathing Steps Patient Completed: Chest;Right Arm;Left Arm;Abdomen;Front perineal area;Buttocks;Right upper leg;Left upper leg;Left lower leg (including foot) Bathing: 5: Supervision: Safety issues/verbal cues  FIM - Upper Body Dressing/Undressing Upper body dressing/undressing steps patient completed: Thread/unthread right sleeve of pullover shirt/dresss;Thread/unthread left sleeve of pullover shirt/dress;Put head through opening of pull over shirt/dress;Pull shirt over trunk Upper body dressing/undressing: 7: Complete Independence: No helper FIM - Lower Body Dressing/Undressing Lower body dressing/undressing steps patient completed: Thread/unthread left underwear leg;Thread/unthread right underwear leg;Pull underwear up/down;Thread/unthread right pants leg;Thread/unthread left pants leg;Pull pants up/down;Fasten/unfasten pants;Don/Doff left sock;Don/Doff left shoe;Fasten/unfasten left shoe Lower body dressing/undressing: 7: Complete Independence: No helper  FIM - Toileting Toileting steps completed by patient: Adjust clothing prior to toileting;Performs perineal hygiene;Adjust clothing after toileting Toileting Assistive Devices: Grab bar or rail for support Toileting: 4: Steadying assist  FIM - Diplomatic Services operational officer Devices: Bedside commode Toilet Transfers: 4-To toilet/BSC: Min A (steadying Pt. > 75%)  FIM - Bed/Chair Transfer Bed/Chair Transfer Assistive Devices: Arm rests;Walker Bed/Chair Transfer: 5: Chair or W/C > Bed: Supervision  (verbal cues/safety issues);5: Bed > Chair or W/C: Supervision (verbal cues/safety issues)  FIM - Locomotion: Wheelchair Distance: 200 Locomotion: Wheelchair: 5: [employer]  150 ft or more: maneuvers on rugs and over door sills with supervision, cueing or coaxing FIM - Locomotion: Ambulation Locomotion: Ambulation Assistive Devices: Walker - Rolling Ambulation/Gait Assistance: 4: Min assist Locomotion: Ambulation: 1: Travels less than 50 ft with minimal assistance (Pt.>75%)  Comprehension Comprehension Mode: Auditory Comprehension: 6-Follows complex conversation/direction: With extra time/assistive device  Expression Expression Mode: Verbal Expression: 6-Expresses complex ideas: With extra time/assistive device  Social Interaction Social Interaction: 5-Interacts appropriately 90% of the time - Needs monitoring or encouragement for participation or interaction.  Problem Solving Problem Solving: 6-Solves complex problems: With extra time  Memory Memory: 5-Recognizes or recalls 90% of the time/requires cueing < 10% of the time  Medical Problem List and Plan:  1.Right BKA  2. DVT Prophylaxis/Anticoagulation: Subcutaneous heparin. Monitor platelet counts and any signs of bleeding  3. Pain Management:tramadol 50 mg every 6 as needed moderate pain.  Hydrocodone1 tab every 6 as needed as needed. scheduled hydrocodone in am and at bed time each day.  -increase pamelor to 50mg  qhs 4. Mood: Valium 2 mg every 6 as needed anxiety. . Multiple social issues noted which affect her mood.  -continue 1:1 observation today. i don't believe she is a threat to harm self while here in hospital  -in much better spirits today. Sleeping a little better  -appreciate psych eval. Requesting f/u to see if sitter can be D/Ced---I believe we can dc sitter  5. Neuropsych: This patient is capable of making decisions on her own behalf.  6.Diabetes mellitus with peripheral neuropathy.Hemoglobin A1c 10.8.NovoLog 5  units 3 times a day, Lantus insulin 40 units twice a day.    -sugars better 7.Hypertension.: Coreg 3.125 mg twice a day,lisinopril 10 mg daily.--hold lisinopril given hypotension  8.Tobacco abuse. Counseling  9.  Skin- added eucerin Left foot BID, monitor skin LOS (Days) 8 A FACE TO FACE EVALUATION WAS PERFORMED  [name] [name] T 05/21/2013 7:53 AM

## 2013-05-21 NOTE — Progress Notes (Signed)
Physical Therapy Weekly Progress Note  Patient Details  Name: [name] [name] MRN: [id number] Date of Birth: [birth year]  Today's Date: 05/21/2013 Time: 0930-1030 Time Calculation (min): 60 min  Patient has met 2 of 7 long term goals.  Short term goals not set due to expected length of stay.   Patient continues to demonstrate the following deficits: decreased independence with transfers, decreased functional mobility, decreased standing balance, need for independence with HEP therefore will continue to benefit from skilled PT intervention to enhance overall performance with activity tolerance, balance and ability to compensate for deficits.  Patient progressing toward long term goals..  Plan of care revisions: goals updated due to progress.  PT Short Term Goals No short term goals set  First Session Skilled Therapeutic Interventions/Progress Updates:    Home environment and meal management while baking in kitchen with rec therapy. Discussed safety with appropriate transfer surfaces and reiterated importance of having supervision with standing mobility. Ambulation x 20' with RW hop gait with 2 (180 degree) turns, min-guard assist. Squat pivot transfers with supervision, cues to remove arm rest. HEP amputee exercise program performed with min cues. No c/o pain  Second Session Time: 1450-1515 Time Calculation (min): 25 min Skilled Therapeutic Interventions/Progress Updates:  Wheelchair propulsion for endurance and strengthening 2 x >200' with supervision/modified independence. Standing dynamic balance with one UE support watering plants + side hopping Rt/Lt over carpet performed in 2 bouts with min-guard/supervision. Pt demonstrates increased difficulty clearing foot on carpet. Pain no c/o pain   Therapy Documentation Precautions:  Precautions Precautions: Fall Precaution Comments: No NWB orders for Rt. LE, per typical protocol pt NWB on Rt. LE - requested order from RN.   Restrictions Weight Bearing Restrictions: Yes RLE Weight Bearing: Non weight bearing Pain: Pain Assessment Pain Assessment: No/denies pain Pain Score: 0-No pain  See FIM for current functional status  Therapy/Group: Individual Therapy second session, co-treat with rec therapy first session  Wilhemina Bonito 05/21/2013, 12:25 PM

## 2013-05-21 NOTE — Progress Notes (Signed)
Given pt's improved clinical appearance, and after consulting with the rest of the team, I have decided to remove the suicide precautions. Would still like psychiatry to weigh in on outpt aftercare.

## 2013-05-21 NOTE — Progress Notes (Signed)
Recreational Therapy Session Note  Patient Details  Name: [name] [name] MRN: [id number] Date of Birth: July 07, 1955 Today's Date: 05/21/2013  Pain: c/o HA, unrated, premedicated Skilled Therapeutic Interventions/Progress Updates: Session focused on activity tolerance, safety with functional transfers, short distance ambulation with RW, discussion about use of leisure time post discharge, & discharge planning.  Pt performed simple kitchen task with supervision/set up assist. [name] 05/21/2013, 2:17 PM

## 2013-05-22 ENCOUNTER — Inpatient Hospital Stay (HOSPITAL_COMMUNITY): Payer: Medicare Other | Admitting: Physical Therapy

## 2013-05-22 ENCOUNTER — Inpatient Hospital Stay (HOSPITAL_COMMUNITY): Payer: Medicare Other

## 2013-05-22 DIAGNOSIS — S88119A Complete traumatic amputation at level between knee and ankle, unspecified lower leg, initial encounter: Secondary | ICD-10-CM

## 2013-05-22 DIAGNOSIS — G547 Phantom limb syndrome without pain: Secondary | ICD-10-CM

## 2013-05-22 DIAGNOSIS — D62 Acute posthemorrhagic anemia: Secondary | ICD-10-CM

## 2013-05-22 DIAGNOSIS — I739 Peripheral vascular disease, unspecified: Secondary | ICD-10-CM

## 2013-05-22 DIAGNOSIS — L98499 Non-pressure chronic ulcer of skin of other sites with unspecified severity: Secondary | ICD-10-CM

## 2013-05-22 LAB — GLUCOSE, CAPILLARY
Glucose-Capillary: 139 mg/dL — ABNORMAL HIGH (ref 70–99)
Glucose-Capillary: 119 mg/dL — ABNORMAL HIGH (ref 70–99)

## 2013-05-22 MED ORDER — BACLOFEN 10 MG PO TABS
10.0000 mg | ORAL_TABLET | Freq: Every day | ORAL | Status: DC
  Administered 2013-05-22 – 2013-05-23 (×2): 10 mg via ORAL
  Filled 2013-05-22 (×3): qty 1

## 2013-05-22 NOTE — Progress Notes (Signed)
Subjective/Complaints: Mood improved. Had some pain in stump. Phantom limb pain a little better with pamelor. Worried about ramp assembly at home  A 12 point review of systems has been performed and if not noted above is otherwise negative.   Objective: Vital Signs: Blood pressure 151/84, pulse 101, temperature 98.3 F (36.8 C), temperature source Oral, resp. rate 17, weight 92.443 kg (203 lb 12.8 oz), SpO2 98.00%. No results found. No results found for this basename: WBC, HGB, HCT, PLT,  in the last 72 hours No results found for this basename: NA, K, CL, CO, GLUCOSE, BUN, CREATININE, CALCIUM,  in the last 72 hours CBG (last 3)   Recent Labs  05/21/13 1623 05/21/13 2156 05/22/13 0719  GLUCAP 92 113* 139*    Wt Readings from Last 3 Encounters:  05/22/13 92.443 kg (203 lb 12.8 oz)  05/09/13 90.8 kg (200 lb 2.8 oz)  05/09/13 90.8 kg (200 lb 2.8 oz)    Physical Exam:  Constitutional: She is oriented to person, place, and time.  HENT:  Head: Normocephalic and atraumatic.  Right Ear: External ear normal.  Left Ear: External ear normal.  Eyes: EOM are normal.  Neck: Normal range of motion. Neck supple. No JVD present. No tracheal deviation present. No thyromegaly present.  Cardiovascular: Normal rate and regular rhythm. No murmurs or rubs  Respiratory: Effort normal and breath sounds normal. No respiratory distress.  GI: Soft. Bowel sounds are normal. She exhibits no distension.  Musculoskeletal:  Right leg in post-op, coban dressing. Lymphadenopathy:  She has no cervical adenopathy.  Neurological: She is alert and oriented to person, place, and time. She displays normal reflexes. No cranial nerve deficit. Coordination normal. Can move right leg with assistance  Able to sense LT to Left foot Skin:  Postoperative dressing to BKA site. No visible drainage. Remaining skin intact Callus Left 1st MTP and heel, no breakdown Psychiatric: not anxious, . Non-tearful.       Assessment/Plan: 1. Functional deficits secondary to right BKA which require 3+ hours per day of interdisciplinary therapy in a comprehensive inpatient rehab setting. Physiatrist is providing close team supervision and 24 hour management of active medical problems listed below. Physiatrist and rehab team continue to assess barriers to discharge/monitor patient progress toward functional and medical goals.  Will remove dressing   FIM: FIM - Bathing Bathing Steps Patient Completed: Chest;Right Arm;Left Arm;Abdomen;Front perineal area;Buttocks;Right upper leg;Left upper leg;Right lower leg (including foot);Left lower leg (including foot) Bathing: 5: Supervision: Safety issues/verbal cues  FIM - Upper Body Dressing/Undressing Upper body dressing/undressing steps patient completed: Thread/unthread right sleeve of pullover shirt/dresss;Thread/unthread left sleeve of pullover shirt/dress;Put head through opening of pull over shirt/dress;Pull shirt over trunk Upper body dressing/undressing: 5: Supervision: Safety issues/verbal cues FIM - Lower Body Dressing/Undressing Lower body dressing/undressing steps patient completed: Thread/unthread right underwear leg;Thread/unthread left underwear leg;Pull underwear up/down;Thread/unthread right pants leg;Thread/unthread left pants leg;Pull pants up/down;Fasten/unfasten pants;Don/Doff left sock;Don/Doff left shoe;Fasten/unfasten left shoe Lower body dressing/undressing: 5: Supervision: Safety issues/verbal cues  FIM - Toileting Toileting steps completed by patient: Adjust clothing prior to toileting;Performs perineal hygiene;Adjust clothing after toileting Toileting Assistive Devices: Grab bar or rail for support Toileting: 4: Steadying assist  FIM - Diplomatic Services operational officer Devices: Bedside commode Toilet Transfers: 4-To toilet/BSC: Min A (steadying Pt. > 75%)  FIM - Bed/Chair Transfer Bed/Chair Transfer Assistive Devices: Arm  rests;Walker Bed/Chair Transfer: 5: Chair or W/C > Bed: Supervision (verbal cues/safety issues);5: Bed > Chair or W/C: Supervision (verbal cues/safety issues)  FIM -  Locomotion: Wheelchair Distance: 150 Locomotion: Wheelchair: 5: Travels 150 ft or more: maneuvers on rugs and over door sills with supervision, cueing or coaxing FIM - Locomotion: Ambulation Locomotion: Ambulation Assistive Devices: Designer, industrial/product Ambulation/Gait Assistance: Not tested (comment) Locomotion: Ambulation: 1: Travels less than 50 ft with supervision/safety issues  Comprehension Comprehension Mode: Auditory Comprehension: 6-Follows complex conversation/direction: With extra time/assistive device  Expression Expression Mode: Verbal Expression: 6-Expresses complex ideas: With extra time/assistive device  Social Interaction Social Interaction: 6-Interacts appropriately with others with medication or extra time (anti-anxiety, antidepressant).  Problem Solving Problem Solving: 6-Solves complex problems: With extra time  Memory Memory: 6-More than reasonable amt of time  Medical Problem List and Plan:  1.Right BKA  2. DVT Prophylaxis/Anticoagulation: Subcutaneous heparin. Monitor platelet counts and any signs of bleeding  3. Pain Management:tramadol 50 mg every 6 as needed moderate pain.  Hydrocodone1 tab every 6 as needed as needed. scheduled hydrocodone in am and at bed time each day.  -increased pamelor to 50mg  qhs  -add baclofen qhs for spasms and sleep 4. Mood: Valium 2 mg every 6 as needed anxiety. . Multiple social issues noted which affect her mood.  -continue 1:1 observation today. i don't believe she is a threat to harm self while here in hospital  -in much better spirits today. Sleeping a little better  -requesting psych f/u to make recs on aftercare  5. Neuropsych: This patient is capable of making decisions on her own behalf.  6.Diabetes mellitus with peripheral neuropathy.Hemoglobin A1c  10.8.NovoLog 5 units 3 times a day, Lantus insulin 40 units twice a day.    -sugars better 7.Hypertension.: Coreg 3.125 mg twice a day,lisinopril 10 mg daily.--hold lisinopril given hypotension  8.Tobacco abuse. Counseling  9.  Skin- added eucerin Left foot BID, monitor skin LOS (Days) 9 A FACE TO FACE EVALUATION WAS PERFORMED  [name] [name] T 05/22/2013 7:54 AM

## 2013-05-22 NOTE — Progress Notes (Signed)
Occupational Therapy Session Note  Patient Details  Name: [name] [name] MRN: [id number] Date of Birth: July 23, 1955  Today's Date: 05/22/2013 Time: 0730-0829 Time Calculation (min): 59 min  Short Term Goals: Week 2:  OT Short Term Goal 1 (Week 2): Patient will complete simple meal prep using LRAD with supervision OT Short Term Goal 2 (Week 2): Patient will demonstrate ability to complete HEP for UE strengthening, independently OT Short Term Goal 3 (Week 2): Patient will perform bathing and dressing, sitting and standing, using LRAD with supervision OT Short Term Goal 4 (Week 2): Patient will verbalize understanding of home safety recommendation  Skilled Therapeutic Interventions: ADL-retraining with emphasis on bath tub transfer using RW and tub bench, improved sit>stand, improved endurance, and re-ed on home safety recommendations.   Patient completed sit>stand from bed to w/c using RW unassisted and without need for verbal cues.   From w/c, patient ambulated using RW from door of bathroom to tub bench, approx 3', performing stand-pivot transfer to tub bench with only 1 verbal cue to move closer to tub bench before sitting.   Patient completed bathing seated on tub bench, using lateral leans to wash buttocks but reports that she will use her grab bar mounted diagonally in her tub to stand in order to complete bathing of buttocks, as needed.   Patient became fatigued after bathing and dressing and required 3 attempts to stand from tub bench to return to her w/c, requesting OT assist to bring chair closer to her to conserve her energy.   Patient reports that her husband has removed glass doors in bathroom, installed a shower curtain, and has tub bench installed awaiting her return.     Therapy Documentation Precautions:  Precautions Precautions: Fall Precaution Comments: No NWB orders for Rt. LE, per typical protocol pt NWB on Rt. LE - requested order from RN.  Restrictions Weight Bearing  Restrictions: No RLE Weight Bearing: Non weight bearing  Vital Signs: Therapy Vitals Pulse Rate: 102 (after therapy) BP: 99/66 mmHg  Pain: Pain Assessment Pain Assessment: No/denies pain  See FIM for current functional status  Therapy/Group: Individual Therapy  Second session: Time: 1300-1400 Time Calculation (min):  60 min  Pain Assessment: No pain  Skilled Therapeutic Interventions: Therapeutic exercises with emphasis on general endurance (arm ergometry) using SCI-Fit and progressive resistance exercises for bil UE (HEP) using thera-band and/or dumbbells (5 lbs, 2 lbs).   Patient performed 10 min, level 2, Sci-Fit for light warm-up activity and reported mild exertion with HR @ 100 bpm & bp @ 133/79.   Following warm-up, patient was instructed in HEP using thera-band grade 2 resistance, seated in w/c.  Patient required repeated demonstrations with use of thera-band and then offered that she had printed her own general strengthening program at home using a 5 lb dumbbell she purchased from a thrift shop.   OT re-educated patient on how to interpret exercise graphics to insure proper form to target selected muscle groups to achieve improved shoulder stabilization and to strengthen arms in order to improve performance of sit>stand.  Patient was attentive to instruction and retained literature provided with plan to purchase an additional lighter dumbbell (2 lbs).  See FIM for current functional status  Therapy/Group: Individual Therapy  [name] [name] 05/22/2013, 8:31 AM

## 2013-05-22 NOTE — Progress Notes (Signed)
Physical Therapy Note  Patient Details  Name: [name] [name] MRN: [id number] Date of Birth: 09-Mar-1956 Today's Date: 05/22/2013  1000-1055 (55 minutes) individual Pain: 6/10 RT BKA/ premedicated Focus of treatment: Therapeutic exercise focused on bilateral LE strengthening including RT BKA exercises in supine/sidelying Treatment: Pt up in wc upon arrival; wc mobility modified independent on unit 150 + feet level X 2; transfers stand/turn RW min assist for safety secondary to decreased UE strength + vcs for hand placement to prevent increased weight shift to right; Therapeutic exercises X 20 - bridging (bolster under RT LE), SLRs, sidelying hip flexion/extension/abduction (RT LE only), quad sets, left ankle pumps, SAQs, sit to stand to RW for quad strengthening especially eccentric); returned to room with all needs within reach.    1500-1525 (25 minutes) individual Pain: no reported pain ; pt reports unrated "soreness" bilateral UEs/ premedicated Focus of treatment: stepping forward/backward maintaining left knee extension vs "hopping"  Treatment: sit to stand to RW min assist to SBA with vcs for hand placement ; stepping forward /backward with vcs to maintain left knee extension (RW raised 2 steps). Pt able to perform with repetition but UEs quickly fatigue; returned to room with all needs within reach.   Jhada Risk,JIM 05/22/2013, 10:51 AM

## 2013-05-22 NOTE — Patient Care Conference (Signed)
Inpatient RehabilitationTeam Conference and Plan of Care Update Date: 05/21/2013   Time: 2:25 PM    Patient Name: [name] [name]      Medical Record Number: [id number]  Date of Birth: [birth year] Sex: Female         Room/Bed: 4M07C/4M07C-01 Payor Info: Payor: MEDICARE / Plan: MEDICARE PART A AND B / Product Type: *No Product type* /    Admitting Diagnosis: R BKA  Admit Date/Time:  05/13/2013  3:39 PM Admission Comments: No comment available   Primary Diagnosis:  <principal problem not specified> Principal Problem: <principal problem not specified>  Patient Active Problem List   Diagnosis Date Noted  . Hx of BKA 05/13/2013  . Diabetic foot infection 05/09/2013  . Sepsis 05/09/2013  . Accelerated hypertension 05/09/2013  . Depression 05/09/2013    Expected Discharge Date: Expected Discharge Date: 05/24/13  Team Members Present: Physician leading conference: Dr. Faith Rogue Social Worker Present: Amada Jupiter, LCSW Nurse Present: Keturah Barre, RN PT Present: Sherrine Maples, Grayland Ormond, PT OT Present: Donzetta Kohut, OT;Kris Gellert, OT;Patricia [place], OT PPS Coordinator present : Tora Duck, RN, CRRN;Becky Henrene Dodge, PT     Current Status/Progress Goal Weekly Team Focus  Medical   pain issues better., psychologically more stable. still has Comptroller. think we can dc  increase activity, stabilize psychologically  see above. remove dressing   Bowel/Bladder   continent of bowel and bladder  Remain continent of bowel and bladder  monitor for constipation; per patient only has BM about 1-2 per week   Swallow/Nutrition/ Hydration             ADL's   Min A with ADL and transfers, progressing with functional mobility using LRAD  Mod I with ADL and transfers  Dynamic standing balance, endurance, safety awareness, functional transfers using RW   Mobility   min/supervision transfers, min gait  mod I wheelchair level/min assist gait  safety at wheelchair level, transfers, pain  modulation, HEP, standing balance and ambulation   Communication             Safety/Cognition/ Behavioral Observations  pt under suicide precautions with sitter inplace         Pain   phantom pain to R stump with sharp pain at time; pain level 3 out to 10. doesnt complain much of pain throughout day. Tylenol 650mg  q4 hrs prn  Pain level 3 or less on a scale of 0-10  monitor pain and any onset of pain. medicate as needed   Skin   right BKA surgical dressing inplace; bruising to BUE and abdomen  No new skin breakdown and infection.  monitor and assess skin q shift    Rehab Goals Patient on target to meet rehab goals: Yes *See Care Plan and progress notes for long and short-term goals.  Barriers to Discharge: entry into house    Possible Resolutions to Barriers:  ramp    Discharge Planning/Teaching Needs:  home with only intermittent assistance from estranged husband (he is in the home primarily to take care of animals and upkeep of house)      Team Discussion:  Much improved with pain.  Meeting mod i w/c goals.  Concerns with ramp progress at home - SW to follow up.  Better from psych standpoint and d/c sitter today.  Revisions to Treatment Plan:  D/C sitter and suicide precautions   Continued Need for Acute Rehabilitation Level of Care: The patient requires daily medical management by a physician with specialized training in physical medicine  and rehabilitation for the following conditions: Daily direction of a multidisciplinary physical rehabilitation program to ensure safe treatment while eliciting the highest outcome that is of practical value to the patient.: Yes Daily medical management of patient stability for increased activity during participation in an intensive rehabilitation regime.: Yes Daily analysis of laboratory values and/or radiology reports with any subsequent need for medication adjustment of medical intervention for : Post surgical problems;Other (psych)  Marceil Welp,  Daryn Pisani 05/22/2013, 8:27 AM

## 2013-05-22 NOTE — Progress Notes (Signed)
Social Work Patient ID: Leisure centre manager, female   DOB: 04-13-1956, 57 y.o.   MRN: [id number]  Met with patient yesterday following team conference.  Pt aware and agreeable with plans to d/c sitter/ suicide precautions.  Aware with continued plan of d/c 12/19.  Pt reports that husband is off work the remainder of the week with plans to complete repair of ramp.  Will plan to touch base with him today to check status.  Sedra Morfin, LCSW

## 2013-05-23 ENCOUNTER — Inpatient Hospital Stay (HOSPITAL_COMMUNITY): Payer: Self-pay | Admitting: Physical Therapy

## 2013-05-23 ENCOUNTER — Inpatient Hospital Stay (HOSPITAL_COMMUNITY): Payer: Medicare Other

## 2013-05-23 ENCOUNTER — Encounter (HOSPITAL_COMMUNITY): Payer: Self-pay

## 2013-05-23 LAB — GLUCOSE, CAPILLARY
Glucose-Capillary: 103 mg/dL — ABNORMAL HIGH (ref 70–99)
Glucose-Capillary: 126 mg/dL — ABNORMAL HIGH (ref 70–99)

## 2013-05-23 MED ORDER — CEPHALEXIN 250 MG PO CAPS
250.0000 mg | ORAL_CAPSULE | Freq: Three times a day (TID) | ORAL | Status: DC
  Filled 2013-05-23 (×5): qty 1

## 2013-05-23 MED ORDER — CIPROFLOXACIN HCL 500 MG PO TABS
500.0000 mg | ORAL_TABLET | Freq: Two times a day (BID) | ORAL | Status: DC
  Administered 2013-05-23 – 2013-05-24 (×3): 500 mg via ORAL
  Filled 2013-05-23 (×7): qty 1

## 2013-05-23 NOTE — Progress Notes (Signed)
Recreational Therapy Session Note  Patient Details  Name: [name] [name] MRN: [id number] Date of Birth: Oct 10, 1955 Today's Date: 05/23/2013  Pain: no c/o Skilled Therapeutic Interventions/Progress Updates: Session focused on discharge planning, home exercise program given by PT, safe & appropriate activities for participation post d/c.  Pt states understanding.   Paula Busenbark 05/23/2013, 2:06 PM

## 2013-05-23 NOTE — Progress Notes (Signed)
Recreational Therapy Discharge Summary Patient Details  Name: [name] [name] MRN: [id number] Date of Birth: 1956-01-01 Today's Date: 05/23/2013  TR sessions focused on activity tolerance & identifying safe activities for participation post discharge.  Pt is discharging home with intermittent supervision from family.  Pt is able to identify safe activities & is able to problem solve through obstacles with Mod I w/c level.  Reasons for discharge: discharge from hospital  Patient/family agrees with progress made and goals achieved: Yes  [name] 05/23/2013, 4:42 PM

## 2013-05-23 NOTE — Discharge Summary (Signed)
Discharge summary job [phone]

## 2013-05-23 NOTE — Progress Notes (Signed)
Physical Therapy Discharge Summary  Patient Details  Name: [name] [name] MRN: [id number] Date of Birth: 08-08-1955  Today's Date: 05/23/2013 Time: 0930-1030 Time Calculation (min): 60 min  Car transfer with distant supervision. Wheelchair propulsion, set-up for transfers, and squat pivot transfers modified independent. Ram pwith wheelchair modified independent. Supervision for ambulation x 15' with RW, pt limited by fear. Pt aware she needs supervision whenever she is standing (standing exercises, getting something in the kitchen, or with ambulation). Pt has no further questions.   Patient has met 9 of 10 long term goals due to improved activity tolerance, improved balance, improved postural control, increased strength, decreased pain and ability to compensate for deficits.  Patient to discharge at a wheelchair level Modified Independent, supervision for short distance gait.   Patient's care partner is independent to provide the necessary physical assistance at discharge.  Reasons goals not met: Pt did not meet the distance of her gait goal (ambulated 15' instead of 25') however did meet supervision level of assist.  Recommendation:  Patient will benefit from ongoing skilled PT services in home health setting to continue to advance safe functional mobility, address ongoing impairments in standing balance, functional mobility, ambulation, pre-prosthetic training, and minimize fall risk.  Equipment: RW, wheelchair   Reasons for discharge: treatment goals met and discharge from hospital  Patient/family agrees with progress made and goals achieved: Yes  PT Discharge Precautions/Restrictions Restrictions Weight Bearing Restrictions: Yes RLE Weight Bearing: Non weight bearing Pain  no c/o  Cognition Orientation Level: Oriented X4 Comments: emotionally labile Sensation Sensation Light Touch: Impaired Detail Light Touch Impaired Details: Impaired LLE (particularly toes and bottom of  foot) Stereognosis: Appears Intact Hot/Cold: Appears Intact Proprioception: Impaired by gross assessment (Lt. LE) Coordination Gross Motor Movements are Fluid and Coordinated: Yes Fine Motor Movements are Fluid and Coordinated: Yes Motor  Motor Motor: Within Functional Limits  Mobility Bed Mobility Bed Mobility: Rolling Right;Right Sidelying to Sit;Sit to Supine;Left Sidelying to Sit;Rolling Left Rolling Right: 6: Modified independent (Device/Increase time) Rolling Left: 6: Modified independent (Device/Increase time) Rolling Left Details: Verbal cues for precautions/safety Right Sidelying to Sit: 6: Modified independent (Device/Increase time) Left Sidelying to Sit: 6: Modified independent (Device/Increase time) Supine to Sit: 6: Modified independent (Device/Increase time) Supine to Sit Details: Verbal cues for technique Sitting - Scoot to Edge of Bed: 6: Modified independent (Device/Increase time) Sit to Supine: 6: Modified independent (Device/Increase time) Transfers Transfers: Yes Sit to Stand: With armrests;With upper extremity assist;From bed;From chair/3-in-1;5: Supervision Sit to Stand Details: Verbal cues for technique;Tactile cues for posture Stand to Sit: 3: Mod assist;To chair/3-in-1;To bed Stand Pivot Transfers: 6: Modified independent (Device/Increase time) Stand Pivot Transfer Details (indicate cue type and reason): *partial squat pivot transfer Locomotion  Ambulation Ambulation: Yes Ambulation/Gait Assistance: 5: Supervision Ambulation Distance (Feet): 15 Feet Assistive device: Rolling walker Ambulation/Gait Assistance Details: Supervision for safety, pt limited by fear.  Gait Gait: Yes Gait Pattern: Impaired Gait Pattern: Step-to pattern;Decreased stride length;Trunk flexed Stairs / Additional Locomotion Stairs: No Ramp: 6: Modified independent (Device) (with wheelchair) Naval architect Mobility: Yes Wheelchair Assistance: 6: Modified  independent (Device/Increase time) Occupational hygienist: Both upper extremities;Left lower extremity Wheelchair Parts Management: Independent  Trunk/Postural Assessment  Cervical Assessment Cervical Assessment: Within Functional Limits Thoracic Assessment Thoracic Assessment: Within Functional Limits Lumbar Assessment Lumbar Assessment: Exceptions to [facility] Lumbar Strength Overall Lumbar Strength Comments: H/o lumbar stiffness, posterior pelvic tilt in standing Postural Control Postural Control: Within Functional Limits  Balance Static Sitting Balance Static Sitting - Balance  Support: No upper extremity supported;Feet unsupported Static Sitting - Level of Assistance: 6: Modified independent (Device/Increase time) Static Standing Balance Static Standing - Balance Support: Bilateral upper extremity supported Static Standing - Level of Assistance: 5: Stand by assistance (posterior lean) Dynamic Standing Balance Dynamic Standing - Balance Support: Bilateral upper extremity supported Dynamic Standing - Level of Assistance: 5: Stand by assistance Extremity Assessment      RLE Assessment RLE Assessment: Exceptions to [facility] RLE AROM (degrees) RLE Overall AROM Comments: Pt has full range of hip and knee extension, lacking some knee flexion due to bandage.  RLE Strength RLE Overall Strength Comments: Only able to lift Rt. LE against gravity, unable to test knee flexion/extension due to pain. Functionally stronger LLE Assessment LLE Assessment: Exceptions to WFL LLE AROM (degrees) LLE Overall AROM Comments: WFL LLE Strength LLE Overall Strength Comments: generalized deconditioning grossly >3+/5  See FIM for current functional status  Wilhemina Bonito 05/23/2013, 4:40 PM

## 2013-05-23 NOTE — Progress Notes (Signed)
Physical Therapy Session Note  Patient Details  Name: [name] [name] MRN: [id number] Date of Birth: 17-Mar-1956  Today's Date: 05/23/2013 Time: 5784-6962 Time Calculation (min): 25 min   Skilled Therapeutic Interventions/Progress Updates:  1:1. Pt received sitting in w/c, ready for therapy. Focus this session on reviewing goals/benefits of HH PT as well as reviewing HEP. Pt able to demonstrate 2x10reps of standing exercises w/ minimal cues for technique. Pt verbally directing therapist through supine exercises accurately. Pt only able to amb x5' w/ RW and close (S) this session 2/2 fatigue. Pt sitting in w/c with all needs in reach.   Therapy Documentation Precautions:  Precautions Precautions: Fall Precaution Comments: No NWB orders for Rt. LE, per typical protocol pt NWB on Rt. LE - requested order from RN.  Restrictions Weight Bearing Restrictions: Yes RLE Weight Bearing: Non weight bearing  See FIM for current functional status  Therapy/Group: Individual Therapy  Denzil Hughes 05/23/2013, 4:12 PM

## 2013-05-23 NOTE — Consult Note (Signed)
Neuropsychology Psychosocial Evaluation - Confidential [place] inpatient Rehabilitation _____________________________________________________________________________  Per request from the treatment team and recommendation from prior neuropsychological evaluation, I met with Mrs. Krisann Cohron for a session to provide supportive psychotherapy in the setting of below-knee amputation.  Mrs. Shiley's improvement in mood has continued since our last session and she continued to deny suicidal ideation.  However, she acknowledged experience of anxiety surrounding her transition home.  Her anxiety seems to be particularly related to not knowing how her physical limitations will impact her ability to complete activities at home, though she is also worried about which of her possessions her family may have discarded in an attempt to make her home more suitable for a wheelchair.   We spent time discussing risk for increased depression upon returning home and developed a safety plan (see below).  Mrs. Roes stated that she has planned that if she feels it is not working out at home, she will not "give up," but will rather find somewhere else to live.  She also said that she was agreeable to participating in either individual or group outpatient therapy and understood the importance of such participation, particularly since she does not feel as though she wants to take medication to reduce depression at this time.  She identified transportation as a possible barrier to obtaining psychotherapy and we discussed finding a program with evening hours or enlisting help from her husband to determine how she can get daytime transportation for therapy.  Any information on therapy programs with evening hours should be provided to her with her discharge paperwork.  Finally, information for her husband on signs of depression should be given to her husband upon discharge so that he can be aware of any signs of worsening mood  and help Mrs. Hoogland to seek immediate help, if needed.        DIAGNOSIS: Depression Right Below Knee Amputation  Greater than 50% of this visit was spent educating the patient about the possible diagnosis, prognosis, management plan, and in coordination of care.   Leavy Cella, PsyD Clinical Neuropsychologist

## 2013-05-23 NOTE — Progress Notes (Addendum)
Subjective/Complaints: Had a good night with pain. No new issues reported. Supplies for ramp arrived at home  A 12 point review of systems has been performed and if not noted above is otherwise negative.   Objective: Vital Signs: Blood pressure 133/70, pulse 98, temperature 98.3 F (36.8 C), temperature source Oral, resp. rate 20, weight 92.443 kg (203 lb 12.8 oz), SpO2 99.00%. No results found. No results found for this basename: WBC, HGB, HCT, PLT,  in the last 72 hours No results found for this basename: NA, K, CL, CO, GLUCOSE, BUN, CREATININE, CALCIUM,  in the last 72 hours CBG (last 3)   Recent Labs  05/22/13 1616 05/22/13 2040 05/23/13 0727  GLUCAP 105* 119* 103*    Wt Readings from Last 3 Encounters:  05/22/13 92.443 kg (203 lb 12.8 oz)  05/09/13 90.8 kg (200 lb 2.8 oz)  05/09/13 90.8 kg (200 lb 2.8 oz)    Physical Exam:  Constitutional: She is oriented to person, place, and time.  HENT:  Head: Normocephalic and atraumatic.  Right Ear: External ear normal.  Left Ear: External ear normal.  Eyes: EOM are normal.  Neck: Normal range of motion. Neck supple. No JVD present. No tracheal deviation present. No thyromegaly present.  Cardiovascular: Normal rate and regular rhythm. No murmurs or rubs  Respiratory: Effort normal and breath sounds normal. No respiratory distress.  GI: Soft. Bowel sounds are normal. She exhibits no distension.  Musculoskeletal:  Right leg in post-op, coban dressing. Lymphadenopathy:  She has no cervical adenopathy.  Neurological: She is alert and oriented to person, place, and time. She displays normal reflexes. No cranial nerve deficit. Coordination normal. Can move right leg with assistance  Able to sense LT to Left foot Skin:  Right BKA site well approximated---suture line irritated along medial half- minimal bleeding after dressing removed.  Callus Left 1st MTP and heel, no breakdown. Chronic vascular signs left leg Psychiatric: not  anxious, . Non-tearful.      Assessment/Plan: 1. Functional deficits secondary to right BKA which require 3+ hours per day of interdisciplinary therapy in a comprehensive inpatient rehab setting. Physiatrist is providing close team supervision and 24 hour management of active medical problems listed below. Physiatrist and rehab team continue to assess barriers to discharge/monitor patient progress toward functional and medical goals.     FIM: FIM - Bathing Bathing Steps Patient Completed: Chest;Right Arm;Left Arm;Abdomen;Front perineal area;Buttocks;Right upper leg;Left upper leg;Right lower leg (including foot);Left lower leg (including foot) Bathing: 6: Assistive device (Comment) (tub bench, using lateral leans for buttocks)  FIM - Upper Body Dressing/Undressing Upper body dressing/undressing steps patient completed: Thread/unthread right bra strap;Thread/unthread left bra strap;Hook/unhook bra;Thread/unthread right sleeve of pullover shirt/dresss;Thread/unthread left sleeve of pullover shirt/dress;Put head through opening of pull over shirt/dress;Pull shirt over trunk Upper body dressing/undressing: 7: Complete Independence: No helper FIM - Lower Body Dressing/Undressing Lower body dressing/undressing steps patient completed: Thread/unthread right underwear leg;Thread/unthread left underwear leg;Pull underwear up/down;Thread/unthread right pants leg;Thread/unthread left pants leg;Pull pants up/down;Fasten/unfasten pants;Don/Doff left sock;Don/Doff left shoe;Fasten/unfasten left shoe Lower body dressing/undressing: 6: More than reasonable amount of time  FIM - Toileting Toileting steps completed by patient: Adjust clothing prior to toileting;Performs perineal hygiene;Adjust clothing after toileting Toileting Assistive Devices: Grab bar or rail for support Toileting: 5: Supervision: Safety issues/verbal cues  FIM - Diplomatic Services operational officer Devices: Grab bars Toilet  Transfers: 4-To toilet/BSC: Min A (steadying Pt. > 75%);5-From toilet/BSC: Supervision (verbal cues/safety issues)  FIM - Banker Devices:  Arm rests Bed/Chair Transfer: 5: Bed > Chair or W/C: Supervision (verbal cues/safety issues);5: Chair or W/C > Bed: Supervision (verbal cues/safety issues)  FIM - Locomotion: Wheelchair Distance: 150 Locomotion: Wheelchair: 5: Travels 150 ft or more: maneuvers on rugs and over door sills with supervision, cueing or coaxing FIM - Locomotion: Ambulation Locomotion: Ambulation Assistive Devices: Designer, industrial/product Ambulation/Gait Assistance: Not tested (comment) Locomotion: Ambulation: 1: Travels less than 50 ft with supervision/safety issues  Comprehension Comprehension Mode: Auditory Comprehension: 6-Follows complex conversation/direction: With extra time/assistive device  Expression Expression Mode: Verbal Expression: 6-Expresses complex ideas: With extra time/assistive device  Social Interaction Social Interaction: 6-Interacts appropriately with others with medication or extra time (anti-anxiety, antidepressant).  Problem Solving Problem Solving: 6-Solves complex problems: With extra time  Memory Memory: 7-Complete Independence: No helper  Medical Problem List and Plan:  1.Right BKA  2. DVT Prophylaxis/Anticoagulation: Subcutaneous heparin. Monitor platelet counts and any signs of bleeding  3. Pain Management:tramadol 50 mg every 6 as needed moderate pain.  Hydrocodone1 tab every 6 as needed as needed. scheduled hydrocodone in am and at bed time each day.  -increased pamelor to 50mg  qhs  -added baclofen qhs for spasms and sleep---this helped 4. Mood: Valium 2 mg every 6 as needed anxiety. . Multiple social issues noted which affect her mood.  -improved although had some struggles when she saw the leg today.  -requesting psych f/u to make recs on aftercare  5. Neuropsych: This patient is capable of  making decisions on her own behalf.  6.Diabetes mellitus with peripheral neuropathy.Hemoglobin A1c 10.8.NovoLog 5 units 3 times a day, Lantus insulin 40 units twice a day.    -sugars better 7.Hypertension.: Coreg 3.125 mg twice a day,lisinopril 10 mg daily.--hold lisinopril given hypotension  8.Tobacco abuse. Counseling  9.  Skin- added eucerin Left foot BID, monitor skin  -dressing orders written for leg. i applied dressing today  -add keflex for wound covg LOS (Days) 10 A FACE TO FACE EVALUATION WAS PERFORMED  SWARTZ,ZACHARY T 05/23/2013 7:50 AM

## 2013-05-23 NOTE — Progress Notes (Signed)
Occupational Therapy Session Note  Patient Details  Name: [name] [name] MRN: [id number] Date of Birth: January 15, 1956  Today's Date: 05/23/2013 Time: 0730-0828 Time Calculation (min): 58 min  Short Term Goals: Week 2:  OT Short Term Goal 1 (Week 2): Patient will complete simple meal prep using LRAD with supervision OT Short Term Goal 2 (Week 2): Patient will demonstrate ability to complete HEP for UE strengthening, independently OT Short Term Goal 3 (Week 2): Patient will perform bathing and dressing, sitting and standing, using LRAD with supervision OT Short Term Goal 4 (Week 2): Patient will verbalize understanding of home safety recommendation  Skilled Therapeutic Interventions: ADL-retraining with emphasis on benefit of sustained participation in selected occupations to include self-care, homemaking, pet care, and various recreational interests.   Patient admitted to emotional dis-ease from observing her residual limb while MD inspected wound, changed dressing and applied ace-wrap.   Patient initially voiced disinterest in therapies due to depressed mood but responded to re-motivations to re-engage in treatment, bathe and dress and continue progress to return to her home.   Patient was escorted to gym where she completed 10 min of therex using Sci-[employer] while sustaining conversations about her pets, which improved her affect.   Pt was then returned to her room to complete self-feeding and bathing and dressing upper body at sink.   Patient completed session by performing grooming at sink unassisted with no further demonstrations of emotional lability.    Therapy Documentation Precautions:  Precautions Precautions: Fall Precaution Comments: No NWB orders for Rt. LE, per typical protocol pt NWB on Rt. LE - requested order from RN.  Restrictions Weight Bearing Restrictions: Yes RLE Weight Bearing: Non weight bearing  Vital Signs: Therapy Vitals Temp: 98.3 F (36.8 C) Temp src: Oral Pulse  Rate: 96 Resp: 20 BP: 139/72 mmHg Oxygen Therapy SpO2: 99 % O2 Device: None (Room air)  Pain: Pain Assessment Pain Assessment: No/denies pain  Exercises: Cardiovascular Exercises Upper Body Ergometer: 10 min, level 2.5 Other Treatments:    See FIM for current functional status  Therapy/Group: Individual Therapy  Second session: Time: 1300-1345 Time Calculation (min):  45 min  Pain Assessment: No report of pain  Skilled Therapeutic Interventions: ADL-retraining with emphasis on dynamic standing balance, adapted dressing, functional transfers using RW (bed, tub bench), and re-ed on home safety recommendations.   Patient performed stand-pivot transfer from bathroom doorway to standard tub and tub bench unassisted but confides that she plans to adapt her bathing schedule to wait until her husband returns at night before using tub.  Patient states that she remains fearful of falls despite evidence of improved standing balance and ability to rise from w/c to RW without assistance.   Patient performed bed transfer and mobility without use of bedrails, using squat-pivot transfer from w/c.   Patient completes toilet hygiene using lateral leans and transfers to standard toilet unassisted.    Patient dons underwear and pants also using lateral leans although she is able to use cabinet with upper arm as support while pulling up her pants.  Patient has performed simple meal prep with physical therapy, making a sandwich, and with recreation therapy, preparing muffins, at w/c level, with improved awareness of w/c management skills needed and compensatory strategies required for meal prep at w/c level.   Patient was re-educated on home safety and DME needs and she reported that her husband now has their new elevated toilet seat with rails and is expecting delivery of tub bench this date.   He  continues to work on ramp and is nearly finished;  patient reports confidence that he will assist her with  access into home, as needed, while finishing handrails for ramp within a few days.   See FIM for current functional status  Therapy/Group: Individual Therapy  Marene Gilliam 05/23/2013, 8:30 AM

## 2013-05-24 LAB — GLUCOSE, CAPILLARY: Glucose-Capillary: 112 mg/dL — ABNORMAL HIGH (ref 70–99)

## 2013-05-24 MED ORDER — TRAMADOL HCL 50 MG PO TABS: 50.0000 mg | ORAL_TABLET | Freq: Four times a day (QID) | ORAL | Status: AC | PRN

## 2013-05-24 MED ORDER — CIPROFLOXACIN HCL 500 MG PO TABS: 500.0000 mg | ORAL_TABLET | Freq: Two times a day (BID) | ORAL | Status: DC

## 2013-05-24 MED ORDER — HYDROCODONE-ACETAMINOPHEN 7.5-325 MG PO TABS: 1.0000 | ORAL_TABLET | Freq: Four times a day (QID) | ORAL | Status: AC | PRN

## 2013-05-24 MED ORDER — BACLOFEN 10 MG PO TABS: 10.0000 mg | ORAL_TABLET | Freq: Every day | ORAL | Status: AC

## 2013-05-24 MED ORDER — ASPIRIN 81 MG PO TABS: 324.0000 mg | ORAL_TABLET | Freq: Every day | ORAL | Status: AC

## 2013-05-24 MED ORDER — METHOCARBAMOL 500 MG PO TABS: 500.0000 mg | ORAL_TABLET | Freq: Four times a day (QID) | ORAL | Status: AC | PRN

## 2013-05-24 MED ORDER — INSULIN GLARGINE 100 UNIT/ML ~~LOC~~ SOLN: 40.0000 [IU] | Freq: Two times a day (BID) | SUBCUTANEOUS | Status: AC

## 2013-05-24 MED ORDER — CARVEDILOL 3.125 MG PO TABS: 3.1250 mg | ORAL_TABLET | Freq: Two times a day (BID) | ORAL | Status: AC

## 2013-05-24 MED ORDER — INSULIN ASPART 100 UNIT/ML ~~LOC~~ SOLN: 5.0000 [IU] | Freq: Three times a day (TID) | SUBCUTANEOUS | Status: AC

## 2013-05-24 MED ORDER — DIAZEPAM 2 MG PO TABS: 2.0000 mg | ORAL_TABLET | Freq: Four times a day (QID) | ORAL | Status: AC | PRN

## 2013-05-24 MED ORDER — NORTRIPTYLINE HCL 50 MG PO CAPS: 50.0000 mg | ORAL_CAPSULE | Freq: Every day | ORAL | Status: AC

## 2013-05-24 NOTE — Progress Notes (Signed)
Social Work  Discharge Note  The overall goal for the admission was met for:   Discharge location: Yes - home with intermittent assist of husband  Length of Stay: Yes - 11 days  Discharge activity level: Yes - modified independent w/c  Home/community participation: Yes  Services provided included: MD, RD, PT, OT, RN, TR, Pharmacy, Neuropsych and SW  Financial Services: Medicare  Follow-up services arranged: Home Health: RN, PT, OT, SW via [facility], DME: 18x18 lightweight w/c with right amputee support pad, cushion, rolling walker via [facility], Other: referred for restart of counseling services via [facility] in [place] and Patient/Family has no preference for HH/DME agencies  Comments (or additional information):  Patient/Family verbalized understanding of follow-up arrangements: Yes  Individual responsible for coordination of the follow-up plan: patient  Confirmed correct DME delivered: [name] [name] 05/24/2013    Latrica Clowers

## 2013-05-24 NOTE — Progress Notes (Signed)
Subjective/Complaints: Did well last night. In good spirits this morning. Pain controlled A 12 point review of systems has been performed and if not noted above is otherwise negative.   Objective: Vital Signs: Blood pressure 144/76, pulse 95, temperature 98.2 F (36.8 C), temperature source Oral, resp. rate 18, weight 92.443 kg (203 lb 12.8 oz), SpO2 98.00%. No results found. No results found for this basename: WBC, HGB, HCT, PLT,  in the last 72 hours No results found for this basename: NA, K, CL, CO, GLUCOSE, BUN, CREATININE, CALCIUM,  in the last 72 hours CBG (last 3)   Recent Labs  05/23/13 1643 05/23/13 2035 05/24/13 0715  GLUCAP 126* 83 112*    Wt Readings from Last 3 Encounters:  05/22/13 92.443 kg (203 lb 12.8 oz)  05/09/13 90.8 kg (200 lb 2.8 oz)  05/09/13 90.8 kg (200 lb 2.8 oz)    Physical Exam:  Constitutional: She is oriented to person, place, and time.  HENT:  Head: Normocephalic and atraumatic.  Right Ear: External ear normal.  Left Ear: External ear normal.  Eyes: EOM are normal.  Neck: Normal range of motion. Neck supple. No JVD present. No tracheal deviation present. No thyromegaly present.  Cardiovascular: Normal rate and regular rhythm. No murmurs or rubs  Respiratory: Effort normal and breath sounds normal. No respiratory distress.  GI: Soft. Bowel sounds are normal. She exhibits no distension.  Musculoskeletal:  Right leg in post-op, coban dressing. Lymphadenopathy:  She has no cervical adenopathy.  Neurological: She is alert and oriented to person, place, and time. She displays normal reflexes. No cranial nerve deficit. Coordination normal. Can move right leg with assistance  Able to sense LT to Left foot Skin:  Right BKA site well approximated---suture line still irritated along medial half- minimal to no bleeding noted. Callus Left 1st MTP and heel, no breakdown. Chronic vascular signs left leg Psychiatric: not anxious, . Non-tearful.       Assessment/Plan: 1. Functional deficits secondary to right BKA which require 3+ hours per day of interdisciplinary therapy in a comprehensive inpatient rehab setting. Physiatrist is providing close team supervision and 24 hour management of active medical problems listed below. Physiatrist and rehab team continue to assess barriers to discharge/monitor patient progress toward functional and medical goals.  Dc home today. i would [facility] about a [facility]. Continue ace wrap for now----shrinker over the next week or two   FIM: FIM - Bathing Bathing Steps Patient Completed: Chest;Right Arm;Left Arm;Abdomen;Front perineal area;Buttocks;Right upper leg;Left upper leg;Right lower leg (including foot);Left lower leg (including foot) Bathing: 6: Assistive device (Comment)  FIM - Upper Body Dressing/Undressing Upper body dressing/undressing steps patient completed: Thread/unthread right sleeve of pullover shirt/dresss;Thread/unthread left sleeve of pullover shirt/dress;Put head through opening of pull over shirt/dress;Pull shirt over trunk Upper body dressing/undressing: 7: Complete Independence: No helper FIM - Lower Body Dressing/Undressing Lower body dressing/undressing steps patient completed: Thread/unthread right underwear leg;Thread/unthread left underwear leg;Pull underwear up/down;Thread/unthread right pants leg;Thread/unthread left pants leg;Pull pants up/down (using lateral leans) Lower body dressing/undressing: 6: More than reasonable amount of time  FIM - Toileting Toileting steps completed by patient: Adjust clothing prior to toileting;Performs perineal hygiene;Adjust clothing after toileting Toileting Assistive Devices: Grab bar or rail for support Toileting: 6: More than reasonable amount of time  FIM - Diplomatic Services operational officer Devices: Grab bars Toilet Transfers: 6-To toilet/ BSC;6-From toilet/BSC  FIM - Physiological scientist Devices: Arm rests Bed/Chair  Transfer: 7: Supine > Sit: No assist;7: Sit > Supine: No assist;6: Chair or W/C > Bed: No assist;6: Bed > Chair or W/C: No assist  FIM - Locomotion: Wheelchair Distance: 150 Locomotion: Wheelchair: 6: Travels 150 ft or more, turns around, maneuvers to table, bed or toilet, negotiates 3% grade: maneuvers on rugs and over door sills independently FIM - Locomotion: Ambulation Locomotion: Ambulation Assistive Devices: Designer, industrial/product Ambulation/Gait Assistance: 5: Supervision Locomotion: Ambulation: 1: Travels less than 50 ft with supervision/safety issues  Comprehension Comprehension Mode: Auditory Comprehension: 6-Follows complex conversation/direction: With extra time/assistive device  Expression Expression Mode: Verbal Expression: 6-Expresses complex ideas: With extra time/assistive device  Social Interaction Social Interaction: 6-Interacts appropriately with others with medication or extra time (anti-anxiety, antidepressant).  Problem Solving Problem Solving: 5-Solves complex 90% of the time/cues < 10% of the time  Memory Memory: 7-Complete Independence: No helper  Medical Problem List and Plan:  1.Right BKA  2. DVT Prophylaxis/Anticoagulation: Subcutaneous heparin. Monitor platelet counts and any signs of bleeding  3. Pain Management:tramadol 50 mg every 6 as needed moderate pain.  Hydrocodone1 tab every 6 as needed as needed. scheduled hydrocodone in am and at bed time each day.  -increased pamelor to 50mg  qhs--good results  -added baclofen qhs for spasms and sleep---continue 4. Mood: Valium 2 mg every 6 as needed anxiety. . Multiple social issues noted which affect her mood.  -improved although had some struggles when she saw the leg today.  -appreciate psychology follow up yesterday (psychiatry NEVER came back even after being requested)  5. Neuropsych: This patient is capable of making decisions on her  own behalf.  6.Diabetes mellitus with peripheral neuropathy.Hemoglobin A1c 10.8.NovoLog 5 units 3 times a day, Lantus insulin 40 units twice a day.    -sugars better 7.Hypertension.: Coreg 3.125 mg twice a day,lisinopril 10 mg daily.--hold lisinopril given hypotension  8.Tobacco abuse. Counseling  9.  Skin- added eucerin Left foot BID, monitor skin  -dressing daily to stump  -added keflex for wound covg--continue for one week from today LOS (Days) 11 A FACE TO FACE EVALUATION WAS PERFORMED  SWARTZ,ZACHARY T 05/24/2013 8:03 AM

## 2013-05-24 NOTE — Progress Notes (Signed)
Patient received written and verbal discharge instructions from Deatra Ina, PA. Patient denies any questions of concerns with dressing changes or medications, aware of follow up appointments and home health arrangements, personal belongings packed by patient and staff, patient taken down by NT via wheelchair. Roberts-VonCannon, Yeilyn Gent Elon Jester

## 2013-05-24 NOTE — Progress Notes (Signed)
Occupational Therapy Discharge Summary  Patient Details  Name: [name] [name] MRN: [id number] Date of Birth: 11-20-1955  Today's Date: 05/24/2013  Patient has met 6 of 7 long term goals due to improved activity tolerance, improved balance and ability to compensate for deficits.  Patient to discharge at overall Modified Independent level.  Patient's care partner is independent to provide the necessary physical assistance (stand by) at discharge to supervise bathing at patient's request.    Reasons goals not met: Patient remains anxious   Recommendation:  Patient will benefit from ongoing skilled OT services in home health setting to continue to advance functional skills in the area of BADL and iADL.  Equipment: No equipment provided  Reasons for discharge: treatment goals met  Patient/family agrees with progress made and goals achieved: Yes  OT Discharge Precautions/Restrictions  Precautions Precautions: Fall Precaution Comments: No NWB orders for Rt. LE, per typical protocol pt NWB on Rt. LE - requested order from RN.  Restrictions Weight Bearing Restrictions: Yes RLE Weight Bearing: Non weight bearing   Pain Pain Assessment Pain Assessment: No/denies pain  ADL ADL ADL Comments:  (see FIM)  Vision/Perception  Vision - History Baseline Vision: Wears glasses all the time Vision - Assessment Eye Alignment: Within Functional Limits Perception Perception: Within Functional Limits Praxis Praxis: Intact   Cognition Orientation Level: Oriented X4 Attention: Divided Divided Attention: Appears intact Memory: Appears intact Awareness: Appears intact Problem Solving: Appears intact Safety/Judgment: Appears intact Comments: episodes of emotionally labity  Sensation Sensation Light Touch: Impaired Detail Light Touch Impaired Details: Impaired LLE;Impaired LUE (at small finger) Stereognosis: Appears Intact Hot/Cold: Appears Intact Proprioception: Appears  Intact Coordination Gross Motor Movements are Fluid and Coordinated: Yes Fine Motor Movements are Fluid and Coordinated: Yes  Motor  Motor Motor: Within Functional Limits  Mobility  Bed Mobility Rolling Right: 6: Modified independent (Device/Increase time) Rolling Left: 6: Modified independent (Device/Increase time) Right Sidelying to Sit: 6: Modified independent (Device/Increase time) Left Sidelying to Sit: 6: Modified independent (Device/Increase time) Supine to Sit: 6: Modified independent (Device/Increase time) Sitting - Scoot to Edge of Bed: 6: Modified independent (Device/Increase time) Sit to Supine: 6: Modified independent (Device/Increase time) Transfers Transfers: Sit to Stand;Stand to Sit Sit to Stand: With armrests;With upper extremity assist;From bed;From chair/3-in-1;5: Supervision Sit to Stand Details: Verbal cues for technique;Tactile cues for posture Stand to Sit: 4: Min guard   Trunk/Postural Assessment  Cervical Assessment Cervical Assessment: Within Functional Limits Thoracic Assessment Thoracic Assessment: Within Functional Limits Postural Control Postural Control: Within Functional Limits   Balance Static Sitting Balance Static Sitting - Balance Support: No upper extremity supported;Feet unsupported Static Sitting - Level of Assistance: 6: Modified independent (Device/Increase time) Static Standing Balance Static Standing - Balance Support: Bilateral upper extremity supported Dynamic Standing Balance Dynamic Standing - Balance Support: Bilateral upper extremity supported Dynamic Standing - Level of Assistance: 5: Stand by assistance  Extremity/Trunk Assessment RUE Assessment RUE Assessment: Within Functional Limits LUE Assessment LUE Assessment: Within Functional Limits  See FIM for current functional status  [name] 05/24/2013, 4:03 PM

## 2013-05-24 NOTE — Discharge Summary (Signed)
[name] [name], CONTINO            ACCOUNT NO.:  [id number]  MEDICAL RECORD NO.:  [id number]  LOCATION:  4M07C                        FACILITY:  [facility]  PHYSICIAN:  Mariam Dollar, P.A.  DATE OF BIRTH:  07/01/1955  DATE OF ADMISSION:  05/13/2013 DATE OF DISCHARGE:                              DISCHARGE SUMMARY   DISCHARGE DIAGNOSES: 1. Right below-knee amputation. 2. Subcutaneous heparin for deep vein thrombosis prophylaxis. 3. Pain management. 4. Mood. 5. Diabetes mellitus with peripheral neuropathy. 6. Hypertension. 7. Tobacco abuse.  HISTORY OF PRESENT ILLNESS:  This is a 57 year old right-handed female, history of diabetes mellitus, uncontrolled as well as peripheral vascular disease who presented on May 09, 2013, with nonhealing right foot ulcer as well as fever.  The patient had reportedly stepped on a piece of glass back in September 2014, with poor wound healing. She had undergone wound care by [employer].  MRI of the right foot showed marked soft tissue swelling and air in the plantar surface of the right foot consistent with infection.  No change with conservative care and ultimately underwent right below-knee amputation on May 11, 2013, per Dr. Lajoyce Corners.  Postoperative pain management. Subcutaneous heparin for DVT prophylaxis.  Hemoglobin A1c of 10.8 with insulin therapy as directed.  Physical and occupational therapy ongoing. The patient was admitted for comprehensive rehab program.  PAST MEDICAL HISTORY:  See discharge diagnoses.  SOCIAL HISTORY:  The patient lives alone.  She has an estranged husband who does check on her.  Functional history prior to admission independent.  Functional status upon admission to rehab services was needing assistance for sit-to-stand, as well as transfers, ambulation not tested.  PHYSICAL EXAMINATION:  VITAL SIGNS:  Blood pressure 147/79, pulse 90, temperature 98, respirations 17. GENERAL:  This was an alert female,  she was somewhat emotional, labile, oriented x3. HEENT:  Pupils round and reactive to light. LUNGS:  Clear to auscultation. CARDIAC:  Regular rate and rhythm. EXTREMITIES:  Her right leg postop dressing was dry and intact.  REHABILITATION HOSPITAL COURSE:  The patient was admitted to inpatient rehab services with therapies initiated on a 3-hour daily basis consisting of physical therapy, occupational therapy, and rehabilitation nursing.  The following issues were addressed during the patient's rehabilitation stay.  Pertaining to [name]'s right below-knee amputation, surgical site healing nicely.  She would follow up with [facility], Dr. Lajoyce Corners, as well as outpatient rehab services. Subcutaneous heparin ongoing for DVT prophylaxis and no bleeding episodes.  Pain management with the use of Ultram as well as Robaxin as needed.  She did have some sensitivities to narcotics.  Baclofen was added to her regimen for spasms as well as Pamelor for some phantom pain with good results.  Noted during the patient's hospital course, she did have some emotional liability, multiple social issues affecting her mood, continued observation as advised.  She did receive follow up by [facility].  She initially did have a sitter due to some threat that she made against herself.  Her spirits continued to improve as her mobility improved.  Multiple requests were made with [facility]iatry [facility] to follow up for recommendations on aftercare.  Her estranged husband would continue to check on her at  home.  She did have a history of diabetes mellitus with peripheral neuropathy.  Hemoglobin A1c of 10.8. Insulin therapy ongoing as directed.  Full family teaching as in regard to her diabetes mellitus.  Her blood pressures remained well controlled on Coreg.  She did have a history of tobacco abuse.  She received full counseling in regard to cessation of smoking.  It was questionable if she would be  compliant with these request.  This patient continued to attend therapies.  She did very well.  She was independent with her wheelchair transfers, stand, turn, rolling walker minimal assist for safety, needed some cues for hand placement.  Sit to stand, a rolling walker with standby assistance.  She completed sit-to-stand from bed-to- wheelchair using a rolling walker unassisted.  The patient ambulated using a rolling walker from door to the bathroom to tub bench performing stand pivot transfers to tub bench with only 1 verbal cue to move closer to the bench before sitting.  Ongoing teaching was provided for the patient, anticipated discharge of December 19.  The patient was agreeable to discharge.  Her estranged husband was to work on maintaining a ramp for the home as well as continued to check on her and ongoing therapies arranged.  DISCHARGE MEDICATIONS: 1. Aspirin 325 mg p.o. daily. 2. Baclofen 10 mg p.o. daily. 3. Coreg 3.125 mg p.o. b.i.d. 4. Valium 2 mg every 6 as needed anxiety. 5. NovoLog 5 units t.i.d. 6. Lantus insulin 40 units subcu b.i.d. 7. Robaxin 500 mg p.o. every 6 hours as needed for muscle spasms. 8. Pamelor 50 mg p.o. at bedtime. 9. Ultram 50 mg p.o. every 6 hours as needed, moderate pain. 10.She was also using hydrocodone 1 tablet every 6 hours as needed,     and medications to be simplified at the time of discharge.  DIET:  Diabetic diet.  SPECIAL INSTRUCTIONS:  Continue therapies as arranged per rehab services.  The patient would follow up Dr. Faith Rogue, at the outpatient rehab service office June 28, 2013.  Dr. Aldean Baker, [facility] 2 weeks call for appointment; and Dr. Cathleen Corti at [facility], [place], [address].     Mariam Dollar, P.A.     DA/MEDQ  D:  05/23/2013  T:  05/24/2013  Job:  [id number]  cc:   [name] Mustard, MD [name] Slipper, MD Cathleen Corti

## 2013-06-20 DIAGNOSIS — E1169 Type 2 diabetes mellitus with other specified complication: Secondary | ICD-10-CM

## 2013-06-20 DIAGNOSIS — A419 Sepsis, unspecified organism: Secondary | ICD-10-CM

## 2013-06-20 DIAGNOSIS — E1142 Type 2 diabetes mellitus with diabetic polyneuropathy: Secondary | ICD-10-CM

## 2013-06-20 DIAGNOSIS — Z4789 Encounter for other orthopedic aftercare: Secondary | ICD-10-CM

## 2013-06-28 ENCOUNTER — Telehealth: Payer: Self-pay

## 2013-06-28 ENCOUNTER — Encounter: Payer: Medicare Other | Admitting: Physical Medicine & Rehabilitation

## 2013-06-28 NOTE — Telephone Encounter (Signed)
FYI:: David(PT @ [facility]) called to inform you patient missed therapy visit on 06/13/12 because she had another appointment.

## 2013-12-02 ENCOUNTER — Other Ambulatory Visit: Payer: Self-pay | Admitting: Ophthalmology

## 2014-08-06 DIAGNOSIS — Z89511 Acquired absence of right leg below knee: Secondary | ICD-10-CM | POA: Diagnosis not present

## 2014-08-06 DIAGNOSIS — R2689 Other abnormalities of gait and mobility: Secondary | ICD-10-CM | POA: Diagnosis not present

## 2014-08-06 DIAGNOSIS — R262 Difficulty in walking, not elsewhere classified: Secondary | ICD-10-CM | POA: Diagnosis not present

## 2014-08-06 DIAGNOSIS — M6281 Muscle weakness (generalized): Secondary | ICD-10-CM | POA: Diagnosis not present

## 2014-08-08 DIAGNOSIS — R2689 Other abnormalities of gait and mobility: Secondary | ICD-10-CM | POA: Diagnosis not present

## 2014-08-08 DIAGNOSIS — M6281 Muscle weakness (generalized): Secondary | ICD-10-CM | POA: Diagnosis not present

## 2014-08-08 DIAGNOSIS — R262 Difficulty in walking, not elsewhere classified: Secondary | ICD-10-CM | POA: Diagnosis not present

## 2014-08-08 DIAGNOSIS — Z89511 Acquired absence of right leg below knee: Secondary | ICD-10-CM | POA: Diagnosis not present

## 2014-08-12 DIAGNOSIS — Z89511 Acquired absence of right leg below knee: Secondary | ICD-10-CM | POA: Diagnosis not present

## 2014-08-12 DIAGNOSIS — M6281 Muscle weakness (generalized): Secondary | ICD-10-CM | POA: Diagnosis not present

## 2014-08-12 DIAGNOSIS — R2689 Other abnormalities of gait and mobility: Secondary | ICD-10-CM | POA: Diagnosis not present

## 2014-08-12 DIAGNOSIS — R262 Difficulty in walking, not elsewhere classified: Secondary | ICD-10-CM | POA: Diagnosis not present

## 2014-08-14 DIAGNOSIS — M6281 Muscle weakness (generalized): Secondary | ICD-10-CM | POA: Diagnosis not present

## 2014-08-14 DIAGNOSIS — Z89511 Acquired absence of right leg below knee: Secondary | ICD-10-CM | POA: Diagnosis not present

## 2014-08-14 DIAGNOSIS — R2689 Other abnormalities of gait and mobility: Secondary | ICD-10-CM | POA: Diagnosis not present

## 2014-08-14 DIAGNOSIS — R262 Difficulty in walking, not elsewhere classified: Secondary | ICD-10-CM | POA: Diagnosis not present

## 2014-08-19 DIAGNOSIS — Z89511 Acquired absence of right leg below knee: Secondary | ICD-10-CM | POA: Diagnosis not present

## 2014-08-19 DIAGNOSIS — M6281 Muscle weakness (generalized): Secondary | ICD-10-CM | POA: Diagnosis not present

## 2014-08-19 DIAGNOSIS — R2689 Other abnormalities of gait and mobility: Secondary | ICD-10-CM | POA: Diagnosis not present

## 2014-08-19 DIAGNOSIS — R262 Difficulty in walking, not elsewhere classified: Secondary | ICD-10-CM | POA: Diagnosis not present

## 2014-08-21 DIAGNOSIS — R262 Difficulty in walking, not elsewhere classified: Secondary | ICD-10-CM | POA: Diagnosis not present

## 2014-08-21 DIAGNOSIS — R2689 Other abnormalities of gait and mobility: Secondary | ICD-10-CM | POA: Diagnosis not present

## 2014-08-21 DIAGNOSIS — M6281 Muscle weakness (generalized): Secondary | ICD-10-CM | POA: Diagnosis not present

## 2014-08-21 DIAGNOSIS — Z89511 Acquired absence of right leg below knee: Secondary | ICD-10-CM | POA: Diagnosis not present

## 2014-08-22 DIAGNOSIS — L0390 Cellulitis, unspecified: Secondary | ICD-10-CM | POA: Diagnosis not present

## 2014-08-22 DIAGNOSIS — M7989 Other specified soft tissue disorders: Secondary | ICD-10-CM | POA: Diagnosis not present

## 2014-08-22 DIAGNOSIS — M7089 Other soft tissue disorders related to use, overuse and pressure multiple sites: Secondary | ICD-10-CM | POA: Diagnosis not present

## 2014-08-22 DIAGNOSIS — E119 Type 2 diabetes mellitus without complications: Secondary | ICD-10-CM | POA: Diagnosis not present

## 2014-08-22 DIAGNOSIS — L98499 Non-pressure chronic ulcer of skin of other sites with unspecified severity: Secondary | ICD-10-CM | POA: Diagnosis not present

## 2014-08-28 DIAGNOSIS — M79605 Pain in left leg: Secondary | ICD-10-CM | POA: Diagnosis not present

## 2014-08-28 DIAGNOSIS — R600 Localized edema: Secondary | ICD-10-CM | POA: Diagnosis not present

## 2014-08-28 DIAGNOSIS — R609 Edema, unspecified: Secondary | ICD-10-CM | POA: Diagnosis not present

## 2014-08-28 DIAGNOSIS — I8290 Acute embolism and thrombosis of unspecified vein: Secondary | ICD-10-CM | POA: Diagnosis not present

## 2014-08-29 DIAGNOSIS — J111 Influenza due to unidentified influenza virus with other respiratory manifestations: Secondary | ICD-10-CM | POA: Diagnosis not present

## 2014-08-29 DIAGNOSIS — Z792 Long term (current) use of antibiotics: Secondary | ICD-10-CM | POA: Diagnosis not present

## 2014-08-29 DIAGNOSIS — B373 Candidiasis of vulva and vagina: Secondary | ICD-10-CM | POA: Diagnosis not present

## 2014-08-29 DIAGNOSIS — R112 Nausea with vomiting, unspecified: Secondary | ICD-10-CM | POA: Diagnosis not present

## 2014-08-29 DIAGNOSIS — R197 Diarrhea, unspecified: Secondary | ICD-10-CM | POA: Diagnosis not present

## 2014-08-29 DIAGNOSIS — Z794 Long term (current) use of insulin: Secondary | ICD-10-CM | POA: Diagnosis not present

## 2014-08-29 DIAGNOSIS — R531 Weakness: Secondary | ICD-10-CM | POA: Diagnosis not present

## 2014-08-29 DIAGNOSIS — L03116 Cellulitis of left lower limb: Secondary | ICD-10-CM | POA: Diagnosis not present

## 2014-08-29 DIAGNOSIS — R509 Fever, unspecified: Secondary | ICD-10-CM | POA: Diagnosis not present

## 2014-08-29 DIAGNOSIS — E1165 Type 2 diabetes mellitus with hyperglycemia: Secondary | ICD-10-CM | POA: Diagnosis not present

## 2014-08-29 DIAGNOSIS — R404 Transient alteration of awareness: Secondary | ICD-10-CM | POA: Diagnosis not present

## 2014-08-29 DIAGNOSIS — R05 Cough: Secondary | ICD-10-CM | POA: Diagnosis not present

## 2014-08-29 DIAGNOSIS — R079 Chest pain, unspecified: Secondary | ICD-10-CM | POA: Diagnosis not present

## 2014-09-05 DIAGNOSIS — E119 Type 2 diabetes mellitus without complications: Secondary | ICD-10-CM | POA: Diagnosis not present

## 2014-09-05 DIAGNOSIS — R112 Nausea with vomiting, unspecified: Secondary | ICD-10-CM | POA: Diagnosis not present

## 2014-09-05 DIAGNOSIS — R197 Diarrhea, unspecified: Secondary | ICD-10-CM | POA: Diagnosis not present

## 2014-09-05 DIAGNOSIS — J209 Acute bronchitis, unspecified: Secondary | ICD-10-CM | POA: Diagnosis not present

## 2015-01-24 DIAGNOSIS — Z794 Long term (current) use of insulin: Secondary | ICD-10-CM | POA: Diagnosis not present

## 2015-01-24 DIAGNOSIS — M549 Dorsalgia, unspecified: Secondary | ICD-10-CM | POA: Diagnosis present

## 2015-01-24 DIAGNOSIS — Z882 Allergy status to sulfonamides status: Secondary | ICD-10-CM | POA: Diagnosis not present

## 2015-01-24 DIAGNOSIS — E1310 Other specified diabetes mellitus with ketoacidosis without coma: Secondary | ICD-10-CM | POA: Diagnosis not present

## 2015-01-24 DIAGNOSIS — Z880 Allergy status to penicillin: Secondary | ICD-10-CM | POA: Diagnosis not present

## 2015-01-24 DIAGNOSIS — E1142 Type 2 diabetes mellitus with diabetic polyneuropathy: Secondary | ICD-10-CM | POA: Diagnosis present

## 2015-01-24 DIAGNOSIS — K589 Irritable bowel syndrome without diarrhea: Secondary | ICD-10-CM | POA: Diagnosis present

## 2015-01-24 DIAGNOSIS — E860 Dehydration: Secondary | ICD-10-CM | POA: Diagnosis present

## 2015-01-24 DIAGNOSIS — Z89511 Acquired absence of right leg below knee: Secondary | ICD-10-CM | POA: Diagnosis not present

## 2015-01-24 DIAGNOSIS — Z87891 Personal history of nicotine dependence: Secondary | ICD-10-CM | POA: Diagnosis not present

## 2015-01-24 DIAGNOSIS — E870 Hyperosmolality and hypernatremia: Secondary | ICD-10-CM | POA: Diagnosis not present

## 2015-01-24 DIAGNOSIS — Z888 Allergy status to other drugs, medicaments and biological substances status: Secondary | ICD-10-CM | POA: Diagnosis not present

## 2015-01-24 DIAGNOSIS — R45851 Suicidal ideations: Secondary | ICD-10-CM | POA: Diagnosis present

## 2015-01-24 DIAGNOSIS — Z79899 Other long term (current) drug therapy: Secondary | ICD-10-CM | POA: Diagnosis not present

## 2015-01-24 DIAGNOSIS — D72829 Elevated white blood cell count, unspecified: Secondary | ICD-10-CM | POA: Diagnosis not present

## 2015-01-24 DIAGNOSIS — M1990 Unspecified osteoarthritis, unspecified site: Secondary | ICD-10-CM | POA: Diagnosis present

## 2015-01-24 DIAGNOSIS — K219 Gastro-esophageal reflux disease without esophagitis: Secondary | ICD-10-CM | POA: Diagnosis present

## 2015-01-24 DIAGNOSIS — E1169 Type 2 diabetes mellitus with other specified complication: Secondary | ICD-10-CM | POA: Diagnosis not present

## 2015-01-24 DIAGNOSIS — R1013 Epigastric pain: Secondary | ICD-10-CM | POA: Diagnosis not present

## 2015-01-24 DIAGNOSIS — K3184 Gastroparesis: Secondary | ICD-10-CM | POA: Diagnosis not present

## 2015-01-24 DIAGNOSIS — M797 Fibromyalgia: Secondary | ICD-10-CM | POA: Diagnosis present

## 2015-01-24 DIAGNOSIS — R739 Hyperglycemia, unspecified: Secondary | ICD-10-CM | POA: Diagnosis not present

## 2015-01-24 DIAGNOSIS — R112 Nausea with vomiting, unspecified: Secondary | ICD-10-CM | POA: Diagnosis not present

## 2015-01-24 DIAGNOSIS — G8929 Other chronic pain: Secondary | ICD-10-CM | POA: Diagnosis present

## 2015-02-05 DIAGNOSIS — E119 Type 2 diabetes mellitus without complications: Secondary | ICD-10-CM | POA: Diagnosis not present

## 2015-02-05 DIAGNOSIS — E1310 Other specified diabetes mellitus with ketoacidosis without coma: Secondary | ICD-10-CM | POA: Diagnosis not present

## 2015-02-05 DIAGNOSIS — R1310 Dysphagia, unspecified: Secondary | ICD-10-CM | POA: Diagnosis not present

## 2015-02-05 DIAGNOSIS — K219 Gastro-esophageal reflux disease without esophagitis: Secondary | ICD-10-CM | POA: Diagnosis not present

## 2015-02-05 DIAGNOSIS — R109 Unspecified abdominal pain: Secondary | ICD-10-CM | POA: Diagnosis not present

## 2015-03-26 DIAGNOSIS — L304 Erythema intertrigo: Secondary | ICD-10-CM | POA: Diagnosis not present

## 2015-03-26 DIAGNOSIS — B372 Candidiasis of skin and nail: Secondary | ICD-10-CM | POA: Diagnosis not present

## 2015-03-26 DIAGNOSIS — E119 Type 2 diabetes mellitus without complications: Secondary | ICD-10-CM | POA: Diagnosis not present

## 2015-12-04 DIAGNOSIS — L97909 Non-pressure chronic ulcer of unspecified part of unspecified lower leg with unspecified severity: Secondary | ICD-10-CM | POA: Diagnosis not present

## 2015-12-04 DIAGNOSIS — I83009 Varicose veins of unspecified lower extremity with ulcer of unspecified site: Secondary | ICD-10-CM | POA: Diagnosis not present

## 2015-12-04 DIAGNOSIS — E1165 Type 2 diabetes mellitus with hyperglycemia: Secondary | ICD-10-CM | POA: Diagnosis not present

## 2015-12-04 DIAGNOSIS — E1140 Type 2 diabetes mellitus with diabetic neuropathy, unspecified: Secondary | ICD-10-CM | POA: Diagnosis not present

## 2015-12-15 DIAGNOSIS — I872 Venous insufficiency (chronic) (peripheral): Secondary | ICD-10-CM | POA: Diagnosis not present

## 2015-12-15 DIAGNOSIS — I87312 Chronic venous hypertension (idiopathic) with ulcer of left lower extremity: Secondary | ICD-10-CM | POA: Diagnosis not present

## 2015-12-15 DIAGNOSIS — L97821 Non-pressure chronic ulcer of other part of left lower leg limited to breakdown of skin: Secondary | ICD-10-CM | POA: Diagnosis not present

## 2015-12-15 DIAGNOSIS — I739 Peripheral vascular disease, unspecified: Secondary | ICD-10-CM | POA: Diagnosis not present

## 2015-12-15 DIAGNOSIS — E119 Type 2 diabetes mellitus without complications: Secondary | ICD-10-CM | POA: Diagnosis not present

## 2015-12-22 DIAGNOSIS — L97821 Non-pressure chronic ulcer of other part of left lower leg limited to breakdown of skin: Secondary | ICD-10-CM | POA: Diagnosis not present

## 2015-12-22 DIAGNOSIS — E11622 Type 2 diabetes mellitus with other skin ulcer: Secondary | ICD-10-CM | POA: Diagnosis not present

## 2015-12-22 DIAGNOSIS — I872 Venous insufficiency (chronic) (peripheral): Secondary | ICD-10-CM | POA: Diagnosis not present

## 2015-12-22 DIAGNOSIS — I87312 Chronic venous hypertension (idiopathic) with ulcer of left lower extremity: Secondary | ICD-10-CM | POA: Diagnosis not present

## 2015-12-22 DIAGNOSIS — L97222 Non-pressure chronic ulcer of left calf with fat layer exposed: Secondary | ICD-10-CM | POA: Diagnosis not present

## 2015-12-29 DIAGNOSIS — E119 Type 2 diabetes mellitus without complications: Secondary | ICD-10-CM | POA: Diagnosis not present

## 2015-12-29 DIAGNOSIS — Z89611 Acquired absence of right leg above knee: Secondary | ICD-10-CM | POA: Diagnosis not present

## 2015-12-29 DIAGNOSIS — I872 Venous insufficiency (chronic) (peripheral): Secondary | ICD-10-CM | POA: Diagnosis not present

## 2015-12-29 DIAGNOSIS — L97821 Non-pressure chronic ulcer of other part of left lower leg limited to breakdown of skin: Secondary | ICD-10-CM | POA: Diagnosis not present

## 2015-12-29 DIAGNOSIS — I87312 Chronic venous hypertension (idiopathic) with ulcer of left lower extremity: Secondary | ICD-10-CM | POA: Diagnosis not present

## 2017-02-14 DIAGNOSIS — I10 Essential (primary) hypertension: Secondary | ICD-10-CM | POA: Diagnosis not present

## 2017-02-14 DIAGNOSIS — B379 Candidiasis, unspecified: Secondary | ICD-10-CM | POA: Diagnosis not present

## 2017-02-14 DIAGNOSIS — E1165 Type 2 diabetes mellitus with hyperglycemia: Secondary | ICD-10-CM | POA: Diagnosis not present

## 2017-05-05 DIAGNOSIS — K921 Melena: Secondary | ICD-10-CM | POA: Diagnosis not present

## 2017-05-05 DIAGNOSIS — K5900 Constipation, unspecified: Secondary | ICD-10-CM | POA: Diagnosis not present

## 2017-05-05 DIAGNOSIS — E1165 Type 2 diabetes mellitus with hyperglycemia: Secondary | ICD-10-CM | POA: Diagnosis not present

## 2017-05-05 DIAGNOSIS — I10 Essential (primary) hypertension: Secondary | ICD-10-CM | POA: Diagnosis not present

## 2017-05-12 DIAGNOSIS — H2512 Age-related nuclear cataract, left eye: Secondary | ICD-10-CM | POA: Diagnosis not present

## 2017-06-08 DIAGNOSIS — I10 Essential (primary) hypertension: Secondary | ICD-10-CM | POA: Diagnosis not present

## 2017-06-08 DIAGNOSIS — E1165 Type 2 diabetes mellitus with hyperglycemia: Secondary | ICD-10-CM | POA: Diagnosis not present

## 2017-07-10 DIAGNOSIS — I10 Essential (primary) hypertension: Secondary | ICD-10-CM | POA: Diagnosis not present

## 2017-07-10 DIAGNOSIS — G4700 Insomnia, unspecified: Secondary | ICD-10-CM | POA: Diagnosis not present

## 2017-07-10 DIAGNOSIS — K219 Gastro-esophageal reflux disease without esophagitis: Secondary | ICD-10-CM | POA: Diagnosis not present

## 2017-07-10 DIAGNOSIS — E1165 Type 2 diabetes mellitus with hyperglycemia: Secondary | ICD-10-CM | POA: Diagnosis not present

## 2017-07-10 DIAGNOSIS — R197 Diarrhea, unspecified: Secondary | ICD-10-CM | POA: Diagnosis not present

## 2017-07-10 DIAGNOSIS — Z794 Long term (current) use of insulin: Secondary | ICD-10-CM | POA: Diagnosis not present

## 2017-08-10 DIAGNOSIS — H2512 Age-related nuclear cataract, left eye: Secondary | ICD-10-CM | POA: Diagnosis not present

## 2017-08-14 DIAGNOSIS — H2512 Age-related nuclear cataract, left eye: Secondary | ICD-10-CM | POA: Diagnosis not present

## 2017-08-16 ENCOUNTER — Encounter: Payer: Self-pay | Admitting: *Deleted

## 2017-08-17 ENCOUNTER — Ambulatory Visit
Admission: RE | Admit: 2017-08-17 | Discharge: 2017-08-17 | Disposition: A | Discharge status: Home / Self Care | Payer: Medicare Other | Source: Ambulatory Visit | Attending: Ophthalmology | Admitting: Ophthalmology

## 2017-08-17 ENCOUNTER — Ambulatory Visit: Payer: Medicare Other | Admitting: Registered Nurse

## 2017-08-17 ENCOUNTER — Encounter
Admission: RE | Disposition: A | Discharge status: Home / Self Care | Payer: Self-pay | Source: Ambulatory Visit | Attending: Ophthalmology

## 2017-08-17 ENCOUNTER — Encounter: Payer: Self-pay | Admitting: Emergency Medicine

## 2017-08-17 DIAGNOSIS — E11610 Type 2 diabetes mellitus with diabetic neuropathic arthropathy: Secondary | ICD-10-CM | POA: Insufficient documentation

## 2017-08-17 DIAGNOSIS — Z87891 Personal history of nicotine dependence: Secondary | ICD-10-CM | POA: Diagnosis not present

## 2017-08-17 DIAGNOSIS — K219 Gastro-esophageal reflux disease without esophagitis: Secondary | ICD-10-CM | POA: Diagnosis not present

## 2017-08-17 DIAGNOSIS — F329 Major depressive disorder, single episode, unspecified: Secondary | ICD-10-CM | POA: Diagnosis not present

## 2017-08-17 DIAGNOSIS — Z89511 Acquired absence of right leg below knee: Secondary | ICD-10-CM | POA: Insufficient documentation

## 2017-08-17 DIAGNOSIS — Z794 Long term (current) use of insulin: Secondary | ICD-10-CM | POA: Insufficient documentation

## 2017-08-17 DIAGNOSIS — Z79899 Other long term (current) drug therapy: Secondary | ICD-10-CM | POA: Diagnosis not present

## 2017-08-17 DIAGNOSIS — I10 Essential (primary) hypertension: Secondary | ICD-10-CM | POA: Insufficient documentation

## 2017-08-17 DIAGNOSIS — E1140 Type 2 diabetes mellitus with diabetic neuropathy, unspecified: Secondary | ICD-10-CM | POA: Diagnosis not present

## 2017-08-17 DIAGNOSIS — H2512 Age-related nuclear cataract, left eye: Secondary | ICD-10-CM | POA: Diagnosis not present

## 2017-08-17 DIAGNOSIS — E1151 Type 2 diabetes mellitus with diabetic peripheral angiopathy without gangrene: Secondary | ICD-10-CM | POA: Diagnosis not present

## 2017-08-17 HISTORY — PX: CATARACT EXTRACTION W/PHACO: SHX586

## 2017-08-17 LAB — GLUCOSE, CAPILLARY
GLUCOSE-CAPILLARY: 277 mg/dL — AB (ref 65–99)
GLUCOSE-CAPILLARY: 259 mg/dL — AB (ref 65–99)
Glucose-Capillary: 306 mg/dL — ABNORMAL HIGH (ref 65–99)

## 2017-08-17 SURGERY — PHACOEMULSIFICATION, CATARACT, WITH IOL INSERTION
Anesthesia: General | Site: Eye | Laterality: Left | Wound class: Clean

## 2017-08-17 MED ORDER — CARBACHOL 0.01 % IO SOLN
INTRAOCULAR | Status: DC | PRN
  Administered 2017-08-17: 0.5 mL via INTRAOCULAR

## 2017-08-17 MED ORDER — CYCLOPENTOLATE HCL 2 % OP SOLN
OPHTHALMIC | Status: AC
Start: 2017-08-17 — End: 2017-08-17
  Administered 2017-08-17: 1 [drp] via OPHTHALMIC
  Filled 2017-08-17: qty 2

## 2017-08-17 MED ORDER — INSULIN ASPART 100 UNIT/ML ~~LOC~~ SOLN
SUBCUTANEOUS | Status: AC
  Filled 2017-08-17: qty 1

## 2017-08-17 MED ORDER — INSULIN ASPART 100 UNIT/ML ~~LOC~~ SOLN
5.0000 [IU] | Freq: Once | SUBCUTANEOUS | Status: AC
  Administered 2017-08-17: 5 [IU] via SUBCUTANEOUS

## 2017-08-17 MED ORDER — ACETAMINOPHEN 325 MG PO TABS: 650.0000 mg | ORAL_TABLET | Freq: Four times a day (QID) | ORAL | Status: DC | PRN

## 2017-08-17 MED ORDER — NEOMYCIN-POLYMYXIN-DEXAMETH 0.1 % OP OINT
TOPICAL_OINTMENT | OPHTHALMIC | Status: DC | PRN
Start: 2017-08-17 — End: 2017-08-17
  Administered 2017-08-17: 1 via OPHTHALMIC

## 2017-08-17 MED ORDER — PROPOFOL 10 MG/ML IV BOLUS
INTRAVENOUS | Status: DC | PRN
  Administered 2017-08-17: 160 mg via INTRAVENOUS
  Administered 2017-08-17: 40 mg via INTRAVENOUS

## 2017-08-17 MED ORDER — SODIUM CHLORIDE 0.9 % IV SOLN
INTRAVENOUS | Status: DC
  Administered 2017-08-17: 08:00:00 via INTRAVENOUS

## 2017-08-17 MED ORDER — NA HYALUR & NA CHOND-NA HYALUR 0.55-0.5 ML IO KIT
PACK | INTRAOCULAR | Status: AC
  Filled 2017-08-17: qty 1.05

## 2017-08-17 MED ORDER — LIDOCAINE HCL (PF) 2 % IJ SOLN
INTRAMUSCULAR | Status: AC
  Filled 2017-08-17: qty 10

## 2017-08-17 MED ORDER — EPINEPHRINE PF 1 MG/ML IJ SOLN
INTRAOCULAR | Status: DC | PRN
  Administered 2017-08-17: 09:00:00 via OPHTHALMIC

## 2017-08-17 MED ORDER — MOXIFLOXACIN HCL 0.5 % OP SOLN
1.0000 [drp] | OPHTHALMIC | Status: AC
  Administered 2017-08-17 (×3): 1 [drp] via OPHTHALMIC

## 2017-08-17 MED ORDER — NEOMYCIN-POLYMYXIN-DEXAMETH 3.5-10000-0.1 OP OINT
TOPICAL_OINTMENT | OPHTHALMIC | Status: AC
  Filled 2017-08-17: qty 3.5

## 2017-08-17 MED ORDER — ACETAMINOPHEN 325 MG PO TABS
ORAL_TABLET | ORAL | Status: AC
  Administered 2017-08-17: 650 mg
  Filled 2017-08-17: qty 1

## 2017-08-17 MED ORDER — FENTANYL CITRATE (PF) 100 MCG/2ML IJ SOLN
INTRAMUSCULAR | Status: AC
  Filled 2017-08-17: qty 2

## 2017-08-17 MED ORDER — MOXIFLOXACIN HCL 0.5 % OP SOLN
OPHTHALMIC | Status: AC
  Administered 2017-08-17: 1 [drp] via OPHTHALMIC
  Filled 2017-08-17: qty 6

## 2017-08-17 MED ORDER — PROPOFOL 10 MG/ML IV BOLUS
INTRAVENOUS | Status: AC
  Filled 2017-08-17: qty 20

## 2017-08-17 MED ORDER — INSULIN ASPART 100 UNIT/ML ~~LOC~~ SOLN
SUBCUTANEOUS | Status: AC
  Administered 2017-08-17: 5 [IU] via SUBCUTANEOUS
  Filled 2017-08-17: qty 1

## 2017-08-17 MED ORDER — PHENYLEPHRINE HCL 10 % OP SOLN
1.0000 [drp] | OPHTHALMIC | Status: AC
  Administered 2017-08-17 (×4): 1 [drp] via OPHTHALMIC

## 2017-08-17 MED ORDER — FENTANYL CITRATE (PF) 100 MCG/2ML IJ SOLN: 25.0000 ug | INTRAMUSCULAR | Status: DC | PRN

## 2017-08-17 MED ORDER — ARMC OPHTHALMIC DILATING DROPS
OPHTHALMIC | Status: AC
  Filled 2017-08-17: qty 0.4

## 2017-08-17 MED ORDER — MOXIFLOXACIN HCL 0.5 % OP SOLN
OPHTHALMIC | Status: DC | PRN
  Administered 2017-08-17: 0.2 mL via OPHTHALMIC

## 2017-08-17 MED ORDER — SUCCINYLCHOLINE CHLORIDE 20 MG/ML IJ SOLN
INTRAMUSCULAR | Status: DC | PRN
  Administered 2017-08-17: 100 mg via INTRAVENOUS

## 2017-08-17 MED ORDER — LIDOCAINE HCL (PF) 4 % IJ SOLN
INTRAMUSCULAR | Status: AC
Start: 2017-08-17 — End: 2017-08-17
  Filled 2017-08-17: qty 5

## 2017-08-17 MED ORDER — PHENYLEPHRINE HCL 10 % OP SOLN
OPHTHALMIC | Status: AC
  Administered 2017-08-17: 1 [drp] via OPHTHALMIC
  Filled 2017-08-17: qty 5

## 2017-08-17 MED ORDER — ONDANSETRON HCL 4 MG/2ML IJ SOLN
INTRAMUSCULAR | Status: DC | PRN
  Administered 2017-08-17: 4 mg via INTRAVENOUS

## 2017-08-17 MED ORDER — PHENYLEPHRINE HCL 10 MG/ML IJ SOLN
INTRAMUSCULAR | Status: DC | PRN
  Administered 2017-08-17: 100 ug via INTRAVENOUS

## 2017-08-17 MED ORDER — EPINEPHRINE PF 1 MG/ML IJ SOLN
INTRAMUSCULAR | Status: AC
  Filled 2017-08-17: qty 2

## 2017-08-17 MED ORDER — CYCLOPENTOLATE HCL 2 % OP SOLN
1.0000 [drp] | OPHTHALMIC | Status: AC
  Administered 2017-08-17 (×4): 1 [drp] via OPHTHALMIC

## 2017-08-17 MED ORDER — INSULIN REGULAR HUMAN 100 UNIT/ML IJ SOLN: 5.0000 [IU] | Freq: Once | INTRAMUSCULAR | Status: DC

## 2017-08-17 MED ORDER — FENTANYL CITRATE (PF) 100 MCG/2ML IJ SOLN
INTRAMUSCULAR | Status: DC | PRN
  Administered 2017-08-17: 50 ug via INTRAVENOUS

## 2017-08-17 MED ORDER — NA HYALUR & NA CHOND-NA HYALUR 0.4-0.35 ML IO KIT
PACK | INTRAOCULAR | Status: DC | PRN
  Administered 2017-08-17: .35 mL via INTRAOCULAR

## 2017-08-17 MED ORDER — POVIDONE-IODINE 5 % OP SOLN
OPHTHALMIC | Status: DC | PRN
  Administered 2017-08-17: 1 via OPHTHALMIC

## 2017-08-17 MED ORDER — POVIDONE-IODINE 5 % OP SOLN
OPHTHALMIC | Status: AC
  Filled 2017-08-17: qty 30

## 2017-08-17 SURGICAL SUPPLY — 16 items
GLOVE BIO SURGEON STRL SZ8 (GLOVE) ×3 IMPLANT
GLOVE BIOGEL M 6.5 STRL (GLOVE) ×3 IMPLANT
GLOVE SURG LX 7.5 STRW (GLOVE) ×2
GLOVE SURG LX STRL 7.5 STRW (GLOVE) ×1 IMPLANT
GOWN STRL REUS W/ TWL LRG LVL3 (GOWN DISPOSABLE) ×2 IMPLANT
GOWN STRL REUS W/TWL LRG LVL3 (GOWN DISPOSABLE) ×6
LABEL CATARACT MEDS ST (LABEL) ×3 IMPLANT
LENS IOL TECNIS ITEC 20.5 (Intraocular Lens) ×2 IMPLANT
PACK CATARACT (MISCELLANEOUS) ×3 IMPLANT
PACK CATARACT BRASINGTON LX (MISCELLANEOUS) ×3 IMPLANT
PACK EYE AFTER SURG (MISCELLANEOUS) ×3 IMPLANT
SOL BSS BAG (MISCELLANEOUS) ×3
SOLUTION BSS BAG (MISCELLANEOUS) ×1 IMPLANT
SYR 5ML LL (SYRINGE) ×3 IMPLANT
WATER STERILE IRR 250ML POUR (IV SOLUTION) ×3 IMPLANT
WIPE NON LINTING 3.25X3.25 (MISCELLANEOUS) ×3 IMPLANT

## 2017-08-17 NOTE — Discharge Instructions (Signed)
Eye Surgery Discharge Instructions  Expect mild scratchy sensation or mild soreness. DO NOT RUB YOUR EYE!  The day of surgery:  Minimal physical activity, but bed rest is not required  No reading, computer work, or close hand work  No bending, lifting, or straining.  May watch TV  For 24 hours:  No driving, legal decisions, or alcoholic beverages  Safety precautions  Eat anything you prefer: It is better to start with liquids, then soup then solid foods.  _____ Eye patch should be worn until postoperative exam tomorrow.  ____ Solar shield eyeglasses should be worn for comfort in the sunlight/patch while sleeping  Resume all regular medications including aspirin or Coumadin if these were discontinued prior to surgery. You may shower, bathe, shave, or wash your hair. Tylenol may be taken for mild discomfort.  Call your doctor if you experience significant pain, nausea, or vomiting, fever > 101 or other signs of infection. [phone] or [phone] Specific instructions:  Follow-up Information    Lockie MolaBrasington, Chadwick, MD Follow up on 08/18/2017.   Specialty:  Ophthalmology Why:  10:15 Contact information: [address]   [place] [address] [phone]

## 2017-08-17 NOTE — Transfer of Care (Signed)
Immediate Anesthesia Transfer of Care Note  Patient: [name] [name]  Procedure(s) Performed: CATARACT EXTRACTION PHACO AND INTRAOCULAR LENS PLACEMENT (IOC) (Left Eye)  Patient Location: PACU  Anesthesia Type:General  Level of Consciousness: sedated  Airway & Oxygen Therapy: Patient Spontanous Breathing and Patient connected to nasal cannula oxygen  Post-op Assessment: Report given to RN and Post -op Vital signs reviewed and stable  Post vital signs: Reviewed and stable  Last Vitals:  Vitals:   08/17/17 0730 08/17/17 0917  BP: (!) 196/84 121/62  Pulse: 94 83  Resp: 17 (!) 26  Temp:  (!) 36.1 C  SpO2: 97% 98%    Last Pain:  Vitals:   08/17/17 0730  TempSrc: Temporal         Complications: No apparent anesthesia complications

## 2017-08-17 NOTE — Progress Notes (Signed)
Eyes watering

## 2017-08-17 NOTE — Op Note (Signed)
OPERATIVE NOTE  [name] [name] [id number] 08/17/2017   PREOPERATIVE DIAGNOSIS:  Nuclear sclerotic cataract left eye. H25.12   POSTOPERATIVE DIAGNOSIS:    Nuclear sclerotic cataract left eye.     PROCEDURE:  Phacoemusification with posterior chamber intraocular lens placement of the left eye   LENS:   Implant Name Type Inv. Item Serial No. Manufacturer Lot No. LRB No. Used  LENS IOL DIOP 20.5 - [id number] Intraocular Lens LENS IOL DIOP 20.5 [id number]  Left 1        ULTRASOUND TIME: 13 % of 1 minutes, 36 seconds.  CDE 12.9   SURGEON:  Deirdre Evenerhadwick R. Rawleigh Rode, MD   ANESTHESIA:  General   COMPLICATIONS:  None.   DESCRIPTION OF PROCEDURE:  The patient was identified in the holding room and transported to the operating room and placed in the supine position under the operating microscope.  The left eye was identified as the operative eye and it was prepped and draped in the usual sterile ophthalmic fashion.   A 1 millimeter clear-corneal paracentesis was made at the 1:30 position. 0.5 ml of preservative-free 1% lidocaine was injected into the anterior chamber.  The anterior chamber was filled with Viscoat viscoelastic.  A 2.4 millimeter keratome was used to make a near-clear corneal incision at the 10:30 position.  .  A curvilinear capsulorrhexis was made with a cystotome and capsulorrhexis forceps.  Balanced salt solution was used to hydrodissect and hydrodelineate the nucleus.   Phacoemulsification was then used in stop and chop fashion to remove the lens nucleus and epinucleus.  The remaining cortex was then removed using the irrigation and aspiration handpiece. Provisc was then placed into the capsular bag to distend it for lens placement.  A lens was then injected into the capsular bag.  The remaining viscoelastic was aspirated.   Wounds were hydrated with balanced salt solution.  The anterior chamber was inflated to a physiologic pressure with balanced salt solution. Vigamox  0.2 ml of a 1mg  per ml solution was injected into the anterior chamber for a dose of 0.2 mg of intracameral antibiotic at the completion of the case.  Miostat was placed into the anterior chamber to constrict the pupil.  No wound leaks were noted.  Topical Maxitrol ointment was applied to the eye.  The patient was awakened and taken to the recovery room in stable condition without complications of anesthesia or surgery  [name] 08/17/2017, 9:11 AM

## 2017-08-17 NOTE — OR Nursing (Signed)
fsbs 259 - Dr. Randa NgoPiscitello notified of same, advises ok and instructed pt to check again when she gets home per anesthesia - advises she will do.

## 2017-08-17 NOTE — Anesthesia Postprocedure Evaluation (Signed)
Anesthesia Post Note  Patient: [name] centre managerBridgette [name]  Procedure(s) Performed: CATARACT EXTRACTION PHACO AND INTRAOCULAR LENS PLACEMENT (IOC) (Left Eye)  Patient location during evaluation: PACU Anesthesia Type: General Level of consciousness: awake and alert Pain management: pain level controlled Vital Signs Assessment: post-procedure vital signs reviewed and stable Respiratory status: spontaneous breathing, nonlabored ventilation, respiratory function stable and patient connected to nasal cannula oxygen Cardiovascular status: blood pressure returned to baseline and stable Postop Assessment: no apparent nausea or vomiting Anesthetic complications: no Comments: Patient with elevate serum glucose today, treated pre and post Op.  Patient and caregivers instructed that she should continue to monitor and treat her blood sugar once she is discharged.  They voiced understanding.     Last Vitals:  Vitals:   08/17/17 1014 08/17/17 1031  BP: 139/60 (!) 132/58  Pulse: 88 86  Resp: 16 16  Temp: (!) 36.2 C   SpO2: 100% 95%    Last Pain:  Vitals:   08/17/17 1031  TempSrc:   PainSc: 5                  Cleda MccreedyJoseph K Samiksha Pellicano

## 2017-08-17 NOTE — H&P (Signed)
The History and Physical notes are on paper, have been signed, and are to be scanned. The patient remains stable and unchanged from the H&P.   Previous H&P reviewed, patient examined, and there are no changes.  [name] [name] 08/17/2017 7:33 AM

## 2017-08-17 NOTE — Anesthesia Preprocedure Evaluation (Addendum)
Anesthesia Evaluation  Patient identified by MRN, date of birth, ID band Patient awake    Reviewed: Allergy & Precautions, H&P , NPO status , Patient's Chart, lab work & pertinent test results  History of Anesthesia Complications (+) PONV and history of anesthetic complications  Airway Mallampati: III  TM Distance: >3 FB Neck ROM: limited    Dental  (+) Missing, Edentulous Upper, Edentulous Lower, Upper Dentures, Lower Dentures   Pulmonary neg shortness of breath, former smoker,           Cardiovascular Exercise Tolerance: Good hypertension, (-) angina+ Peripheral Vascular Disease  (-) Past MI and (-) DOE      Neuro/Psych  Headaches, PSYCHIATRIC DISORDERS Depression  Neuromuscular disease    GI/Hepatic Neg liver ROS, GERD  ,  Endo/Other  diabetes, Type 2  Renal/GU      Musculoskeletal  (+) Arthritis , Fibromyalgia -  Abdominal   Peds  Hematology negative hematology ROS (+)   Anesthesia Other Findings Past Medical History: No date: Allergy     Comment:  sinus problems No date: Arthritis     Comment:  "both hands"  No date: Charcot's joint of foot     Comment:  "both feet"  No date: Chronic lower back pain     Comment:  "2 cracked vertebra; 3 herniated discs" (05/09/2013) No date: Complication of anesthesia     Comment:  "[name]" (05/09/2013) No date: Depression No date: Dizziness No date: Fatigue No date: Fibromyalgia No date: GERD (gastroesophageal reflux disease) No date: IBS (irritable bowel syndrome) No date: Migraines     Comment:  "hits me here and there" (05/09/2013) No date: Neuromuscular disorder (HCC) No date: PONV (postoperative nausea and vomiting) No date: Poor circulation No date: Swelling No date: Type II diabetes mellitus (HCC) No date: Ulcer No date: Varicose vein of leg  Past Surgical History: 05/11/2013: AMPUTATION; Right     Comment:  Procedure: AMPUTATION  BELOW KNEE;  Surgeon: Nadara Mustard, MD;  Location: [facility] OR;  Service: Orthopedics;                Laterality: Right; No date: BUNIONECTOMY; Right 1980; 1996: CESAREAN SECTION No date: CHOLECYSTECTOMY No date: EYE SURGERY; Right     Comment:  ENUCLEATION ~ 2012: RETINAL DETACHMENT SURGERY; Left 1996: TUBAL LIGATION ~ 2003: VARICOSE VEIN SURGERY; Left     Comment:  "it was full of blood clots" (05/09/2013)  BMI    Body Mass Index:  31.32 kg/m      Reproductive/Obstetrics negative OB ROS                             Anesthesia Physical Anesthesia Plan  ASA: III  Anesthesia Plan: General ETT   Post-op Pain Management:    Induction: Intravenous  PONV Risk Score and Plan: Ondansetron, Dexamethasone and Midazolam  Airway Management Planned: Oral ETT  Additional Equipment:   Intra-op Plan:   Post-operative Plan: Extubation in OR  Informed Consent: I have reviewed the patients History and Physical, chart, labs and discussed the procedure including the risks, benefits and alternatives for the proposed anesthesia with the patient or authorized representative who has indicated his/her understanding and acceptance.   Dental Advisory Given  Plan Discussed with: Anesthesiologist, CRNA and Surgeon  Anesthesia Plan Comments: (Patient reports that she is allergic to  all numbing medicines.  She declined the idea of having allergy testing so plan for GA.  Patient consented for risks of anesthesia including but not limited to:  - adverse reactions to medications - damage to teeth, lips or other oral mucosa - sore throat or hoarseness - Damage to heart, brain, lungs or loss of life  Patient voiced understanding.)       Anesthesia Quick Evaluation

## 2017-08-17 NOTE — Anesthesia Post-op Follow-up Note (Signed)
Anesthesia QCDR form completed.        

## 2017-08-17 NOTE — Anesthesia Procedure Notes (Signed)
Procedure Name: Intubation Date/Time: 08/17/2017 8:40 AM Performed by: Hedda Slade, CRNA Pre-anesthesia Checklist: Patient identified, Patient being monitored, Timeout performed, Emergency Drugs available and Suction available Patient Re-evaluated:Patient Re-evaluated prior to induction Oxygen Delivery Method: Circle system utilized Preoxygenation: Pre-oxygenation with 100% oxygen Induction Type: IV induction Ventilation: Mask ventilation without difficulty Laryngoscope Size: Mac and 3 Grade View: Grade I Tube type: Oral Tube size: 7.0 mm Number of attempts: 1 Airway Equipment and Method: Stylet Placement Confirmation: ETT inserted through vocal cords under direct vision,  positive ETCO2 and breath sounds checked- equal and bilateral Secured at: 21 cm Tube secured with: Tape Dental Injury: Teeth and Oropharynx as per pre-operative assessment

## 2017-10-04 DIAGNOSIS — E113512 Type 2 diabetes mellitus with proliferative diabetic retinopathy with macular edema, left eye: Secondary | ICD-10-CM | POA: Diagnosis not present

## 2018-03-27 DIAGNOSIS — Z79899 Other long term (current) drug therapy: Secondary | ICD-10-CM | POA: Diagnosis not present

## 2018-03-27 DIAGNOSIS — R252 Cramp and spasm: Secondary | ICD-10-CM | POA: Diagnosis not present

## 2018-03-27 DIAGNOSIS — Z008 Encounter for other general examination: Secondary | ICD-10-CM | POA: Diagnosis not present

## 2018-03-27 DIAGNOSIS — E119 Type 2 diabetes mellitus without complications: Secondary | ICD-10-CM | POA: Diagnosis not present

## 2018-03-27 DIAGNOSIS — R600 Localized edema: Secondary | ICD-10-CM | POA: Diagnosis not present

## 2018-04-10 ENCOUNTER — Telehealth (HOSPITAL_COMMUNITY): Payer: Self-pay | Admitting: Psychiatry

## 2018-04-16 ENCOUNTER — Emergency Department (HOSPITAL_COMMUNITY): Payer: Medicare Other

## 2018-04-16 ENCOUNTER — Emergency Department (HOSPITAL_COMMUNITY)
Admission: EM | Admit: 2018-04-16 | Discharge: 2018-04-17 | Disposition: A | Discharge status: Home / Self Care | Payer: Medicare Other | Attending: Emergency Medicine | Admitting: Emergency Medicine

## 2018-04-16 ENCOUNTER — Encounter (HOSPITAL_COMMUNITY): Payer: Self-pay | Admitting: Emergency Medicine

## 2018-04-16 DIAGNOSIS — Z79899 Other long term (current) drug therapy: Secondary | ICD-10-CM | POA: Insufficient documentation

## 2018-04-16 DIAGNOSIS — Z87891 Personal history of nicotine dependence: Secondary | ICD-10-CM | POA: Insufficient documentation

## 2018-04-16 DIAGNOSIS — K5900 Constipation, unspecified: Secondary | ICD-10-CM | POA: Diagnosis not present

## 2018-04-16 DIAGNOSIS — R1084 Generalized abdominal pain: Secondary | ICD-10-CM | POA: Diagnosis not present

## 2018-04-16 DIAGNOSIS — R112 Nausea with vomiting, unspecified: Secondary | ICD-10-CM | POA: Diagnosis not present

## 2018-04-16 DIAGNOSIS — Z7982 Long term (current) use of aspirin: Secondary | ICD-10-CM | POA: Insufficient documentation

## 2018-04-16 DIAGNOSIS — I10 Essential (primary) hypertension: Secondary | ICD-10-CM | POA: Diagnosis not present

## 2018-04-16 DIAGNOSIS — R1110 Vomiting, unspecified: Secondary | ICD-10-CM | POA: Diagnosis not present

## 2018-04-16 DIAGNOSIS — E1165 Type 2 diabetes mellitus with hyperglycemia: Secondary | ICD-10-CM | POA: Diagnosis not present

## 2018-04-16 DIAGNOSIS — E119 Type 2 diabetes mellitus without complications: Secondary | ICD-10-CM | POA: Insufficient documentation

## 2018-04-16 DIAGNOSIS — Z794 Long term (current) use of insulin: Secondary | ICD-10-CM | POA: Insufficient documentation

## 2018-04-16 DIAGNOSIS — R109 Unspecified abdominal pain: Secondary | ICD-10-CM

## 2018-04-16 DIAGNOSIS — R110 Nausea: Secondary | ICD-10-CM | POA: Diagnosis not present

## 2018-04-16 DIAGNOSIS — R140 Abdominal distension (gaseous): Secondary | ICD-10-CM | POA: Diagnosis not present

## 2018-04-16 LAB — COMPREHENSIVE METABOLIC PANEL
ALT: 30 U/L (ref 0–44)
AST: 34 U/L (ref 15–41)
Albumin: 3.1 g/dL — ABNORMAL LOW (ref 3.5–5.0)
Alkaline Phosphatase: 108 U/L (ref 38–126)
Anion gap: 9 (ref 5–15)
BUN: 11 mg/dL (ref 8–23)
CHLORIDE: 104 mmol/L (ref 98–111)
CO2: 24 mmol/L (ref 22–32)
Calcium: 8.8 mg/dL — ABNORMAL LOW (ref 8.9–10.3)
Creatinine, Ser: 0.78 mg/dL (ref 0.44–1.00)
GFR calc Af Amer: >60 mL/min (ref >60)
GFR calc non Af Amer: >60 mL/min (ref >60)
GLUCOSE: 271 mg/dL — AB (ref 70–99)
POTASSIUM: 4 mmol/L (ref 3.5–5.1)
SODIUM: 137 mmol/L (ref 135–145)
Total Bilirubin: 1 mg/dL (ref 0.3–1.2)
Total Protein: 6.3 g/dL — ABNORMAL LOW (ref 6.5–8.1)

## 2018-04-16 LAB — CBC WITH DIFFERENTIAL/PLATELET
Abs Immature Granulocytes: 0.02 10*3/uL (ref 0.00–0.07)
BASOS PCT: 0 %
Basophils Absolute: 0 10*3/uL (ref 0.0–0.1)
EOS ABS: 0 10*3/uL (ref 0.0–0.5)
Eosinophils Relative: 0 %
HCT: 47.4 % — ABNORMAL HIGH (ref 36.0–46.0)
Hemoglobin: 15.1 g/dL — ABNORMAL HIGH (ref 12.0–15.0)
IMMATURE GRANULOCYTES: 0 %
Lymphocytes Relative: 12 %
Lymphs Abs: 1.3 10*3/uL (ref 0.7–4.0)
MCH: 28.2 pg (ref 26.0–34.0)
MCHC: 31.9 g/dL (ref 30.0–36.0)
MCV: 88.4 fL (ref 80.0–100.0)
Monocytes Absolute: 0.4 10*3/uL (ref 0.1–1.0)
Monocytes Relative: 4 %
NEUTROS PCT: 84 %
NRBC: 0 % (ref 0.0–0.2)
Neutro Abs: 8.8 10*3/uL — ABNORMAL HIGH (ref 1.7–7.7)
PLATELETS: 203 10*3/uL (ref 150–400)
RBC: 5.36 MIL/uL — ABNORMAL HIGH (ref 3.87–5.11)
RDW: 13.2 % (ref 11.5–15.5)
WBC: 10.5 10*3/uL (ref 4.0–10.5)

## 2018-04-16 LAB — LIPASE, BLOOD: Lipase: 36 U/L (ref 11–51)

## 2018-04-16 LAB — I-STAT CG4 LACTIC ACID, ED: LACTIC ACID, VENOUS: 1.66 mmol/L (ref 0.5–1.9)

## 2018-04-16 LAB — PROTIME-INR
INR: 0.98
Prothrombin Time: 12.9 seconds (ref 11.4–15.2)

## 2018-04-16 LAB — AMMONIA: AMMONIA: 16 umol/L (ref 9–35)

## 2018-04-16 LAB — CBG MONITORING, ED: Glucose-Capillary: 270 mg/dL — ABNORMAL HIGH (ref 70–99)

## 2018-04-16 MED ORDER — MORPHINE SULFATE (PF) 4 MG/ML IV SOLN
4.0000 mg | Freq: Once | INTRAVENOUS | Status: AC
Start: 2018-04-16 — End: 2018-04-16
  Administered 2018-04-16: 4 mg via INTRAVENOUS
  Filled 2018-04-16: qty 1

## 2018-04-16 MED ORDER — ONDANSETRON HCL 4 MG/2ML IJ SOLN
4.0000 mg | Freq: Once | INTRAMUSCULAR | Status: AC
  Administered 2018-04-16: 4 mg via INTRAVENOUS
  Filled 2018-04-16: qty 2

## 2018-04-16 NOTE — ED Triage Notes (Signed)
Patient arrived via EMS from home related to Nausea and vomiting. Patient reports that she vomited about 20 times and believes it ws because of her newly prescribed meds. Patient is A&O X4, uses wheelchair at baseline, hx of Below knee amputation.  EDP at bedside

## 2018-04-16 NOTE — ED Notes (Signed)
Purewick in place and is hooked up to suction.

## 2018-04-16 NOTE — ED Notes (Signed)
Pt CBG 270. Notified, Al Decant, RN

## 2018-04-16 NOTE — ED Notes (Signed)
Patient transported to X-ray 

## 2018-04-16 NOTE — ED Provider Notes (Signed)
[facility] EMERGENCY DEPARTMENT Provider Note   CSN: [id number] Arrival date & time: 04/16/18  1745     History   Chief Complaint No chief complaint on file.   HPI [name] [name] is a 62 y.o. female.  The history is provided by the patient and medical records. No language interpreter was used.  Abdominal Pain   This is a new problem. The current episode started more than 2 days ago. The problem occurs constantly. The problem has been rapidly worsening. The pain is associated with an unknown factor. The pain is located in the generalized abdominal region. The quality of the pain is aching, sharp and pressure-like. The pain is at a severity of 10/10. The pain is severe. Associated symptoms include diarrhea (last week), nausea, vomiting, constipation and dysuria. Pertinent negatives include fever, frequency and headaches. The symptoms are aggravated by palpation and eating. Nothing relieves the symptoms. Her past medical history is significant for GERD and irritable bowel syndrome.    Past Medical History:  Diagnosis Date  . Allergy    sinus problems  . Arthritis    "both hands"   . Charcot's joint of foot    "both feet"   . Chronic lower back pain    "2 cracked vertebra; 3 herniated discs" (05/09/2013)  . Complication of anesthesia    "[name]" (05/09/2013)  . Depression   . Dizziness   . Fatigue   . Fibromyalgia   . GERD (gastroesophageal reflux disease)   . IBS (irritable bowel syndrome)   . Migraines    "hits me here and there" (05/09/2013)  . Neuromuscular disorder (HCC)   . PONV (postoperative nausea and vomiting)   . Poor circulation   . Swelling   . Type II diabetes mellitus (HCC)   . Ulcer   . Varicose vein of leg     Patient Active Problem List   Diagnosis Date Noted  . Hx of BKA (HCC) 05/13/2013  . Diabetic foot infection (HCC) 05/09/2013  . Sepsis (HCC) 05/09/2013  . Accelerated hypertension 05/09/2013  .  Depression 05/09/2013    Past Surgical History:  Procedure Laterality Date  . AMPUTATION Right 05/11/2013   Procedure: AMPUTATION BELOW KNEE;  Surgeon: Nadara Mustard, MD;  Location: [facility] OR;  Service: Orthopedics;  Laterality: Right;  . BUNIONECTOMY Right   . CATARACT EXTRACTION W/PHACO Left 08/17/2017   Procedure: CATARACT EXTRACTION PHACO AND INTRAOCULAR LENS PLACEMENT (IOC);  Surgeon: Lockie Mola, MD;  Location: [facility] ORS;  Service: Ophthalmology;  Laterality: Left;  [place] 01:36.1 AP% 13.4 CDE 12.86 Fluid Pack Lot # [id number]  . CESAREAN SECTION  1980; 1996  . CHOLECYSTECTOMY    . EYE SURGERY Right    ENUCLEATION  . RETINAL DETACHMENT SURGERY Left ~ 2012  . TUBAL LIGATION  1996  . VARICOSE VEIN SURGERY Left ~ 2003   "it was full of blood clots" (05/09/2013)     OB History   None      Home Medications    Prior to Admission medications   Medication Sig Start Date End Date Taking? Authorizing Provider  acetaminophen (TYLENOL) 650 MG CR tablet Take 1,300 mg by mouth every 6 (six) hours as needed for pain.    [provider]  aspirin 81 MG tablet Take 4 tablets (324 mg total) by mouth daily. Patient taking differently: Take 81-324 mg by mouth See admin instructions. Take 81 mg by mouth daily. May take 324 mg if  needed for pain 05/24/13   Angiulli, Mcarthur Rossetti, PA-C  baclofen (LIORESAL) 10 MG tablet Take 1 tablet (10 mg total) by mouth at bedtime. Patient not taking: Reported on 08/11/2017 05/24/13   Angiulli, Mcarthur Rossetti, PA-C  Carboxymethylcellul-Glycerin (LUBRICATING EYE DROPS OP) Place 1-2 drops into the left eye daily as needed (for dry eye).    [provider]  carvedilol (COREG) 3.125 MG tablet Take 1 tablet (3.125 mg total) by mouth 2 (two) times daily with a meal. Patient not taking: Reported on 08/11/2017 05/24/13   Angiulli, Mcarthur Rossetti, PA-C  diazepam (VALIUM) 2 MG tablet Take 1 tablet (2 mg total) by mouth every 6 (six) hours as needed for anxiety. Patient not  taking: Reported on 08/11/2017 05/24/13   Angiulli, Mcarthur Rossetti, PA-C  HYDROcodone-acetaminophen (NORCO) 7.5-325 MG per tablet Take 1 tablet by mouth every 6 (six) hours as needed for moderate pain. Patient not taking: Reported on 08/11/2017 05/24/13   Angiulli, Mcarthur Rossetti, PA-C  insulin aspart (NOVOLOG) 100 UNIT/ML injection Inject 5 Units into the skin 3 (three) times daily with meals. Patient not taking: Reported on 08/11/2017 05/24/13   Angiulli, Mcarthur Rossetti, PA-C  insulin glargine (LANTUS) 100 UNIT/ML injection Inject 0.4 mLs (40 Units total) into the skin 2 (two) times daily. Patient taking differently: Inject 50 Units into the skin at bedtime.  05/24/13   Angiulli, Mcarthur Rossetti, PA-C  loperamide (IMODIUM) 2 MG capsule Take 2-4 mg by mouth as needed for diarrhea or loose stools.    [provider]  Melatonin 10 MG CAPS Take 10 mg by mouth at bedtime as needed (for sleep).    [provider]  methocarbamol (ROBAXIN) 500 MG tablet Take 1 tablet (500 mg total) by mouth every 6 (six) hours as needed for muscle spasms. Patient not taking: Reported on 08/11/2017 05/24/13   Angiulli, Mcarthur Rossetti, PA-C  NON FORMULARY Place 1 drop into the right eye every other day. Oil base Eye Drop from MD office    [provider]  nortriptyline (PAMELOR) 50 MG capsule Take 1 capsule (50 mg total) by mouth at bedtime. Patient not taking: Reported on 08/11/2017 05/24/13   Angiulli, Mcarthur Rossetti, PA-C  omeprazole (PRILOSEC OTC) 20 MG tablet Take 20 mg by mouth daily.    [provider]  traMADol (ULTRAM) 50 MG tablet Take 1 tablet (50 mg total) by mouth every 6 (six) hours as needed for moderate pain. Patient not taking: Reported on 08/11/2017 05/24/13   Angiulli, Mcarthur Rossetti, PA-C    Family History Family History  Problem Relation Age of Onset  . Cancer Father     Social History Social History   Tobacco Use  . Smoking status: Former Smoker    Packs/day: 0.25    Years: 15.00    Pack years: 3.75     Types: Cigarettes  . Smokeless tobacco: Never Used  . Tobacco comment: 05/09/2013 "quit smoking 8 1/2 yr ago"  Substance Use Topics  . Alcohol use: No  . Drug use: No     Allergies   Codeine; Darvocet [propoxyphene n-acetaminophen]; Demerol [meperidine]; Ibuprofen; Iodine; Naproxen; Penicillins; Sulfa antibiotics; Hydrocodone; Lidocaine; Other; Oxycodone; and Tape   Review of Systems Review of Systems  Constitutional: Positive for chills and fatigue. Negative for diaphoresis and fever.  HENT: Negative for congestion.   Respiratory: Positive for cough. Negative for chest tightness, shortness of breath and wheezing.   Cardiovascular: Positive for leg swelling (chronic). Negative for chest pain and palpitations.  Gastrointestinal:  Positive for abdominal distention, abdominal pain, constipation, diarrhea (last week), nausea and vomiting. Negative for blood in stool.  Genitourinary: Positive for dysuria. Negative for flank pain and frequency.  Musculoskeletal: Negative for back pain, neck pain and neck stiffness.  Neurological: Negative for headaches.  Psychiatric/Behavioral: Negative for agitation.  All other systems reviewed and are negative.    Physical Exam Updated Vital Signs BP (!) 217/95   Pulse 84   Temp 98.2 F (36.8 C) (Oral)   Resp 18   LMP  (LMP Unknown)   SpO2 99%   Physical Exam  Constitutional: She appears well-developed and well-nourished. No distress.  HENT:  Head: Normocephalic and atraumatic.  Mouth/Throat: Oropharynx is clear and moist. No oropharyngeal exudate.  Eyes: Pupils are equal, round, and reactive to light. Conjunctivae and EOM are normal.  Neck: Normal range of motion. Neck supple.  Cardiovascular: Normal rate and regular rhythm.  No murmur heard. Pulmonary/Chest: Effort normal and breath sounds normal. No stridor. No respiratory distress. She has no wheezes. She has no rales. She exhibits no tenderness.  Abdominal: She exhibits distension.  There is tenderness. There is no guarding.  Musculoskeletal: She exhibits no edema or tenderness.  Neurological: She is alert. No sensory deficit. She exhibits normal muscle tone.  Skin: Skin is warm and dry. Capillary refill takes less than 2 seconds. No rash noted. She is not diaphoretic. No erythema.  Psychiatric: She has a normal mood and affect.  Nursing note and vitals reviewed.    ED Treatments / Results  Labs (all labs ordered are listed, but only abnormal results are displayed) Labs Reviewed  CBC WITH DIFFERENTIAL/PLATELET - Abnormal; Notable for the following components:      Result Value   RBC 5.36 (*)    Hemoglobin 15.1 (*)    HCT 47.4 (*)    Neutro Abs 8.8 (*)    All other components within normal limits  COMPREHENSIVE METABOLIC PANEL - Abnormal; Notable for the following components:   Glucose, Bld 271 (*)    Calcium 8.8 (*)    Total Protein 6.3 (*)    Albumin 3.1 (*)    All other components within normal limits  CBG MONITORING, ED - Abnormal; Notable for the following components:   Glucose-Capillary 270 (*)    All other components within normal limits  URINE CULTURE  CULTURE, BLOOD (ROUTINE X 2)  CULTURE, BLOOD (ROUTINE X 2)  LIPASE, BLOOD  PROTIME-INR  AMMONIA  URINALYSIS, ROUTINE W REFLEX MICROSCOPIC  I-STAT CG4 LACTIC ACID, ED    EKG None  Radiology Ct Abdomen Pelvis Wo Contrast  Result Date: 04/16/2018 CLINICAL DATA:  Abdominal distension with nausea and vomiting EXAM: CT ABDOMEN AND PELVIS WITHOUT CONTRAST TECHNIQUE: Multidetector CT imaging of the abdomen and pelvis was performed following the standard protocol without IV contrast. COMPARISON:  CT 03/27/2008 FINDINGS: Lower chest: Lung bases are clear. No acute consolidation or effusion. Heart size within normal limits. Hepatobiliary: No focal liver abnormality is seen. Status post cholecystectomy. No biliary dilatation. Pancreas: Unremarkable. No pancreatic ductal dilatation or surrounding  inflammatory changes. Spleen: Normal in size without focal abnormality. Adrenals/Urinary Tract: Adrenal glands are normal. No hydronephrosis. Punctate stone mid to upper pole right kidney. 6 mm stone lower pole left kidney. Marked urinary bladder distention. Stomach/Bowel: Stomach is within normal limits. Appendix appears normal. No evidence of bowel wall thickening, distention, or inflammatory changes. Vascular/Lymphatic: Nonaneurysmal aorta. Moderate aortic atherosclerosis. No aneurysm. No significantly enlarged lymph nodes Reproductive: Small amount of air in the  vagina. No adnexal mass. Slightly high position of the uterus. Other: Negative for free air or free fluid. Musculoskeletal: No acute or significant osseous findings. IMPRESSION: 1. Negative for bowel obstruction or bowel wall thickening. No acute intra-abdominal or pelvic abnormality. 2. Small stones within the kidneys without obstruction. Electronically Signed   By: Jasmine Pang M.D.   On: 04/16/2018 21:12   Dg Chest 2 View  Result Date: 04/16/2018 CLINICAL DATA:  61 year old female with a history 2 days of vomiting EXAM: CHEST - 2 VIEW COMPARISON:  01/24/2015 FINDINGS: Cardiomediastinal silhouette unchanged in size and contour. No evidence of central vascular congestion. No pneumothorax or pleural effusion. No confluent airspace disease. Coarsened interstitial markings. No displaced fracture IMPRESSION: Negative for acute cardiopulmonary disease Electronically Signed   By: Gilmer Mor D.O.   On: 04/16/2018 19:06    Procedures Procedures (including critical care time)  Medications Ordered in ED Medications  morphine 4 MG/ML injection 4 mg (4 mg Intravenous Given 04/16/18 1854)  ondansetron (ZOFRAN) injection 4 mg (4 mg Intravenous Given 04/16/18 1852)     Initial Impression / Assessment and Plan / ED Course  I have reviewed the triage vital signs and the nursing notes.  Pertinent labs & imaging results that were available during  my care of the patient were reviewed by me and considered in my medical decision making (see chart for details).     [name] [name] is a 62 y.o. female with a past medical history significant for hypertension, GERD, diabetes, migraines, irritable bowel syndrome, fibromyalgia, and prior BKA from foot infection who presents with abdominal pain, distention, nausea, vomiting, decreased bowel movement, and decreased flatus for the last several days.  Patient reports that she had diarrhea last week but then since Friday has not had a bowel movement or passed gas.  She reports her abdomen has been swelling and she is having severe, 10 out of 10 diffuse abdominal pain.  She reports she has had no oral intake including water over the weekend due to pain.  She reports that she has not been able to take her medications due to the nausea and vomiting and pain.  She reports chills but denies documented fevers at home.  She reports some dry cough.  She denies chest pain or shortness of breath.  She reports feeling fatigued.  She says her glucose has been more elevated and her blood pressure has been up as she is unable to take her medications.  She reports her urine has been darker.   On exam, abdomen is tender diffusely.  Lungs have mild coarseness in the bases.  Patient has audible bowel sounds.  Patient has a right BKA and left leg is edematous.  She reports her leg is always edematous.  She denies recent changes with this.  Given patient's intolerance of p.o. with nausea and vomiting, abdominal distention, and severe pain with absence of bowel movement and flatus, I am concerned about obstruction.  Patient will have laboratory testing as well as imaging to further evaluate.  She will be given pain medicine and nausea medicine.  She does report that she is been able to tolerate morphine in the past without difficulty despite her allergy list.    Anticipate reassessment after work-up.  Diagnostic work-up was  reassuring with no evidence of bowel obstruction.  Suspect constipation in the setting of patient's IBS.  Patient was feeling much better after medications and fluids.  Patient thinks she may be dehydrated causing her to have the  constipation.  Patient was able to tolerate eating and drinking after nausea medicine.  Patient given prescription for Zofran ODT.  Patient will use MiraLAX tomorrow at home and will follow-up with her PCP.  Patient voiced understanding of the plan of care and was feeling better.  Patient discharged in good condition with understanding return precautions.  Final Clinical Impressions(s) / ED Diagnoses   Final diagnoses:  Constipation, unspecified constipation type  Abdominal pain, unspecified abdominal location  Nausea and vomiting, intractability of vomiting not specified, unspecified vomiting type    ED Discharge Orders         Ordered    ondansetron (ZOFRAN ODT) 4 MG disintegrating tablet  Every 8 hours PRN     04/17/18 0030          Clinical Impression: 1. Constipation, unspecified constipation type   2. Abdominal pain, unspecified abdominal location   3. Nausea and vomiting, intractability of vomiting not specified, unspecified vomiting type     Disposition: Discharge  Condition: Good  I have discussed the results, Dx and Tx plan with the pt(& family if present). He/she/they expressed understanding and agree(s) with the plan. Discharge instructions discussed at great length. Strict return precautions discussed and pt &/or family have verbalized understanding of the instructions. No further questions at time of discharge.    Discharge Medication List as of 04/17/2018 12:31 AM    START taking these medications   Details  ondansetron (ZOFRAN ODT) 4 MG disintegrating tablet Take 1 tablet (4 mg total) by mouth every 8 (eight) hours as needed for nausea or vomiting., Starting Tue 04/17/2018, Print        Follow Up: Bufford Spikes, NP [address] [place] [address] [place] [address] [phone]     [facility] EMERGENCY DEPARTMENT [address] [id number] mc [place] [address] [phone]       Miro Balderson, Canary Brim, MD 04/17/18 [phone]

## 2018-04-16 NOTE — ED Notes (Signed)
Main lab contacted for delay in blood work. Verline Lema, lab tech, states tubes were in the "save pile" labs are to be processed

## 2018-04-17 LAB — BLOOD CULTURE ID PANEL (REFLEXED)
ACINETOBACTER BAUMANNII: NOT DETECTED
CANDIDA ALBICANS: NOT DETECTED
CANDIDA GLABRATA: NOT DETECTED
CANDIDA TROPICALIS: NOT DETECTED
Candida krusei: NOT DETECTED
Candida parapsilosis: NOT DETECTED
ENTEROBACTER CLOACAE COMPLEX: NOT DETECTED
ENTEROBACTERIACEAE SPECIES: NOT DETECTED
Enterococcus species: NOT DETECTED
Escherichia coli: NOT DETECTED
HAEMOPHILUS INFLUENZAE: NOT DETECTED
KLEBSIELLA OXYTOCA: NOT DETECTED
KLEBSIELLA PNEUMONIAE: NOT DETECTED
Listeria monocytogenes: NOT DETECTED
METHICILLIN RESISTANCE: NOT DETECTED
Neisseria meningitidis: NOT DETECTED
PSEUDOMONAS AERUGINOSA: NOT DETECTED
Proteus species: NOT DETECTED
STREPTOCOCCUS PYOGENES: NOT DETECTED
Serratia marcescens: NOT DETECTED
Staphylococcus aureus (BCID): NOT DETECTED
Staphylococcus species: DETECTED — AB
Streptococcus agalactiae: NOT DETECTED
Streptococcus pneumoniae: NOT DETECTED
Streptococcus species: NOT DETECTED

## 2018-04-17 MED ORDER — ONDANSETRON 4 MG PO TBDP: 4.0000 mg | ORAL_TABLET | Freq: Three times a day (TID) | ORAL | 0 refills | Status: AC | PRN

## 2018-04-17 NOTE — ED Notes (Signed)
Pt given Diet Ginger ale for PO challenge

## 2018-04-17 NOTE — Discharge Instructions (Signed)
Your work-up today showed no evidence of obstruction or other intra-abdominal abnormality.  I suspect you are having constipation causing your symptoms.  Please stay hydrated.  Please use the nausea medicine to help maintain hydration.  Please follow-up with your primary doctor.  If any symptoms change or worsen, please return to the nearest emergency department.

## 2018-04-17 NOTE — ED Notes (Signed)
Pt tolerated diet ginger ale without nausea

## 2018-04-17 NOTE — ED Notes (Signed)
Provider at bedside

## 2018-04-19 LAB — CULTURE, BLOOD (ROUTINE X 2)

## 2018-04-20 ENCOUNTER — Telehealth: Payer: Self-pay | Admitting: *Deleted

## 2018-04-20 NOTE — Telephone Encounter (Signed)
Post ED Visit - Positive Culture Follow-up  Culture report reviewed by antimicrobial stewardship pharmacist:  []  Enzo BiNathan Batchelder, Pharm.D. []  Celedonio MiyamotoJeremy Frens, Pharm.D., BCPS AQ-ID []  Garvin FilaMike Maccia, Pharm.D., BCPS []  Georgina PillionElizabeth Martin, Pharm.D., BCPS []  CoramMinh Pham, [address] BoulevardPharm.D., BCPS, AAHIVP []  Estella HuskMichelle Turner, Pharm.D., BCPS, AAHIVP []  Lysle Pearlachel Rumbarger, PharmD, BCPS []  Phillips Climeshuy Dang, PharmD, BCPS []  Agapito GamesAlison Masters, PharmD, BCPS []  Verlan FriendsErin Deja, PharmD Bradley FerrisJoshua Tessin, PharmD  Positive urine culture Contaminant and no further patient follow-up is required at this time.  Virl AxeRobertson, Devyn Griffing [facility] Vent[facility] LLCalley 04/20/2018, 8:34 AM

## 2018-04-21 LAB — CULTURE, BLOOD (ROUTINE X 2)
Culture: NO GROWTH
SPECIAL REQUESTS: ADEQUATE

## 2018-06-25 DIAGNOSIS — M25519 Pain in unspecified shoulder: Secondary | ICD-10-CM | POA: Diagnosis not present

## 2018-06-25 DIAGNOSIS — E1165 Type 2 diabetes mellitus with hyperglycemia: Secondary | ICD-10-CM | POA: Diagnosis not present

## 2018-06-25 DIAGNOSIS — L988 Other specified disorders of the skin and subcutaneous tissue: Secondary | ICD-10-CM | POA: Diagnosis not present

## 2018-06-25 DIAGNOSIS — B379 Candidiasis, unspecified: Secondary | ICD-10-CM | POA: Diagnosis not present

## 2018-06-25 DIAGNOSIS — F332 Major depressive disorder, recurrent severe without psychotic features: Secondary | ICD-10-CM | POA: Diagnosis not present

## 2018-06-25 DIAGNOSIS — L989 Disorder of the skin and subcutaneous tissue, unspecified: Secondary | ICD-10-CM | POA: Diagnosis not present

## 2018-06-25 DIAGNOSIS — M797 Fibromyalgia: Secondary | ICD-10-CM | POA: Diagnosis not present

## 2018-06-25 DIAGNOSIS — N200 Calculus of kidney: Secondary | ICD-10-CM | POA: Diagnosis not present

## 2018-06-25 DIAGNOSIS — K625 Hemorrhage of anus and rectum: Secondary | ICD-10-CM | POA: Diagnosis not present

## 2018-06-25 DIAGNOSIS — R531 Weakness: Secondary | ICD-10-CM | POA: Diagnosis not present

## 2018-06-25 DIAGNOSIS — R197 Diarrhea, unspecified: Secondary | ICD-10-CM | POA: Diagnosis not present

## 2018-06-25 DIAGNOSIS — Z794 Long term (current) use of insulin: Secondary | ICD-10-CM | POA: Diagnosis not present

## 2018-06-25 DIAGNOSIS — R1030 Lower abdominal pain, unspecified: Secondary | ICD-10-CM | POA: Diagnosis not present

## 2018-07-23 DIAGNOSIS — E1140 Type 2 diabetes mellitus with diabetic neuropathy, unspecified: Secondary | ICD-10-CM | POA: Diagnosis not present

## 2018-07-23 DIAGNOSIS — R197 Diarrhea, unspecified: Secondary | ICD-10-CM | POA: Diagnosis not present

## 2018-07-23 DIAGNOSIS — R300 Dysuria: Secondary | ICD-10-CM | POA: Diagnosis not present

## 2018-07-23 DIAGNOSIS — E1165 Type 2 diabetes mellitus with hyperglycemia: Secondary | ICD-10-CM | POA: Diagnosis not present

## 2018-07-23 DIAGNOSIS — B372 Candidiasis of skin and nail: Secondary | ICD-10-CM | POA: Diagnosis not present

## 2018-07-23 DIAGNOSIS — F332 Major depressive disorder, recurrent severe without psychotic features: Secondary | ICD-10-CM | POA: Diagnosis not present

## 2018-08-05 DIAGNOSIS — E1140 Type 2 diabetes mellitus with diabetic neuropathy, unspecified: Secondary | ICD-10-CM | POA: Diagnosis not present

## 2018-08-05 DIAGNOSIS — E1165 Type 2 diabetes mellitus with hyperglycemia: Secondary | ICD-10-CM | POA: Diagnosis not present

## 2018-08-05 DIAGNOSIS — Z89511 Acquired absence of right leg below knee: Secondary | ICD-10-CM | POA: Diagnosis not present

## 2018-08-05 DIAGNOSIS — Z993 Dependence on wheelchair: Secondary | ICD-10-CM | POA: Diagnosis not present

## 2018-08-05 DIAGNOSIS — Z794 Long term (current) use of insulin: Secondary | ICD-10-CM | POA: Diagnosis not present

## 2018-08-05 DIAGNOSIS — F329 Major depressive disorder, single episode, unspecified: Secondary | ICD-10-CM | POA: Diagnosis not present

## 2018-08-05 DIAGNOSIS — R197 Diarrhea, unspecified: Secondary | ICD-10-CM | POA: Diagnosis not present

## 2018-08-06 DIAGNOSIS — R6521 Severe sepsis with septic shock: Secondary | ICD-10-CM | POA: Diagnosis not present

## 2018-08-06 DIAGNOSIS — A4189 Other specified sepsis: Secondary | ICD-10-CM | POA: Diagnosis not present

## 2018-08-06 DIAGNOSIS — Z91041 Radiographic dye allergy status: Secondary | ICD-10-CM | POA: Diagnosis not present

## 2018-08-06 DIAGNOSIS — R531 Weakness: Secondary | ICD-10-CM | POA: Diagnosis not present

## 2018-08-06 DIAGNOSIS — Z888 Allergy status to other drugs, medicaments and biological substances status: Secondary | ICD-10-CM | POA: Diagnosis not present

## 2018-08-06 DIAGNOSIS — K529 Noninfective gastroenteritis and colitis, unspecified: Secondary | ICD-10-CM | POA: Diagnosis not present

## 2018-08-06 DIAGNOSIS — Z9049 Acquired absence of other specified parts of digestive tract: Secondary | ICD-10-CM | POA: Diagnosis not present

## 2018-08-06 DIAGNOSIS — R404 Transient alteration of awareness: Secondary | ICD-10-CM | POA: Diagnosis not present

## 2018-08-06 DIAGNOSIS — B952 Enterococcus as the cause of diseases classified elsewhere: Secondary | ICD-10-CM | POA: Diagnosis present

## 2018-08-06 DIAGNOSIS — Z4682 Encounter for fitting and adjustment of non-vascular catheter: Secondary | ICD-10-CM | POA: Diagnosis not present

## 2018-08-06 DIAGNOSIS — J9601 Acute respiratory failure with hypoxia: Secondary | ICD-10-CM | POA: Diagnosis not present

## 2018-08-06 DIAGNOSIS — K5931 Toxic megacolon: Secondary | ICD-10-CM | POA: Diagnosis not present

## 2018-08-06 DIAGNOSIS — K589 Irritable bowel syndrome without diarrhea: Secondary | ICD-10-CM | POA: Diagnosis present

## 2018-08-06 DIAGNOSIS — Z882 Allergy status to sulfonamides status: Secondary | ICD-10-CM | POA: Diagnosis not present

## 2018-08-06 DIAGNOSIS — Z932 Ileostomy status: Secondary | ICD-10-CM | POA: Diagnosis not present

## 2018-08-06 DIAGNOSIS — E876 Hypokalemia: Secondary | ICD-10-CM | POA: Diagnosis present

## 2018-08-06 DIAGNOSIS — M797 Fibromyalgia: Secondary | ICD-10-CM | POA: Diagnosis present

## 2018-08-06 DIAGNOSIS — I10 Essential (primary) hypertension: Secondary | ICD-10-CM | POA: Diagnosis present

## 2018-08-06 DIAGNOSIS — D649 Anemia, unspecified: Secondary | ICD-10-CM | POA: Diagnosis present

## 2018-08-06 DIAGNOSIS — J95821 Acute postprocedural respiratory failure: Secondary | ICD-10-CM | POA: Diagnosis not present

## 2018-08-06 DIAGNOSIS — R1033 Periumbilical pain: Secondary | ICD-10-CM | POA: Diagnosis not present

## 2018-08-06 DIAGNOSIS — I959 Hypotension, unspecified: Secondary | ICD-10-CM | POA: Diagnosis not present

## 2018-08-06 DIAGNOSIS — Z452 Encounter for adjustment and management of vascular access device: Secondary | ICD-10-CM | POA: Diagnosis not present

## 2018-08-06 DIAGNOSIS — N179 Acute kidney failure, unspecified: Secondary | ICD-10-CM | POA: Diagnosis present

## 2018-08-06 DIAGNOSIS — R1111 Vomiting without nausea: Secondary | ICD-10-CM | POA: Diagnosis not present

## 2018-08-06 DIAGNOSIS — A045 Campylobacter enteritis: Secondary | ICD-10-CM | POA: Diagnosis present

## 2018-08-06 DIAGNOSIS — R110 Nausea: Secondary | ICD-10-CM | POA: Diagnosis not present

## 2018-08-06 DIAGNOSIS — K6389 Other specified diseases of intestine: Secondary | ICD-10-CM | POA: Diagnosis not present

## 2018-08-06 DIAGNOSIS — R197 Diarrhea, unspecified: Secondary | ICD-10-CM | POA: Diagnosis not present

## 2018-08-06 DIAGNOSIS — K219 Gastro-esophageal reflux disease without esophagitis: Secondary | ICD-10-CM | POA: Diagnosis present

## 2018-08-06 DIAGNOSIS — A0472 Enterocolitis due to Clostridium difficile, not specified as recurrent: Secondary | ICD-10-CM | POA: Diagnosis present

## 2018-08-06 DIAGNOSIS — M25551 Pain in right hip: Secondary | ICD-10-CM | POA: Diagnosis present

## 2018-08-06 DIAGNOSIS — N390 Urinary tract infection, site not specified: Secondary | ICD-10-CM | POA: Diagnosis present

## 2018-08-06 DIAGNOSIS — M1990 Unspecified osteoarthritis, unspecified site: Secondary | ICD-10-CM | POA: Diagnosis present

## 2018-08-06 DIAGNOSIS — E1165 Type 2 diabetes mellitus with hyperglycemia: Secondary | ICD-10-CM | POA: Diagnosis present

## 2018-08-06 DIAGNOSIS — Z794 Long term (current) use of insulin: Secondary | ICD-10-CM | POA: Diagnosis not present

## 2018-08-06 DIAGNOSIS — N200 Calculus of kidney: Secondary | ICD-10-CM | POA: Diagnosis not present

## 2018-08-06 DIAGNOSIS — Z89511 Acquired absence of right leg below knee: Secondary | ICD-10-CM | POA: Diagnosis not present

## 2018-08-06 DIAGNOSIS — A09 Infectious gastroenteritis and colitis, unspecified: Secondary | ICD-10-CM | POA: Diagnosis not present

## 2018-08-06 DIAGNOSIS — R0902 Hypoxemia: Secondary | ICD-10-CM | POA: Diagnosis not present

## 2018-08-06 DIAGNOSIS — Z880 Allergy status to penicillin: Secondary | ICD-10-CM | POA: Diagnosis not present

## 2018-08-06 DIAGNOSIS — M25552 Pain in left hip: Secondary | ICD-10-CM | POA: Diagnosis present

## 2018-08-06 DIAGNOSIS — M160 Bilateral primary osteoarthritis of hip: Secondary | ICD-10-CM | POA: Diagnosis not present

## 2018-08-06 DIAGNOSIS — I9589 Other hypotension: Secondary | ICD-10-CM | POA: Diagnosis not present

## 2018-08-06 DIAGNOSIS — J90 Pleural effusion, not elsewhere classified: Secondary | ICD-10-CM | POA: Diagnosis not present

## 2018-08-08 DIAGNOSIS — M25551 Pain in right hip: Secondary | ICD-10-CM

## 2018-08-08 DIAGNOSIS — Z9049 Acquired absence of other specified parts of digestive tract: Secondary | ICD-10-CM

## 2018-08-08 DIAGNOSIS — D649 Anemia, unspecified: Secondary | ICD-10-CM

## 2018-08-08 DIAGNOSIS — I10 Essential (primary) hypertension: Secondary | ICD-10-CM

## 2018-08-08 DIAGNOSIS — Z932 Ileostomy status: Secondary | ICD-10-CM

## 2018-08-08 DIAGNOSIS — N390 Urinary tract infection, site not specified: Secondary | ICD-10-CM

## 2018-08-08 DIAGNOSIS — J9601 Acute respiratory failure with hypoxia: Secondary | ICD-10-CM

## 2018-08-14 ENCOUNTER — Inpatient Hospital Stay
Admission: RE | Admit: 2018-08-14 | Discharge: 2018-09-01 | Disposition: A | Discharge status: Skilled Nursing Facility | Payer: Medicare Other | Source: Other Acute Inpatient Hospital | Attending: Internal Medicine | Admitting: Internal Medicine

## 2018-08-14 DIAGNOSIS — M545 Low back pain: Secondary | ICD-10-CM | POA: Diagnosis not present

## 2018-08-14 DIAGNOSIS — L89156 Pressure-induced deep tissue damage of sacral region: Secondary | ICD-10-CM | POA: Diagnosis present

## 2018-08-14 DIAGNOSIS — Z9049 Acquired absence of other specified parts of digestive tract: Secondary | ICD-10-CM | POA: Diagnosis not present

## 2018-08-14 DIAGNOSIS — G8912 Acute post-thoracotomy pain: Secondary | ICD-10-CM | POA: Diagnosis not present

## 2018-08-14 DIAGNOSIS — Z89511 Acquired absence of right leg below knee: Secondary | ICD-10-CM | POA: Diagnosis not present

## 2018-08-14 DIAGNOSIS — Z452 Encounter for adjustment and management of vascular access device: Secondary | ICD-10-CM

## 2018-08-14 DIAGNOSIS — N390 Urinary tract infection, site not specified: Secondary | ICD-10-CM | POA: Diagnosis not present

## 2018-08-14 DIAGNOSIS — R6520 Severe sepsis without septic shock: Secondary | ICD-10-CM | POA: Diagnosis not present

## 2018-08-14 DIAGNOSIS — K589 Irritable bowel syndrome without diarrhea: Secondary | ICD-10-CM | POA: Diagnosis present

## 2018-08-14 DIAGNOSIS — A045 Campylobacter enteritis: Secondary | ICD-10-CM | POA: Diagnosis not present

## 2018-08-14 DIAGNOSIS — M25552 Pain in left hip: Secondary | ICD-10-CM | POA: Diagnosis not present

## 2018-08-14 DIAGNOSIS — Z794 Long term (current) use of insulin: Secondary | ICD-10-CM | POA: Diagnosis not present

## 2018-08-14 DIAGNOSIS — G43909 Migraine, unspecified, not intractable, without status migrainosus: Secondary | ICD-10-CM | POA: Diagnosis present

## 2018-08-14 DIAGNOSIS — A0472 Enterocolitis due to Clostridium difficile, not specified as recurrent: Secondary | ICD-10-CM | POA: Diagnosis present

## 2018-08-14 DIAGNOSIS — E46 Unspecified protein-calorie malnutrition: Secondary | ICD-10-CM | POA: Diagnosis not present

## 2018-08-14 DIAGNOSIS — R1310 Dysphagia, unspecified: Secondary | ICD-10-CM | POA: Diagnosis present

## 2018-08-14 DIAGNOSIS — M160 Bilateral primary osteoarthritis of hip: Secondary | ICD-10-CM | POA: Diagnosis present

## 2018-08-14 DIAGNOSIS — M1990 Unspecified osteoarthritis, unspecified site: Secondary | ICD-10-CM | POA: Diagnosis not present

## 2018-08-14 DIAGNOSIS — F339 Major depressive disorder, recurrent, unspecified: Secondary | ICD-10-CM | POA: Diagnosis not present

## 2018-08-14 DIAGNOSIS — Z48815 Encounter for surgical aftercare following surgery on the digestive system: Secondary | ICD-10-CM | POA: Diagnosis not present

## 2018-08-14 DIAGNOSIS — Z932 Ileostomy status: Secondary | ICD-10-CM | POA: Diagnosis not present

## 2018-08-14 DIAGNOSIS — E119 Type 2 diabetes mellitus without complications: Secondary | ICD-10-CM | POA: Diagnosis not present

## 2018-08-14 DIAGNOSIS — J9600 Acute respiratory failure, unspecified whether with hypoxia or hypercapnia: Secondary | ICD-10-CM | POA: Diagnosis not present

## 2018-08-14 DIAGNOSIS — E440 Moderate protein-calorie malnutrition: Secondary | ICD-10-CM | POA: Diagnosis present

## 2018-08-14 DIAGNOSIS — M797 Fibromyalgia: Secondary | ICD-10-CM | POA: Diagnosis present

## 2018-08-14 DIAGNOSIS — I10 Essential (primary) hypertension: Secondary | ICD-10-CM | POA: Diagnosis not present

## 2018-08-14 DIAGNOSIS — S37099S Other injury of unspecified kidney, sequela: Secondary | ICD-10-CM | POA: Diagnosis not present

## 2018-08-14 DIAGNOSIS — R531 Weakness: Secondary | ICD-10-CM | POA: Diagnosis present

## 2018-08-14 DIAGNOSIS — D638 Anemia in other chronic diseases classified elsewhere: Secondary | ICD-10-CM | POA: Diagnosis not present

## 2018-08-14 DIAGNOSIS — M25551 Pain in right hip: Secondary | ICD-10-CM | POA: Diagnosis not present

## 2018-08-14 DIAGNOSIS — K219 Gastro-esophageal reflux disease without esophagitis: Secondary | ICD-10-CM | POA: Diagnosis present

## 2018-08-14 DIAGNOSIS — R5381 Other malaise: Secondary | ICD-10-CM | POA: Diagnosis not present

## 2018-08-14 DIAGNOSIS — E1165 Type 2 diabetes mellitus with hyperglycemia: Secondary | ICD-10-CM | POA: Diagnosis present

## 2018-08-14 DIAGNOSIS — R279 Unspecified lack of coordination: Secondary | ICD-10-CM | POA: Diagnosis not present

## 2018-08-14 DIAGNOSIS — Z6837 Body mass index (BMI) 37.0-37.9, adult: Secondary | ICD-10-CM | POA: Diagnosis not present

## 2018-08-14 DIAGNOSIS — G8922 Chronic post-thoracotomy pain: Secondary | ICD-10-CM | POA: Diagnosis not present

## 2018-08-14 DIAGNOSIS — Z89512 Acquired absence of left leg below knee: Secondary | ICD-10-CM | POA: Diagnosis not present

## 2018-08-14 DIAGNOSIS — Z432 Encounter for attention to ileostomy: Secondary | ICD-10-CM | POA: Diagnosis not present

## 2018-08-14 DIAGNOSIS — D649 Anemia, unspecified: Secondary | ICD-10-CM | POA: Diagnosis not present

## 2018-08-14 DIAGNOSIS — N179 Acute kidney failure, unspecified: Secondary | ICD-10-CM | POA: Diagnosis not present

## 2018-08-14 DIAGNOSIS — F329 Major depressive disorder, single episode, unspecified: Secondary | ICD-10-CM | POA: Diagnosis present

## 2018-08-14 DIAGNOSIS — R51 Headache: Secondary | ICD-10-CM | POA: Diagnosis not present

## 2018-08-14 DIAGNOSIS — Z86718 Personal history of other venous thrombosis and embolism: Secondary | ICD-10-CM | POA: Diagnosis not present

## 2018-08-14 DIAGNOSIS — J9601 Acute respiratory failure with hypoxia: Secondary | ICD-10-CM | POA: Diagnosis not present

## 2018-08-14 DIAGNOSIS — F419 Anxiety disorder, unspecified: Secondary | ICD-10-CM | POA: Diagnosis present

## 2018-08-14 DIAGNOSIS — L89626 Pressure-induced deep tissue damage of left heel: Secondary | ICD-10-CM | POA: Diagnosis present

## 2018-08-14 DIAGNOSIS — A419 Sepsis, unspecified organism: Secondary | ICD-10-CM | POA: Diagnosis not present

## 2018-08-14 DIAGNOSIS — G894 Chronic pain syndrome: Secondary | ICD-10-CM | POA: Diagnosis not present

## 2018-08-14 DIAGNOSIS — K5931 Toxic megacolon: Secondary | ICD-10-CM | POA: Diagnosis not present

## 2018-08-14 DIAGNOSIS — M6281 Muscle weakness (generalized): Secondary | ICD-10-CM | POA: Diagnosis not present

## 2018-08-14 DIAGNOSIS — Z743 Need for continuous supervision: Secondary | ICD-10-CM | POA: Diagnosis not present

## 2018-08-14 DIAGNOSIS — A4189 Other specified sepsis: Secondary | ICD-10-CM | POA: Diagnosis not present

## 2018-08-15 ENCOUNTER — Other Ambulatory Visit (HOSPITAL_COMMUNITY): Payer: Medicare Other

## 2018-08-15 LAB — CBC WITH DIFFERENTIAL/PLATELET
Abs Immature Granulocytes: 0.28 10*3/uL — ABNORMAL HIGH (ref 0.00–0.07)
Basophils Absolute: 0 10*3/uL (ref 0.0–0.1)
Basophils Relative: 0 %
Eosinophils Absolute: 0.4 10*3/uL (ref 0.0–0.5)
Eosinophils Relative: 4 %
HCT: 29.8 % — ABNORMAL LOW (ref 36.0–46.0)
Hemoglobin: 9.8 g/dL — ABNORMAL LOW (ref 12.0–15.0)
Immature Granulocytes: 2 %
Lymphocytes Relative: 15 %
Lymphs Abs: 1.8 10*3/uL (ref 0.7–4.0)
MCH: 29.6 pg (ref 26.0–34.0)
MCHC: 32.9 g/dL (ref 30.0–36.0)
MCV: 90 fL (ref 80.0–100.0)
Monocytes Absolute: 0.8 10*3/uL (ref 0.1–1.0)
Monocytes Relative: 7 %
Neutro Abs: 8.8 10*3/uL — ABNORMAL HIGH (ref 1.7–7.7)
Neutrophils Relative %: 72 %
Platelets: 140 10*3/uL — ABNORMAL LOW (ref 150–400)
RBC: 3.31 MIL/uL — ABNORMAL LOW (ref 3.87–5.11)
RDW: 13.8 % (ref 11.5–15.5)
WBC: 12.1 10*3/uL — ABNORMAL HIGH (ref 4.0–10.5)
nRBC: 0 % (ref 0.0–0.2)

## 2018-08-15 LAB — PROTIME-INR
INR: 1.2 (ref 0.8–1.2)
Prothrombin Time: 15.5 seconds — ABNORMAL HIGH (ref 11.4–15.2)

## 2018-08-15 LAB — COMPREHENSIVE METABOLIC PANEL
ALT: 11 U/L (ref 0–44)
AST: 18 U/L (ref 15–41)
Albumin: 2 g/dL — ABNORMAL LOW (ref 3.5–5.0)
Alkaline Phosphatase: 101 U/L (ref 38–126)
Anion gap: 5 (ref 5–15)
BUN: 13 mg/dL (ref 8–23)
CHLORIDE: 109 mmol/L (ref 98–111)
CO2: 25 mmol/L (ref 22–32)
Calcium: 7.5 mg/dL — ABNORMAL LOW (ref 8.9–10.3)
Creatinine, Ser: 0.66 mg/dL (ref 0.44–1.00)
Glucose, Bld: 194 mg/dL — ABNORMAL HIGH (ref 70–99)
POTASSIUM: 4 mmol/L (ref 3.5–5.1)
Sodium: 139 mmol/L (ref 135–145)
Total Bilirubin: 0.5 mg/dL (ref 0.3–1.2)
Total Protein: 3.7 g/dL — ABNORMAL LOW (ref 6.5–8.1)

## 2018-08-15 LAB — MAGNESIUM: Magnesium: 1.7 mg/dL (ref 1.7–2.4)

## 2018-08-15 LAB — PHOSPHORUS: Phosphorus: 2.4 mg/dL — ABNORMAL LOW (ref 2.5–4.6)

## 2018-08-16 LAB — TRIGLYCERIDES: Triglycerides: 197 mg/dL — ABNORMAL HIGH (ref <150)

## 2018-08-16 LAB — MAGNESIUM: Magnesium: 2 mg/dL (ref 1.7–2.4)

## 2018-08-16 LAB — PHOSPHORUS: Phosphorus: 3 mg/dL (ref 2.5–4.6)

## 2018-08-18 LAB — BASIC METABOLIC PANEL
ANION GAP: 6 (ref 5–15)
BUN: 21 mg/dL (ref 8–23)
CO2: 23 mmol/L (ref 22–32)
Calcium: 7.7 mg/dL — ABNORMAL LOW (ref 8.9–10.3)
Chloride: 104 mmol/L (ref 98–111)
Creatinine, Ser: 0.73 mg/dL (ref 0.44–1.00)
GFR calc Af Amer: >60 mL/min (ref >60)
GFR calc non Af Amer: >60 mL/min (ref >60)
Glucose, Bld: 362 mg/dL — ABNORMAL HIGH (ref 70–99)
Potassium: 4.4 mmol/L (ref 3.5–5.1)
Sodium: 133 mmol/L — ABNORMAL LOW (ref 135–145)

## 2018-08-18 LAB — MAGNESIUM: Magnesium: 2.1 mg/dL (ref 1.7–2.4)

## 2018-08-18 LAB — PHOSPHORUS: Phosphorus: 4 mg/dL (ref 2.5–4.6)

## 2018-08-19 LAB — TRIGLYCERIDES: Triglycerides: 212 mg/dL — ABNORMAL HIGH (ref <150)

## 2018-08-19 LAB — MAGNESIUM: MAGNESIUM: 2 mg/dL (ref 1.7–2.4)

## 2018-08-19 LAB — PHOSPHORUS: Phosphorus: 3.9 mg/dL (ref 2.5–4.6)

## 2018-08-20 LAB — BASIC METABOLIC PANEL
Anion gap: 4 — ABNORMAL LOW (ref 5–15)
BUN: 30 mg/dL — ABNORMAL HIGH (ref 8–23)
CO2: 19 mmol/L — ABNORMAL LOW (ref 22–32)
Calcium: 7.5 mg/dL — ABNORMAL LOW (ref 8.9–10.3)
Chloride: 109 mmol/L (ref 98–111)
Creatinine, Ser: 0.71 mg/dL (ref 0.44–1.00)
GFR calc non Af Amer: >60 mL/min (ref >60)
Glucose, Bld: 207 mg/dL — ABNORMAL HIGH (ref 70–99)
Potassium: 5 mmol/L (ref 3.5–5.1)
Sodium: 132 mmol/L — ABNORMAL LOW (ref 135–145)

## 2018-08-20 LAB — CBC
HEMATOCRIT: 25 % — AB (ref 36.0–46.0)
Hemoglobin: 8.3 g/dL — ABNORMAL LOW (ref 12.0–15.0)
MCH: 30.3 pg (ref 26.0–34.0)
MCHC: 33.2 g/dL (ref 30.0–36.0)
MCV: 91.2 fL (ref 80.0–100.0)
Platelets: 219 10*3/uL (ref 150–400)
RBC: 2.74 MIL/uL — ABNORMAL LOW (ref 3.87–5.11)
RDW: 14.4 % (ref 11.5–15.5)
WBC: 8 10*3/uL (ref 4.0–10.5)
nRBC: 0 % (ref 0.0–0.2)

## 2018-08-20 LAB — PHOSPHORUS: Phosphorus: 3.8 mg/dL (ref 2.5–4.6)

## 2018-08-20 LAB — MAGNESIUM: Magnesium: 1.9 mg/dL (ref 1.7–2.4)

## 2018-08-20 LAB — HEMOGLOBIN A1C
Hgb A1c MFr Bld: 8.3 % — ABNORMAL HIGH (ref 4.8–5.6)
Mean Plasma Glucose: 191.51 mg/dL

## 2018-08-21 LAB — CBC
HCT: 24.8 % — ABNORMAL LOW (ref 36.0–46.0)
HEMOGLOBIN: 8.1 g/dL — AB (ref 12.0–15.0)
MCH: 29.9 pg (ref 26.0–34.0)
MCHC: 32.7 g/dL (ref 30.0–36.0)
MCV: 91.5 fL (ref 80.0–100.0)
Platelets: 232 10*3/uL (ref 150–400)
RBC: 2.71 MIL/uL — AB (ref 3.87–5.11)
RDW: 14.6 % (ref 11.5–15.5)
WBC: 6.4 10*3/uL (ref 4.0–10.5)
nRBC: 0 % (ref 0.0–0.2)

## 2018-08-21 LAB — BASIC METABOLIC PANEL
Anion gap: 6 (ref 5–15)
BUN: 28 mg/dL — ABNORMAL HIGH (ref 8–23)
CO2: 21 mmol/L — ABNORMAL LOW (ref 22–32)
Calcium: 7.8 mg/dL — ABNORMAL LOW (ref 8.9–10.3)
Chloride: 108 mmol/L (ref 98–111)
Creatinine, Ser: 0.74 mg/dL (ref 0.44–1.00)
GFR calc non Af Amer: >60 mL/min (ref >60)
Glucose, Bld: 173 mg/dL — ABNORMAL HIGH (ref 70–99)
Potassium: 4.8 mmol/L (ref 3.5–5.1)
Sodium: 135 mmol/L (ref 135–145)

## 2018-08-21 LAB — PHOSPHORUS: PHOSPHORUS: 4.1 mg/dL (ref 2.5–4.6)

## 2018-08-21 LAB — MAGNESIUM: Magnesium: 2 mg/dL (ref 1.7–2.4)

## 2018-08-22 LAB — MAGNESIUM: Magnesium: 1.8 mg/dL (ref 1.7–2.4)

## 2018-08-22 LAB — PHOSPHORUS: Phosphorus: 4.2 mg/dL (ref 2.5–4.6)

## 2018-08-22 LAB — TRIGLYCERIDES: Triglycerides: 151 mg/dL — ABNORMAL HIGH (ref <150)

## 2018-08-23 LAB — C DIFFICILE QUICK SCREEN W PCR REFLEX
C Diff antigen: NEGATIVE
C Diff interpretation: NOT DETECTED
C Diff toxin: NEGATIVE

## 2018-08-25 LAB — TRIGLYCERIDES: Triglycerides: 156 mg/dL — ABNORMAL HIGH (ref <150)

## 2018-08-25 LAB — PHOSPHORUS: Phosphorus: 4.4 mg/dL (ref 2.5–4.6)

## 2018-08-25 LAB — MAGNESIUM: Magnesium: 1.8 mg/dL (ref 1.7–2.4)

## 2018-08-27 LAB — CBC
HCT: 26.9 % — ABNORMAL LOW (ref 36.0–46.0)
Hemoglobin: 8.3 g/dL — ABNORMAL LOW (ref 12.0–15.0)
MCH: 27.9 pg (ref 26.0–34.0)
MCHC: 30.9 g/dL (ref 30.0–36.0)
MCV: 90.3 fL (ref 80.0–100.0)
Platelets: 185 10*3/uL (ref 150–400)
RBC: 2.98 MIL/uL — ABNORMAL LOW (ref 3.87–5.11)
RDW: 14 % (ref 11.5–15.5)
WBC: 7.7 10*3/uL (ref 4.0–10.5)
nRBC: 0 % (ref 0.0–0.2)

## 2018-08-27 LAB — BASIC METABOLIC PANEL
Anion gap: 8 (ref 5–15)
BUN: 21 mg/dL (ref 8–23)
CALCIUM: 7.8 mg/dL — AB (ref 8.9–10.3)
CO2: 22 mmol/L (ref 22–32)
Chloride: 107 mmol/L (ref 98–111)
Creatinine, Ser: 0.9 mg/dL (ref 0.44–1.00)
GFR calc non Af Amer: >60 mL/min (ref >60)
Glucose, Bld: 125 mg/dL — ABNORMAL HIGH (ref 70–99)
Potassium: 4.4 mmol/L (ref 3.5–5.1)
Sodium: 137 mmol/L (ref 135–145)

## 2018-08-27 LAB — PHOSPHORUS: PHOSPHORUS: 4.3 mg/dL (ref 2.5–4.6)

## 2018-08-27 LAB — MAGNESIUM: Magnesium: 1.8 mg/dL (ref 1.7–2.4)

## 2018-08-31 LAB — BASIC METABOLIC PANEL
Anion gap: 6 (ref 5–15)
BUN: 19 mg/dL (ref 8–23)
CALCIUM: 8.2 mg/dL — AB (ref 8.9–10.3)
CO2: 24 mmol/L (ref 22–32)
Chloride: 109 mmol/L (ref 98–111)
Creatinine, Ser: 0.7 mg/dL (ref 0.44–1.00)
GFR calc non Af Amer: >60 mL/min (ref >60)
Glucose, Bld: 69 mg/dL — ABNORMAL LOW (ref 70–99)
Potassium: 4.5 mmol/L (ref 3.5–5.1)
Sodium: 139 mmol/L (ref 135–145)

## 2018-08-31 LAB — CBC
HCT: 27.6 % — ABNORMAL LOW (ref 36.0–46.0)
Hemoglobin: 8.5 g/dL — ABNORMAL LOW (ref 12.0–15.0)
MCH: 27.8 pg (ref 26.0–34.0)
MCHC: 30.8 g/dL (ref 30.0–36.0)
MCV: 90.2 fL (ref 80.0–100.0)
NRBC: 0 % (ref 0.0–0.2)
Platelets: 184 10*3/uL (ref 150–400)
RBC: 3.06 MIL/uL — ABNORMAL LOW (ref 3.87–5.11)
RDW: 13.7 % (ref 11.5–15.5)
WBC: 7.2 10*3/uL (ref 4.0–10.5)

## 2018-08-31 LAB — MAGNESIUM: Magnesium: 2 mg/dL (ref 1.7–2.4)

## 2018-09-01 DIAGNOSIS — R52 Pain, unspecified: Secondary | ICD-10-CM | POA: Diagnosis not present

## 2018-09-01 DIAGNOSIS — R5381 Other malaise: Secondary | ICD-10-CM | POA: Diagnosis not present

## 2018-09-01 DIAGNOSIS — L98499 Non-pressure chronic ulcer of skin of other sites with unspecified severity: Secondary | ICD-10-CM | POA: Diagnosis not present

## 2018-09-01 DIAGNOSIS — R0989 Other specified symptoms and signs involving the circulatory and respiratory systems: Secondary | ICD-10-CM | POA: Diagnosis not present

## 2018-09-01 DIAGNOSIS — R1310 Dysphagia, unspecified: Secondary | ICD-10-CM | POA: Diagnosis not present

## 2018-09-01 DIAGNOSIS — Z743 Need for continuous supervision: Secondary | ICD-10-CM | POA: Diagnosis not present

## 2018-09-01 DIAGNOSIS — R531 Weakness: Secondary | ICD-10-CM | POA: Diagnosis not present

## 2018-09-01 DIAGNOSIS — F339 Major depressive disorder, recurrent, unspecified: Secondary | ICD-10-CM | POA: Diagnosis not present

## 2018-09-01 DIAGNOSIS — Z931 Gastrostomy status: Secondary | ICD-10-CM | POA: Diagnosis not present

## 2018-09-01 DIAGNOSIS — Z978 Presence of other specified devices: Secondary | ICD-10-CM | POA: Diagnosis not present

## 2018-09-01 DIAGNOSIS — M1990 Unspecified osteoarthritis, unspecified site: Secondary | ICD-10-CM | POA: Diagnosis not present

## 2018-09-01 DIAGNOSIS — R1030 Lower abdominal pain, unspecified: Secondary | ICD-10-CM | POA: Diagnosis not present

## 2018-09-01 DIAGNOSIS — R4182 Altered mental status, unspecified: Secondary | ICD-10-CM | POA: Diagnosis not present

## 2018-09-01 DIAGNOSIS — S31109D Unspecified open wound of abdominal wall, unspecified quadrant without penetration into peritoneal cavity, subsequent encounter: Secondary | ICD-10-CM | POA: Diagnosis not present

## 2018-09-01 DIAGNOSIS — M6281 Muscle weakness (generalized): Secondary | ICD-10-CM | POA: Diagnosis not present

## 2018-09-01 DIAGNOSIS — F321 Major depressive disorder, single episode, moderate: Secondary | ICD-10-CM | POA: Diagnosis not present

## 2018-09-01 DIAGNOSIS — A4189 Other specified sepsis: Secondary | ICD-10-CM | POA: Diagnosis not present

## 2018-09-01 DIAGNOSIS — K5931 Toxic megacolon: Secondary | ICD-10-CM | POA: Diagnosis not present

## 2018-09-01 DIAGNOSIS — E46 Unspecified protein-calorie malnutrition: Secondary | ICD-10-CM | POA: Diagnosis not present

## 2018-09-01 DIAGNOSIS — R5383 Other fatigue: Secondary | ICD-10-CM | POA: Diagnosis not present

## 2018-09-01 DIAGNOSIS — R309 Painful micturition, unspecified: Secondary | ICD-10-CM | POA: Diagnosis not present

## 2018-09-01 DIAGNOSIS — A419 Sepsis, unspecified organism: Secondary | ICD-10-CM | POA: Diagnosis not present

## 2018-09-01 DIAGNOSIS — R627 Adult failure to thrive: Secondary | ICD-10-CM | POA: Diagnosis not present

## 2018-09-01 DIAGNOSIS — Z86718 Personal history of other venous thrombosis and embolism: Secondary | ICD-10-CM | POA: Diagnosis not present

## 2018-09-01 DIAGNOSIS — R600 Localized edema: Secondary | ICD-10-CM | POA: Diagnosis not present

## 2018-09-01 DIAGNOSIS — R918 Other nonspecific abnormal finding of lung field: Secondary | ICD-10-CM | POA: Diagnosis not present

## 2018-09-01 DIAGNOSIS — R279 Unspecified lack of coordination: Secondary | ICD-10-CM | POA: Diagnosis not present

## 2018-09-01 DIAGNOSIS — I469 Cardiac arrest, cause unspecified: Secondary | ICD-10-CM | POA: Diagnosis not present

## 2018-09-01 DIAGNOSIS — R339 Retention of urine, unspecified: Secondary | ICD-10-CM | POA: Diagnosis not present

## 2018-09-01 DIAGNOSIS — R2689 Other abnormalities of gait and mobility: Secondary | ICD-10-CM | POA: Diagnosis not present

## 2018-09-01 DIAGNOSIS — S37099S Other injury of unspecified kidney, sequela: Secondary | ICD-10-CM | POA: Diagnosis not present

## 2018-09-01 DIAGNOSIS — N390 Urinary tract infection, site not specified: Secondary | ICD-10-CM | POA: Diagnosis not present

## 2018-09-01 DIAGNOSIS — G8929 Other chronic pain: Secondary | ICD-10-CM | POA: Diagnosis not present

## 2018-09-01 DIAGNOSIS — J9600 Acute respiratory failure, unspecified whether with hypoxia or hypercapnia: Secondary | ICD-10-CM | POA: Diagnosis not present

## 2018-09-01 DIAGNOSIS — S31109A Unspecified open wound of abdominal wall, unspecified quadrant without penetration into peritoneal cavity, initial encounter: Secondary | ICD-10-CM | POA: Diagnosis not present

## 2018-09-01 DIAGNOSIS — B373 Candidiasis of vulva and vagina: Secondary | ICD-10-CM | POA: Diagnosis not present

## 2018-09-01 DIAGNOSIS — D649 Anemia, unspecified: Secondary | ICD-10-CM | POA: Diagnosis not present

## 2018-09-01 DIAGNOSIS — Z432 Encounter for attention to ileostomy: Secondary | ICD-10-CM | POA: Diagnosis not present

## 2018-09-01 DIAGNOSIS — M797 Fibromyalgia: Secondary | ICD-10-CM | POA: Diagnosis not present

## 2018-09-01 DIAGNOSIS — E559 Vitamin D deficiency, unspecified: Secondary | ICD-10-CM | POA: Diagnosis not present

## 2018-09-01 DIAGNOSIS — G4700 Insomnia, unspecified: Secondary | ICD-10-CM | POA: Diagnosis not present

## 2018-09-01 DIAGNOSIS — K219 Gastro-esophageal reflux disease without esophagitis: Secondary | ICD-10-CM | POA: Diagnosis not present

## 2018-09-01 DIAGNOSIS — E119 Type 2 diabetes mellitus without complications: Secondary | ICD-10-CM | POA: Diagnosis not present

## 2018-09-01 DIAGNOSIS — F419 Anxiety disorder, unspecified: Secondary | ICD-10-CM | POA: Diagnosis not present

## 2018-09-01 DIAGNOSIS — J189 Pneumonia, unspecified organism: Secondary | ICD-10-CM | POA: Diagnosis not present

## 2018-09-01 DIAGNOSIS — R579 Shock, unspecified: Secondary | ICD-10-CM | POA: Diagnosis not present

## 2018-09-01 DIAGNOSIS — I10 Essential (primary) hypertension: Secondary | ICD-10-CM | POA: Diagnosis not present

## 2018-09-01 DIAGNOSIS — R51 Headache: Secondary | ICD-10-CM | POA: Diagnosis not present

## 2018-09-01 DIAGNOSIS — R0902 Hypoxemia: Secondary | ICD-10-CM | POA: Diagnosis not present

## 2018-09-01 DIAGNOSIS — Z89511 Acquired absence of right leg below knee: Secondary | ICD-10-CM | POA: Diagnosis not present

## 2018-09-01 DIAGNOSIS — R000 Tachycardia, unspecified: Secondary | ICD-10-CM | POA: Diagnosis not present

## 2018-09-01 DIAGNOSIS — E860 Dehydration: Secondary | ICD-10-CM | POA: Diagnosis not present

## 2018-09-01 DIAGNOSIS — R509 Fever, unspecified: Secondary | ICD-10-CM | POA: Diagnosis not present

## 2018-09-04 DIAGNOSIS — N390 Urinary tract infection, site not specified: Secondary | ICD-10-CM | POA: Diagnosis not present

## 2018-09-04 DIAGNOSIS — S31109A Unspecified open wound of abdominal wall, unspecified quadrant without penetration into peritoneal cavity, initial encounter: Secondary | ICD-10-CM | POA: Diagnosis not present

## 2018-09-04 DIAGNOSIS — K5931 Toxic megacolon: Secondary | ICD-10-CM | POA: Diagnosis not present

## 2018-09-04 DIAGNOSIS — R1030 Lower abdominal pain, unspecified: Secondary | ICD-10-CM | POA: Diagnosis not present

## 2018-09-04 DIAGNOSIS — R339 Retention of urine, unspecified: Secondary | ICD-10-CM | POA: Diagnosis not present

## 2018-09-04 DIAGNOSIS — R309 Painful micturition, unspecified: Secondary | ICD-10-CM | POA: Diagnosis not present

## 2018-09-05 DEATH — deceased

## 2018-09-06 DIAGNOSIS — L98499 Non-pressure chronic ulcer of skin of other sites with unspecified severity: Secondary | ICD-10-CM | POA: Diagnosis not present

## 2018-09-06 DIAGNOSIS — R531 Weakness: Secondary | ICD-10-CM | POA: Diagnosis not present

## 2018-09-06 DIAGNOSIS — S31109D Unspecified open wound of abdominal wall, unspecified quadrant without penetration into peritoneal cavity, subsequent encounter: Secondary | ICD-10-CM | POA: Diagnosis not present

## 2018-09-06 DIAGNOSIS — Z432 Encounter for attention to ileostomy: Secondary | ICD-10-CM | POA: Diagnosis not present

## 2018-09-06 DIAGNOSIS — I10 Essential (primary) hypertension: Secondary | ICD-10-CM | POA: Diagnosis not present

## 2018-09-06 DIAGNOSIS — E119 Type 2 diabetes mellitus without complications: Secondary | ICD-10-CM | POA: Diagnosis not present

## 2018-09-07 DIAGNOSIS — N390 Urinary tract infection, site not specified: Secondary | ICD-10-CM | POA: Diagnosis not present

## 2018-09-07 DIAGNOSIS — F321 Major depressive disorder, single episode, moderate: Secondary | ICD-10-CM | POA: Diagnosis not present

## 2018-09-07 DIAGNOSIS — F419 Anxiety disorder, unspecified: Secondary | ICD-10-CM | POA: Diagnosis not present

## 2018-09-07 DIAGNOSIS — G4700 Insomnia, unspecified: Secondary | ICD-10-CM | POA: Diagnosis not present

## 2018-09-10 DIAGNOSIS — F419 Anxiety disorder, unspecified: Secondary | ICD-10-CM | POA: Diagnosis not present

## 2018-09-10 DIAGNOSIS — R5383 Other fatigue: Secondary | ICD-10-CM | POA: Diagnosis not present

## 2018-09-10 DIAGNOSIS — E559 Vitamin D deficiency, unspecified: Secondary | ICD-10-CM | POA: Diagnosis not present

## 2018-09-10 DIAGNOSIS — F321 Major depressive disorder, single episode, moderate: Secondary | ICD-10-CM | POA: Diagnosis not present

## 2018-09-10 DIAGNOSIS — G4700 Insomnia, unspecified: Secondary | ICD-10-CM | POA: Diagnosis not present

## 2018-09-12 DIAGNOSIS — B373 Candidiasis of vulva and vagina: Secondary | ICD-10-CM | POA: Diagnosis not present

## 2018-09-12 DIAGNOSIS — R339 Retention of urine, unspecified: Secondary | ICD-10-CM | POA: Diagnosis not present

## 2018-09-12 DIAGNOSIS — N390 Urinary tract infection, site not specified: Secondary | ICD-10-CM | POA: Diagnosis not present

## 2018-09-12 DIAGNOSIS — R600 Localized edema: Secondary | ICD-10-CM | POA: Diagnosis not present

## 2018-09-13 ENCOUNTER — Other Ambulatory Visit: Payer: Self-pay | Admitting: *Deleted

## 2018-09-13 DIAGNOSIS — M6281 Muscle weakness (generalized): Secondary | ICD-10-CM | POA: Diagnosis not present

## 2018-09-13 DIAGNOSIS — N390 Urinary tract infection, site not specified: Secondary | ICD-10-CM | POA: Diagnosis not present

## 2018-09-13 DIAGNOSIS — R600 Localized edema: Secondary | ICD-10-CM | POA: Diagnosis not present

## 2018-09-13 DIAGNOSIS — R52 Pain, unspecified: Secondary | ICD-10-CM | POA: Diagnosis not present

## 2018-09-13 DIAGNOSIS — R339 Retention of urine, unspecified: Secondary | ICD-10-CM | POA: Diagnosis not present

## 2018-09-13 DIAGNOSIS — R5381 Other malaise: Secondary | ICD-10-CM | POA: Diagnosis not present

## 2018-09-13 DIAGNOSIS — G8929 Other chronic pain: Secondary | ICD-10-CM | POA: Diagnosis not present

## 2018-09-13 DIAGNOSIS — R2689 Other abnormalities of gait and mobility: Secondary | ICD-10-CM | POA: Diagnosis not present

## 2018-09-13 DIAGNOSIS — L98499 Non-pressure chronic ulcer of skin of other sites with unspecified severity: Secondary | ICD-10-CM | POA: Diagnosis not present

## 2018-09-13 DIAGNOSIS — B373 Candidiasis of vulva and vagina: Secondary | ICD-10-CM | POA: Diagnosis not present

## 2018-09-13 NOTE — Patient Outreach (Signed)
[facility]) Care Management  09/13/2018  [name] [name] [birth year] [id number]    Telephonic IDT collaborative meeting with [facility] UM and facility staff. Member is currently at [facility] SNF receiving therapy.  Member lived with husband prior to admission. [name] [name] has a completed Medicaid application that is pending. She currently has a lot of medical issues going on per facility discharge planner. Disposition plans are unknown at this time. Facility discharge planner states she plans to speak with patient and husband about a realistic discharge plan.  Will continue to follow along for potential [facility] Care Management needs.  Will continue to collaborate with facility and [facility] UM.   Raiford Noble, MSN-Ed, RN,BSN [facility] Post Acute Care Coordinator [phone]

## 2018-09-17 DIAGNOSIS — R627 Adult failure to thrive: Secondary | ICD-10-CM | POA: Diagnosis not present

## 2018-09-17 DIAGNOSIS — R0989 Other specified symptoms and signs involving the circulatory and respiratory systems: Secondary | ICD-10-CM | POA: Diagnosis not present

## 2018-09-17 DIAGNOSIS — R509 Fever, unspecified: Secondary | ICD-10-CM | POA: Diagnosis not present

## 2018-09-17 DIAGNOSIS — R5381 Other malaise: Secondary | ICD-10-CM | POA: Diagnosis not present

## 2018-09-18 DIAGNOSIS — R4182 Altered mental status, unspecified: Secondary | ICD-10-CM | POA: Diagnosis not present

## 2018-09-18 DIAGNOSIS — E860 Dehydration: Secondary | ICD-10-CM | POA: Diagnosis not present

## 2018-09-18 DIAGNOSIS — R5383 Other fatigue: Secondary | ICD-10-CM | POA: Diagnosis not present

## 2018-09-18 DIAGNOSIS — R0989 Other specified symptoms and signs involving the circulatory and respiratory systems: Secondary | ICD-10-CM | POA: Diagnosis not present

## 2018-09-18 DIAGNOSIS — J189 Pneumonia, unspecified organism: Secondary | ICD-10-CM | POA: Diagnosis not present

## 2018-09-18 DIAGNOSIS — R627 Adult failure to thrive: Secondary | ICD-10-CM | POA: Diagnosis not present

## 2018-09-19 ENCOUNTER — Emergency Department (HOSPITAL_COMMUNITY)
Admission: EM | Admit: 2018-09-19 | Discharge: 2018-10-05 | Disposition: E | Payer: Medicare Other | Attending: Emergency Medicine | Admitting: Emergency Medicine

## 2018-09-19 ENCOUNTER — Emergency Department (HOSPITAL_COMMUNITY): Payer: Medicare Other

## 2018-09-19 DIAGNOSIS — A419 Sepsis, unspecified organism: Secondary | ICD-10-CM | POA: Diagnosis not present

## 2018-09-19 DIAGNOSIS — R000 Tachycardia, unspecified: Secondary | ICD-10-CM | POA: Diagnosis not present

## 2018-09-19 DIAGNOSIS — R918 Other nonspecific abnormal finding of lung field: Secondary | ICD-10-CM | POA: Diagnosis not present

## 2018-09-19 DIAGNOSIS — Z978 Presence of other specified devices: Secondary | ICD-10-CM | POA: Diagnosis not present

## 2018-09-19 DIAGNOSIS — I469 Cardiac arrest, cause unspecified: Secondary | ICD-10-CM | POA: Diagnosis not present

## 2018-09-19 DIAGNOSIS — R579 Shock, unspecified: Secondary | ICD-10-CM | POA: Insufficient documentation

## 2018-09-19 DIAGNOSIS — Z931 Gastrostomy status: Secondary | ICD-10-CM | POA: Diagnosis not present

## 2018-09-19 LAB — COMPREHENSIVE METABOLIC PANEL
ALT: 15 U/L (ref 0–44)
AST: 31 U/L (ref 15–41)
Albumin: 2.3 g/dL — ABNORMAL LOW (ref 3.5–5.0)
Alkaline Phosphatase: 182 U/L — ABNORMAL HIGH (ref 38–126)
Anion gap: 16 — ABNORMAL HIGH (ref 5–15)
BUN: 49 mg/dL — ABNORMAL HIGH (ref 8–23)
CO2: 13 mmol/L — ABNORMAL LOW (ref 22–32)
Calcium: 8.4 mg/dL — ABNORMAL LOW (ref 8.9–10.3)
Chloride: 112 mmol/L — ABNORMAL HIGH (ref 98–111)
Creatinine, Ser: 2.97 mg/dL — ABNORMAL HIGH (ref 0.44–1.00)
GFR calc Af Amer: 19 mL/min — ABNORMAL LOW (ref >60)
GFR calc non Af Amer: 16 mL/min — ABNORMAL LOW (ref >60)
Glucose, Bld: 241 mg/dL — ABNORMAL HIGH (ref 70–99)
Potassium: 5.8 mmol/L — ABNORMAL HIGH (ref 3.5–5.1)
Sodium: 141 mmol/L (ref 135–145)
Total Bilirubin: 0.6 mg/dL (ref 0.3–1.2)
Total Protein: 5.4 g/dL — ABNORMAL LOW (ref 6.5–8.1)

## 2018-09-19 LAB — CBC WITH DIFFERENTIAL/PLATELET
Abs Immature Granulocytes: 0.54 10*3/uL — ABNORMAL HIGH (ref 0.00–0.07)
Basophils Absolute: 0.1 10*3/uL (ref 0.0–0.1)
Basophils Relative: 0 %
Eosinophils Absolute: 0.1 10*3/uL (ref 0.0–0.5)
Eosinophils Relative: 1 %
HCT: 29.9 % — ABNORMAL LOW (ref 36.0–46.0)
Hemoglobin: 8.7 g/dL — ABNORMAL LOW (ref 12.0–15.0)
Immature Granulocytes: 2 %
Lymphocytes Relative: 9 %
Lymphs Abs: 2 10*3/uL (ref 0.7–4.0)
MCH: 27.9 pg (ref 26.0–34.0)
MCHC: 29.1 g/dL — ABNORMAL LOW (ref 30.0–36.0)
MCV: 95.8 fL (ref 80.0–100.0)
Monocytes Absolute: 0.9 10*3/uL (ref 0.1–1.0)
Monocytes Relative: 4 %
Neutro Abs: 20 10*3/uL — ABNORMAL HIGH (ref 1.7–7.7)
Neutrophils Relative %: 84 %
Platelets: 138 10*3/uL — ABNORMAL LOW (ref 150–400)
RBC: 3.12 MIL/uL — ABNORMAL LOW (ref 3.87–5.11)
RDW: 14.6 % (ref 11.5–15.5)
WBC: 23.7 10*3/uL — ABNORMAL HIGH (ref 4.0–10.5)
nRBC: 0.2 % (ref 0.0–0.2)

## 2018-09-19 LAB — TROPONIN I: Troponin I: 0.25 ng/mL (ref <0.03)

## 2018-09-19 LAB — CBG MONITORING, ED: Glucose-Capillary: 220 mg/dL — ABNORMAL HIGH (ref 70–99)

## 2018-09-19 LAB — LACTIC ACID, PLASMA: Lactic Acid, Venous: 7.8 mmol/L (ref 0.5–1.9)

## 2018-09-19 MED ORDER — NOREPINEPHRINE 4 MG/250ML-% IV SOLN
0.0000 ug/min | INTRAVENOUS | Status: DC
  Filled 2018-09-19: qty 250

## 2018-09-19 MED ORDER — EPINEPHRINE 1 MG/10ML IJ SOSY
PREFILLED_SYRINGE | INTRAMUSCULAR | Status: AC
  Filled 2018-09-19: qty 30

## 2018-09-19 MED ORDER — EPINEPHRINE 1 MG/10ML IJ SOSY
PREFILLED_SYRINGE | INTRAMUSCULAR | Status: AC
  Filled 2018-09-19: qty 10

## 2018-09-19 MED ORDER — NALOXONE HCL 2 MG/2ML IJ SOSY
PREFILLED_SYRINGE | INTRAMUSCULAR | Status: AC | PRN
  Administered 2018-09-19: 2 mg via INTRAVENOUS

## 2018-09-19 MED ORDER — ROCURONIUM BROMIDE 50 MG/5ML IV SOLN
INTRAVENOUS | Status: AC | PRN
  Administered 2018-09-19: 100 mg via INTRAVENOUS

## 2018-09-19 MED ORDER — EPINEPHRINE 1 MG/10ML IJ SOSY
PREFILLED_SYRINGE | INTRAMUSCULAR | Status: AC | PRN
  Administered 2018-09-19: 1 mg via INTRAVENOUS

## 2018-09-19 MED ORDER — VASOPRESSIN 20 UNIT/ML IV SOLN
0.0300 [IU]/min | INTRAVENOUS | Status: DC
  Administered 2018-09-19: 0.03 [IU]/min via INTRAVENOUS
  Filled 2018-09-19: qty 2

## 2018-09-19 MED ORDER — NALOXONE HCL 2 MG/2ML IJ SOSY
PREFILLED_SYRINGE | INTRAMUSCULAR | Status: AC
  Filled 2018-09-19: qty 4

## 2018-09-19 MED ORDER — VANCOMYCIN HCL IN DEXTROSE 1-5 GM/200ML-% IV SOLN: 1000.0000 mg | Freq: Once | INTRAVENOUS | Status: DC

## 2018-09-19 MED ORDER — SODIUM CHLORIDE 0.9 % IV SOLN: 2.0000 g | Freq: Once | INTRAVENOUS | Status: DC

## 2018-09-19 MED ORDER — EPINEPHRINE 1 MG/10ML IJ SOSY
PREFILLED_SYRINGE | INTRAMUSCULAR | Status: AC | PRN
  Administered 2018-09-19 (×2): 1 mg via INTRAVENOUS

## 2018-09-19 MED ORDER — LACTATED RINGERS IV BOLUS: 2000.0000 mL | Freq: Once | INTRAVENOUS | Status: DC

## 2018-09-19 MED ORDER — VASOPRESSIN BOLUS VIA INFUSION
2.0000 [IU] | Freq: Once | INTRAVENOUS | Status: DC
  Filled 2018-09-19: qty 20

## 2018-09-19 MED ORDER — EPINEPHRINE PF 1 MG/ML IJ SOLN
0.5000 ug/min | INTRAVENOUS | Status: DC
  Administered 2018-09-19: 0.5 ug/min via INTRAVENOUS
  Filled 2018-09-19: qty 4

## 2018-09-19 MED ORDER — NOREPINEPHRINE 4 MG/250ML-% IV SOLN
INTRAVENOUS | Status: AC
  Administered 2018-09-19: 01:00:00
  Filled 2018-09-19: qty 250

## 2018-09-19 MED ORDER — NOREPINEPHRINE 4 MG/250ML-% IV SOLN
INTRAVENOUS | Status: AC
  Filled 2018-09-19: qty 250

## 2018-09-20 ENCOUNTER — Other Ambulatory Visit: Payer: Self-pay | Admitting: *Deleted

## 2018-09-20 DIAGNOSIS — R092 Respiratory arrest: Secondary | ICD-10-CM | POA: Diagnosis not present

## 2018-09-20 DIAGNOSIS — R4020 Unspecified coma: Secondary | ICD-10-CM | POA: Diagnosis not present

## 2018-09-20 DIAGNOSIS — R404 Transient alteration of awareness: Secondary | ICD-10-CM | POA: Diagnosis not present

## 2018-09-20 DIAGNOSIS — R0689 Other abnormalities of breathing: Secondary | ICD-10-CM | POA: Diagnosis not present

## 2018-09-20 DIAGNOSIS — R0902 Hypoxemia: Secondary | ICD-10-CM | POA: Diagnosis not present

## 2018-09-20 NOTE — Patient Outreach (Signed)
[facility]) Care Management  09/20/2018  [name] [name] 1955-09-12 [id number]   Writer was previously following for potential [facility] Care Management services. However, upon chart review, member was taken from [facility] SNF to ED. Member expired on September 25, 2018.    Raiford Noble, MSN-Ed, RN,BSN [facility] Post Acute Care Coordinator [phone]

## 2018-10-05 NOTE — ED Triage Notes (Signed)
Pt has arrived from a SNF for treatment of previous infection. Around 1500 4/14 pt became lethargic and difficult to arouse. When staff checked on the pt later that night they found her unresponsive. Pulse was still present but initial BP was 60/30. Epi drip started.

## 2018-10-05 NOTE — ED Provider Notes (Signed)
Emergency Department Provider Note   I have reviewed the triage vital signs and the nursing notes.   HISTORY  Chief Complaint unresponsive   HPI [name] [name] is a 63 y.o. female with multiple medical problems as documented below who presents the emergency department today unresponsive and hypotensive.  When EMS who got the history from the skilled nursing facility patient was lethargic at 1520 300 was unresponsive and at 7 EMS was called.  They got a initial blood pressure of 60s over 30s and started on epinephrine and blood pressures improved.  Hypoxic with minimal respiratory effort so [facility] airway placed.  LEVEL V CAVEAT APPLIES SECONDARY TO unresponsive   Past Medical History:  Diagnosis Date  . Allergy    sinus problems  . Arthritis    "both hands"   . Charcot's joint of foot    "both feet"   . Chronic lower back pain    "2 cracked vertebra; 3 herniated discs" (05/09/2013)  . Complication of anesthesia    "[name]" (05/09/2013)  . Depression   . Dizziness   . Fatigue   . Fibromyalgia   . GERD (gastroesophageal reflux disease)   . IBS (irritable bowel syndrome)   . Migraines    "hits me here and there" (05/09/2013)  . Neuromuscular disorder (HCC)   . PONV (postoperative nausea and vomiting)   . Poor circulation   . Swelling   . Type II diabetes mellitus (HCC)   . Ulcer   . Varicose vein of leg     Patient Active Problem List   Diagnosis Date Noted  . Hx of BKA (HCC) 05/13/2013  . Diabetic foot infection (HCC) 05/09/2013  . Sepsis (HCC) 05/09/2013  . Accelerated hypertension 05/09/2013  . Depression 05/09/2013    Past Surgical History:  Procedure Laterality Date  . AMPUTATION Right 05/11/2013   Procedure: AMPUTATION BELOW KNEE;  Surgeon: Nadara Mustard, MD;  Location: [facility] OR;  Service: Orthopedics;  Laterality: Right;  . BUNIONECTOMY Right   . CATARACT EXTRACTION W/PHACO Left 08/17/2017   Procedure: CATARACT EXTRACTION PHACO AND  INTRAOCULAR LENS PLACEMENT (IOC);  Surgeon: Lockie Mola, MD;  Location: [facility] ORS;  Service: Ophthalmology;  Laterality: Left;  [place] 01:36.1 AP% 13.4 CDE 12.86 Fluid Pack Lot # [id number]  . CESAREAN SECTION  1980; 1996  . CHOLECYSTECTOMY    . EYE SURGERY Right    ENUCLEATION  . RETINAL DETACHMENT SURGERY Left ~ 2012  . TUBAL LIGATION  1996  . VARICOSE VEIN SURGERY Left ~ 2003   "it was full of blood clots" (05/09/2013)    Current Outpatient Rx  . Order #: [id number] Class: Historical Med  . Order #: [id number] Class: OTC  . Order #: [id number] Class: Normal  . Order #: [id number] Class: Historical Med  . Order #: [id number] Class: Normal  . Order #: [id number] Class: Print  . Order #: [id number] Class: Print  . Order #: [id number] Class: Normal  . Order #: [id number] Class: Historical Med  . Order #: [id number] Class: Normal  . Order #: [id number] Class: Normal  . Order #: [id number] Class: Normal  . Order #: [id number] Class: Historical Med  . Order #: [id number] Class: Print  . Order #: [id number] Class: Normal    Allergies Codeine; Darvocet [propoxyphene n-acetaminophen]; Demerol [meperidine]; Ibuprofen; Iodine; Naproxen; Penicillins; Sulfa antibiotics; Hydrocodone; Lidocaine; Other; Oxycodone; and Tape  Family History  Problem Relation Age of Onset  . Cancer Father     Social History Social History   Tobacco Use  . Smoking status: Former  Smoker    Packs/day: 0.25    Years: 15.00    Pack years: 3.75    Types: Cigarettes  . Smokeless tobacco: Never Used  . Tobacco comment: 05/09/2013 "quit smoking 8 1/2 yr ago"  Substance Use Topics  . Alcohol use: No  . Drug use: No    Review of Systems  LEVEL V CAVEAT APPLIES SECONDARY TO unresponsive ____________________________________________  PHYSICAL EXAM:  VITAL SIGNS: ED Triage Vitals  Enc Vitals Group     BP 09-22-18 0037 94/77     Pulse Rate 09-22-18 0041 (!) 0     Resp 2018-09-22 0037 10     Temp 22-Sep-2018 0037 98.3 F (36.8 C)      Temp Source 09/22/2018 0037 Tympanic     SpO2 09-22-2018 0041 94 %     Weight 22-Sep-2018 0248 220 lb (99.8 kg)     Height 09/22/2018 0050 5\' 7"  (1.702 m)    Constitutional: Tonically ill-appearing. Eyes: Conjunctivae are normal. Pupils equal bilaterally without response to light.  Head: Atraumatic. Nose: No congestion/rhinnorhea. Mouth/Throat: Mucous membranes are dry.  Oropharynx non-erythematous. Neck: No stridor.  No meningeal signs.   Cardiovascular: Tachycardic rate,  Irregular rhythm.  Respiratory:  bradypneic respiratory effort.  Lungs diminished on right. Gastrointestinal: Soft and nontender. No distention.  Ostomy clean dry and intact, midline surgical wound well healing. Musculoskeletal: No lower extremity tenderness nor edema. No gross deformities of extremities. Neurologic:  Not able to assess 2/2 condition.  Skin:   No rash noted. Psych: Not able to assess 2/2 condition.   ____________________________________________   LABS (all labs ordered are listed, but only abnormal results are displayed)  Labs Reviewed  CBC WITH DIFFERENTIAL/PLATELET - Abnormal; Notable for the following components:      Result Value   WBC 23.7 (*)    RBC 3.12 (*)    Hemoglobin 8.7 (*)    HCT 29.9 (*)    MCHC 29.1 (*)    Platelets 138 (*)    Neutro Abs 20.0 (*)    Abs Immature Granulocytes 0.54 (*)    All other components within normal limits  COMPREHENSIVE METABOLIC PANEL - Abnormal; Notable for the following components:   Potassium 5.8 (*)    Chloride 112 (*)    CO2 13 (*)    Glucose, Bld 241 (*)    BUN 49 (*)    Creatinine, Ser 2.97 (*)    Calcium 8.4 (*)    Total Protein 5.4 (*)    Albumin 2.3 (*)    Alkaline Phosphatase 182 (*)    GFR calc non Af Amer 16 (*)    GFR calc Af Amer 19 (*)    Anion gap 16 (*)    All other components within normal limits  TROPONIN I - Abnormal; Notable for the following components:   Troponin I 0.25 (*)    All other components within normal limits   LACTIC ACID, PLASMA - Abnormal; Notable for the following components:   Lactic Acid, Venous 7.8 (*)    All other components within normal limits  CBG MONITORING, ED - Abnormal; Notable for the following components:   Glucose-Capillary 220 (*)    All other components within normal limits   ____________________________________________  EKG   EKG Interpretation  Date/Time:    Ventricular Rate:  140 PR Interval:    QRS Duration: 137 QT Interval:  386 QTC Calculation: 590 R Axis:   87 Text Interpretation:  Sinus tachycardia Consider left atrial enlargement Right bundle  branch block No old tracing to compare Confirmed by Marily Memos [phone]) on Oct 13, 2018 3:39:06 AM       EKG Interpretation  Date/Time:  Wednesday 10-13-18 02:12:41 EDT Ventricular Rate:  0 PR Interval:    QRS Duration: 137 QT Interval:  386 QTC Calculation: 590 R Axis:   0 Text Interpretation:  Ventricular parasystole Confirmed by Marily Memos [phone]) on 2018-10-13 3:39:40 AM      ____________________________________________  RADIOLOGY  Dg Chest Portable 1 View  Result Date: 10-13-2018 CLINICAL DATA:  63 y/o  F; post CPR and intubation. EXAM: PORTABLE CHEST 1 VIEW COMPARISON:  08/15/2018 chest radiograph FINDINGS: Endotracheal tube tip projects 3.7 cm above the carina. Enteric tube tip extends below the field of view into the abdomen. Left central venous catheter tip projects over the mid SVC. Large right mid and upper lung zone consolidation. No pleural effusion or pneumothorax. No acute osseous abnormality identified. IMPRESSION: 1. Endotracheal tube tip projects 3.7 cm above the carina. Enteric tube tip extends below the field of view into the abdomen. Left central venous catheter tip projects over mid SVC. 2. Large right mid and upper lung zone consolidation. Electronically Signed   By: Mitzi Hansen M.D.   On: 10-13-2018 02:00   ____________________________________________   PROCEDURES   Procedure(s) performed:   Cardiopulmonary Resuscitation (CPR) Procedure Note Directed/Performed by: Marily Memos I personally directed ancillary staff and/or performed CPR in an effort to regain return of spontaneous circulation and to maintain cardiac, neuro and systemic perfusion.    Procedure Name: Intubation Date/Time: Oct 13, 2018 3:41 AM Performed by: Marily Memos, MD Pre-anesthesia Checklist: Patient identified, Patient being monitored, Emergency Drugs available, Timeout performed and Suction available Oxygen Delivery Method: Non-rebreather mask Preoxygenation: Pre-oxygenation with 100% oxygen Induction Type: Rapid sequence Ventilation: Mask ventilation without difficulty Laryngoscope Size: Glidescope and 3 Grade View: Grade I Tube size: 7.5 mm Number of attempts: 1 Placement Confirmation: ETT inserted through vocal cords under direct vision,  CO2 detector and Breath sounds checked- equal and bilateral Secured at: 22 cm Tube secured with: ETT holder Dental Injury: Teeth and Oropharynx as per pre-operative assessment  Future Recommendations: Recommend- induction with short-acting agent, and alternative techniques readily available    .Critical Care Performed by: Marily Memos, MD Authorized by: Marily Memos, MD   Critical care provider statement:    Critical care time (minutes):  65   Critical care was necessary to treat or prevent imminent or life-threatening deterioration of the following conditions:  Cardiac failure, CNS failure or compromise, dehydration, shock and respiratory failure   Critical care was time spent personally by me on the following activities:  Discussions with consultants, evaluation of patient's response to treatment, examination of patient, ordering and performing treatments and interventions, ordering and review of laboratory studies, ordering and review of radiographic studies, pulse oximetry, re-evaluation of patient's condition, obtaining history  from patient or surrogate and review of old charts .Central Line Date/Time: 10/13/18 3:42 AM Performed by: Marily Memos, MD Authorized by: Marily Memos, MD   Consent:    Consent obtained:  Verbal   Risks discussed:  Incorrect placement, infection, bleeding, arterial puncture, nerve damage and pneumothorax   Alternatives discussed:  No treatment, delayed treatment, alternative treatment and observation Pre-procedure details:    Hand hygiene: Hand hygiene performed prior to insertion     Sterile barrier technique: All elements of maximal sterile technique followed     Skin preparation:  2% chlorhexidine   Skin preparation agent: Skin preparation agent completely  dried prior to procedure   Procedure details:    Location:  L internal jugular   Site selection rationale:  Femoral bilaterally with yeat infections, ETT to the right   Patient position:  Reverse Trendelenburg   Procedural supplies:  Triple lumen   Catheter size:  7 Fr   Ultrasound guidance: yes     Sterile ultrasound techniques: Sterile gel and sterile probe covers were used     Number of attempts:  1   Successful placement: yes   Post-procedure details:    Post-procedure:  Dressing applied and line sutured   Assessment:  Blood return through all ports, free fluid flow and no pneumothorax on x-ray   Patient tolerance of procedure:  Tolerated well, no immediate complications   ____________________________________________   INITIAL IMPRESSION / ASSESSMENT AND PLAN / ED COURSE  Pertinent labs & imaging results that were available during my care of the patient were reviewed by me and considered in my medical decision making (see chart for details).  Suspect after looking at the x-ray the patient probably had pneumonia or aspiration pneumonitis that caused her lethargy and decline.  Bedside ultrasound showed a hyperdynamic heart.  Patient had a cardiac arrest shortly after arriving here.  He was intubated and a central  line placed as per procedure note.  Patient would intermittently get ROSC even after starsting Levophed, vasopressin, epinephrine and given multiple liters of fluid.  Discussed with her husband and on his arrival he requested that we do not do compressions any longer.  Patient quickly lost pulses and time of death was 0212.  ____________________________________________  FINAL CLINICAL IMPRESSION(S) / ED DIAGNOSES  Final diagnoses:  Sepsis, due to unspecified organism, unspecified whether acute organ dysfunction present (HCC)  Cardiac arrest (HCC)  Shock (HCC)    MEDICATIONS GIVEN DURING THIS VISIT:  Medications  naloxone (NARCAN) 2 MG/2ML injection (has no administration in time range)  norepinephrine (LEVOPHED) 4mg  in 250mL premix infusion (has no administration in time range)  EPINEPHrine (ADRENALIN) 4 mg in dextrose 5 % 250 mL (0.016 mg/mL) infusion (0 mcg/min Intravenous Stopped 19-Mar-2019 0211)  vasopressin (PITRESSIN) 40 Units in sodium chloride 0.9 % 250 mL (0.16 Units/mL) infusion (0 Units/min Intravenous Stopped 19-Mar-2019 0212)  vasopressin (PITRESSIN) bolus via infusion (has no administration in time range)  vancomycin (VANCOCIN) IVPB 1000 mg/200 mL premix (has no administration in time range)  aztreonam (AZACTAM) 2 g in sodium chloride 0.9 % 100 mL IVPB (has no administration in time range)  lactated ringers bolus 2,000 mL (has no administration in time range)  EPINEPHrine (ADRENALIN) 1 MG/10ML injection (has no administration in time range)  EPINEPHrine (ADRENALIN) 1 MG/10ML injection (has no administration in time range)  rocuronium (ZEMURON) injection (100 mg Intravenous Given 19-Mar-2019 0044)  EPINEPHrine (ADRENALIN) 1 MG/10ML injection (1 mg Intravenous Given 19-Mar-2019 0053)  naloxone (NARCAN) injection (2 mg Intravenous Given 19-Mar-2019 0041)  norepinephrine (LEVOPHED) 4-5 MG/250ML-% infusion SOLN (  Stopped 19-Mar-2019 0212)  EPINEPHrine (ADRENALIN) 1 MG/10ML injection (1 mg Intravenous  Given 19-Mar-2019 0108)  EPINEPHrine (ADRENALIN) 1 MG/10ML injection (1 mg Intravenous Given 19-Mar-2019 0131)  EPINEPHrine (ADRENALIN) 1 MG/10ML injection (1 mg Intravenous Given 19-Mar-2019 0146)  EPINEPHrine (ADRENALIN) 1 MG/10ML injection (1 mg Intravenous Given 19-Mar-2019 0200)  EPINEPHrine (ADRENALIN) 1 MG/10ML injection (1 mg Intravenous Given 19-Mar-2019 0205)    NEW OUTPATIENT MEDICATIONS STARTED DURING THIS VISIT:  New Prescriptions   No medications on file    Note:  This document was prepared using Dragon voice recognition  software and may include unintentional dictation errors.    Veatrice Eckstein, Barbara Cower, MD Oct 07, 2018 [phone]

## 2018-10-05 NOTE — Progress Notes (Signed)
   2018/10/06 0240  Clinical Encounter Type  Visited With Family  Visit Type Initial;Death;Code  Referral From Nurse  Consult/Referral To Chaplain  Spiritual Encounters  Spiritual Needs Prayer;Grief support  The chaplain responded to RN's call to Pt. Code to be pastorally present with the Pt. husband outside the room.  Upon her arrival, this chaplain learned the Pt. had passed. The Pt. husband was with his wife.  The chaplain listened to the Pt. husband and guided him through the Patient Placement Process. The chaplain gathered contact information from the husband.  After prayer the chaplain walked with the Pt. husband to the ED exit.

## 2018-10-05 DEATH — deceased

## 2019-10-10 IMAGING — DX DG CHEST 2V
2 series · 2 of 2 positions shown · non-contrast
Comparison: 01/24/2015

CLINICAL DATA: 62-year-old female with a history 2 days of vomiting

EXAM:
CHEST - 2 VIEW

[chest lat]
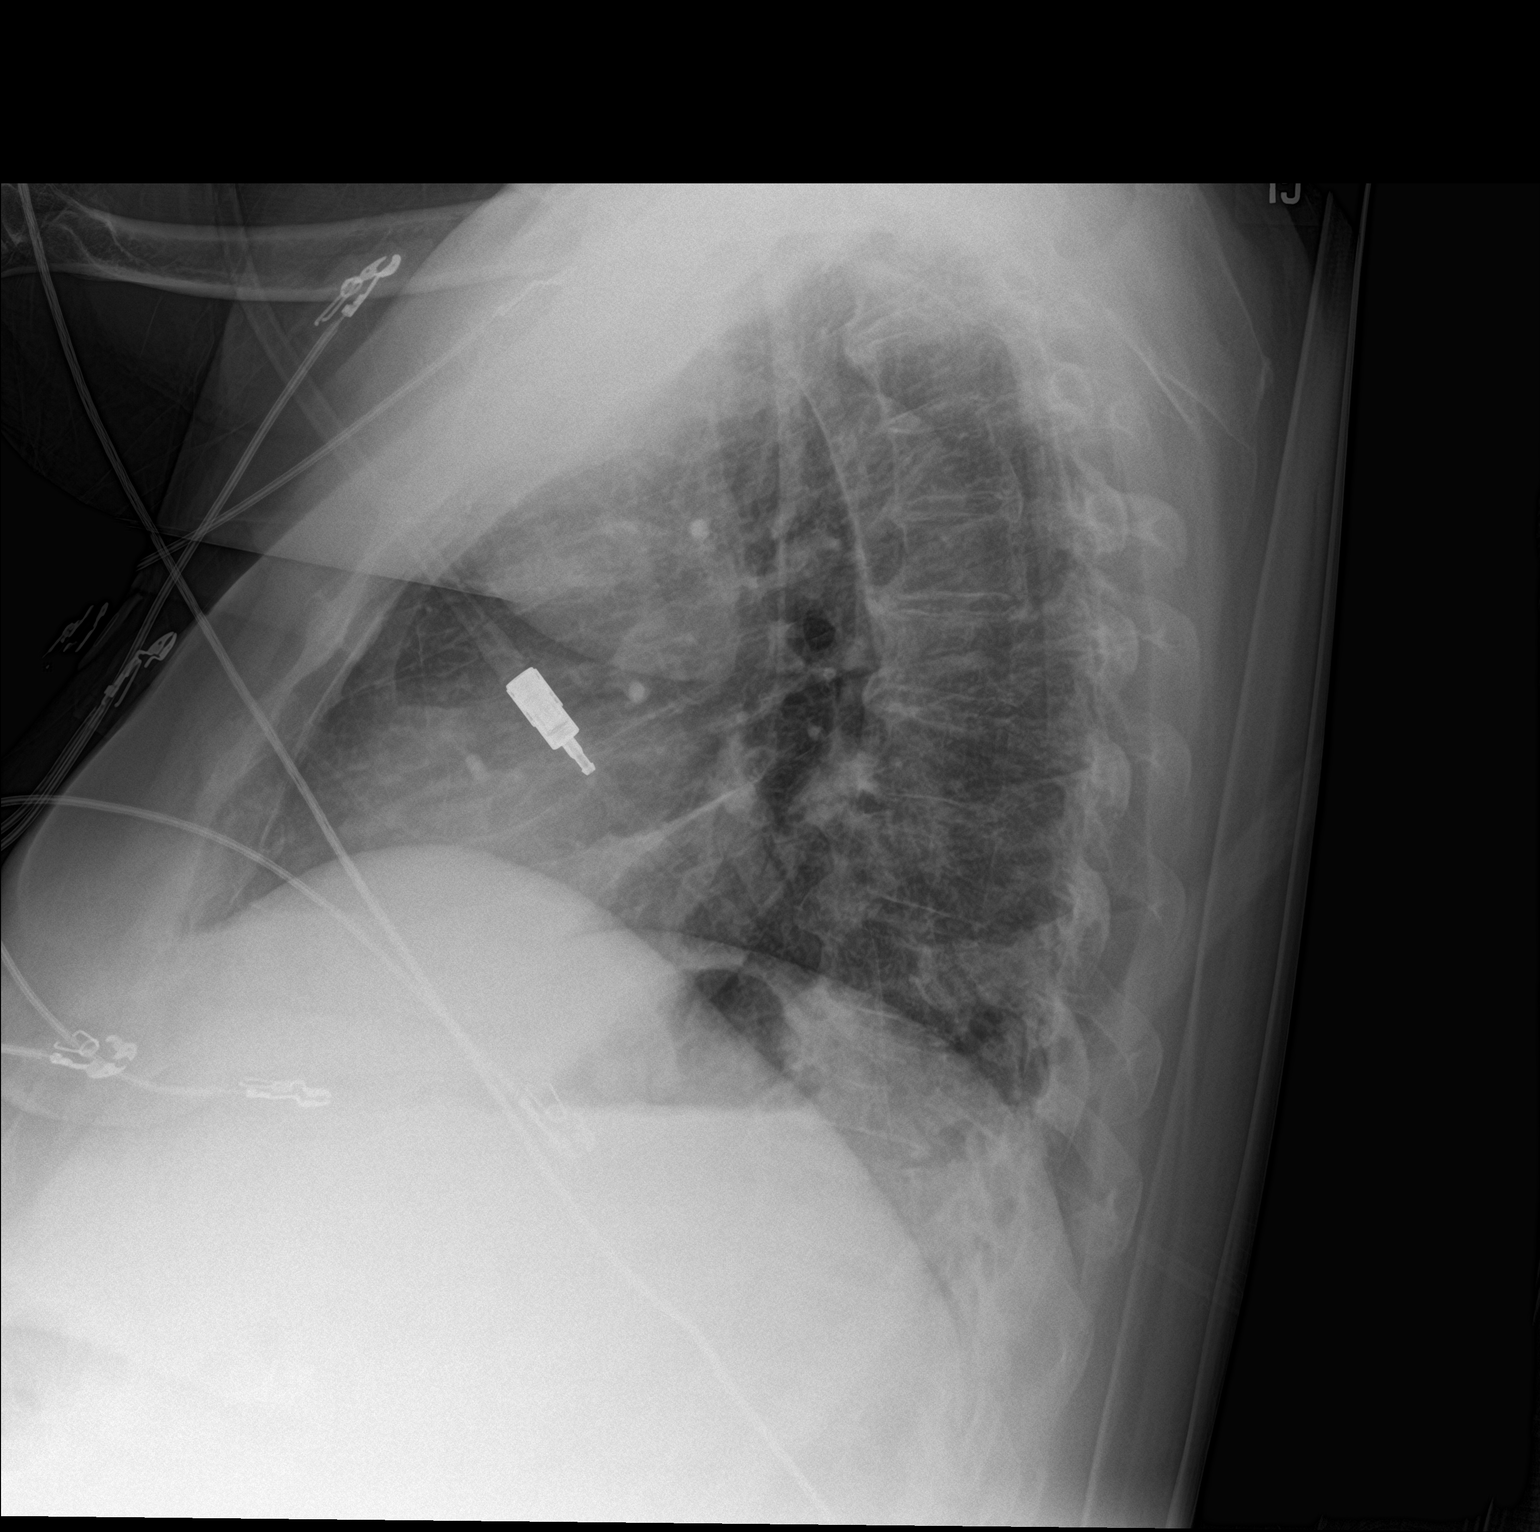

[chest ap]
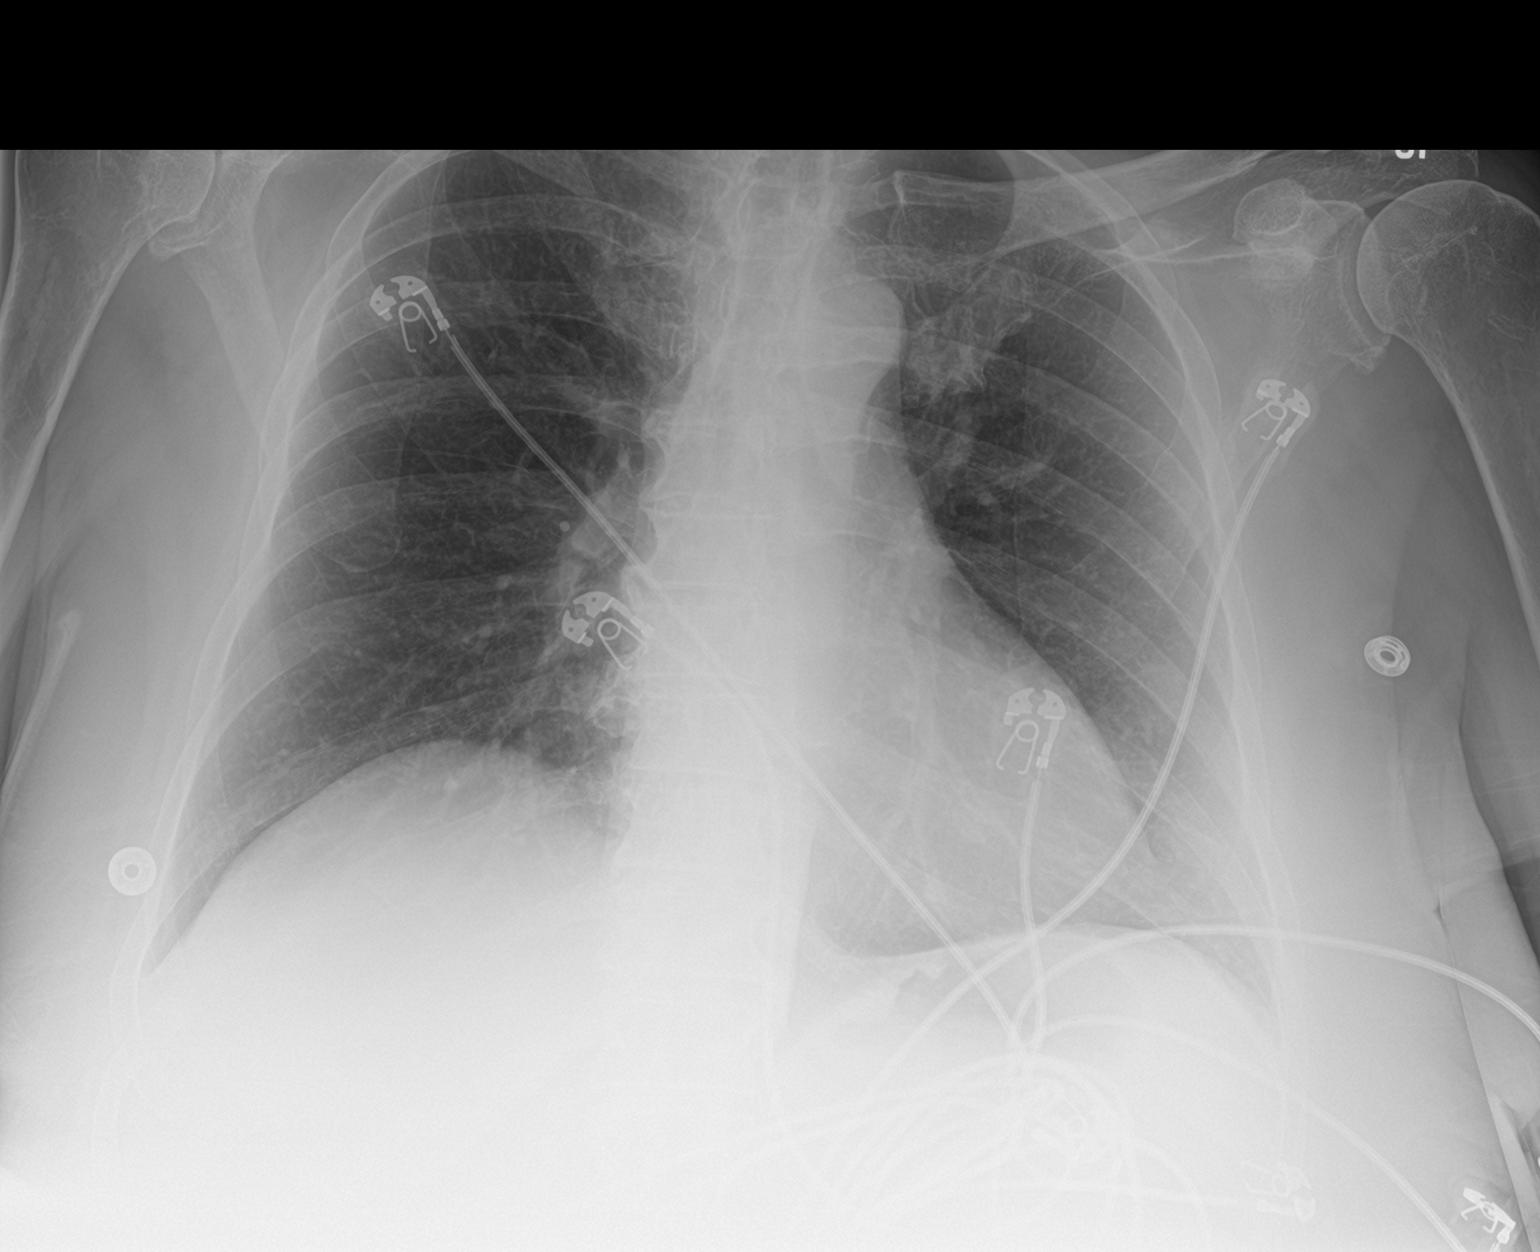

[2 of 2 positions shown; findings below may reference images not displayed]

FINDINGS: Cardiomediastinal silhouette unchanged in size and contour. No
evidence of central vascular congestion. No pneumothorax or pleural
effusion. No confluent airspace disease. Coarsened interstitial
markings.

No displaced fracture
IMPRESSION: Negative for acute cardiopulmonary disease

## 2019-10-10 IMAGING — CT CT ABD-PELV W/O CM
2 of 4 series · 17 of 46 positions shown, 19 images · non-contrast
Comparison: CT 03/27/2008

CLINICAL DATA: Abdominal distension with nausea and vomiting

EXAM:
CT ABDOMEN AND PELVIS WITHOUT CONTRAST
TECHNIQUE: Multidetector CT imaging of the abdomen and pelvis was performed
following the standard protocol without IV contrast.

[Series 3: a/p w/o 5mm · axial · non-contrast · 0.96mm/px · z∈[+731,+1181]mm · 14 of 98 slices shown, 16 images]
[im 4/98  soft-tissue]
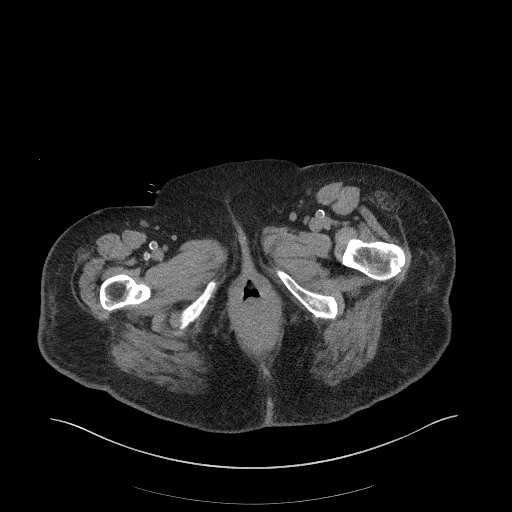
[im 4/98  bone]
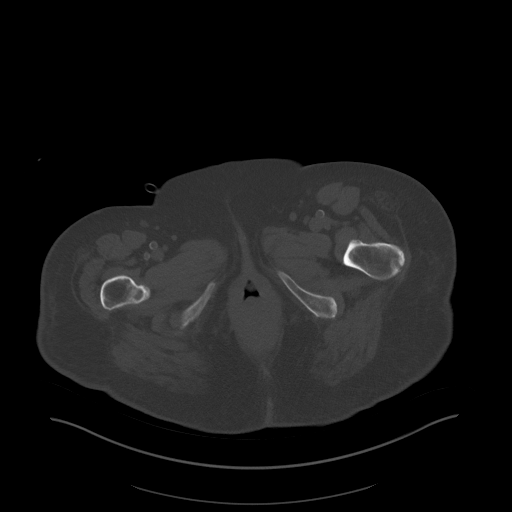
[im 12/98  soft-tissue]
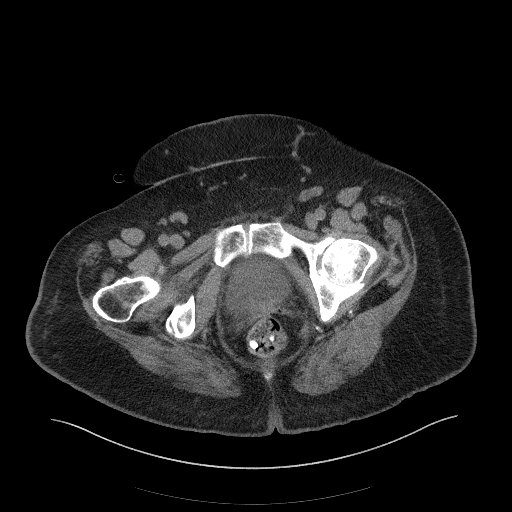
[im 20/98  soft-tissue]
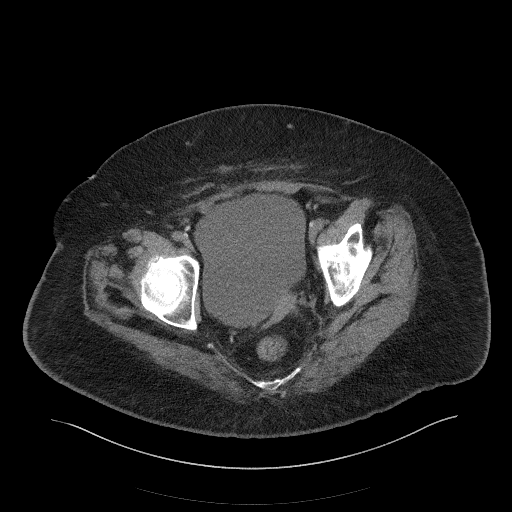
[im 28/98  soft-tissue]
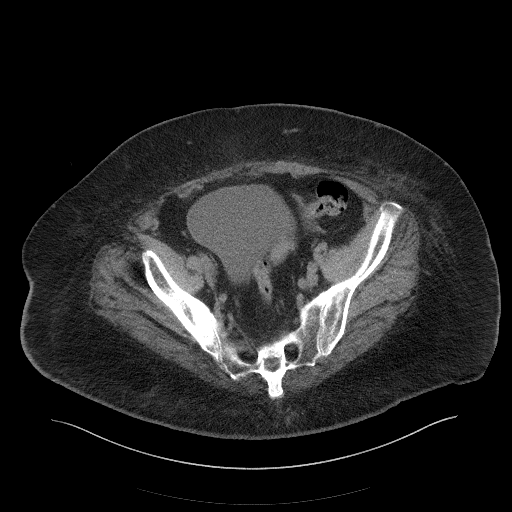
[im 32/98  soft-tissue]
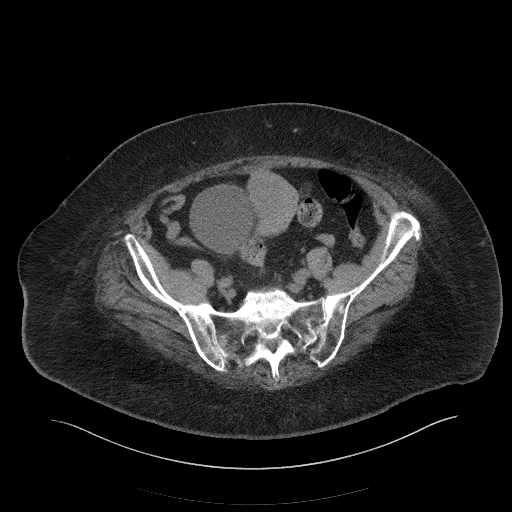
[im 39/98  soft-tissue]
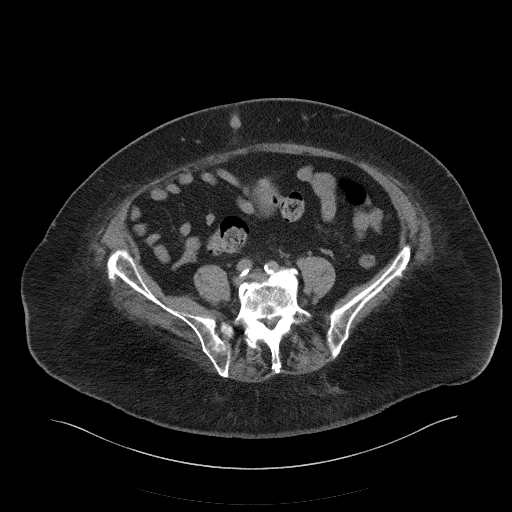
[im 47/98  soft-tissue]
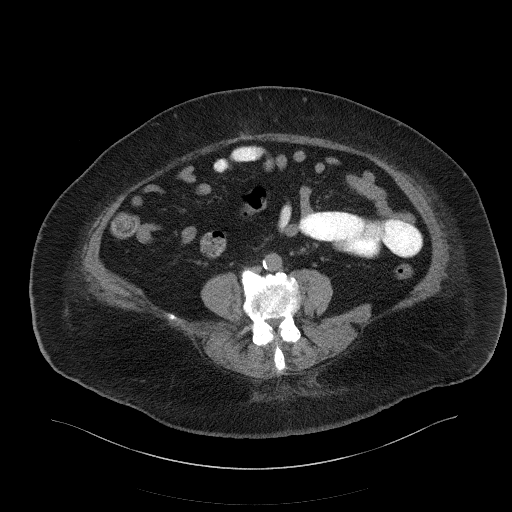
[im 51/98  soft-tissue]
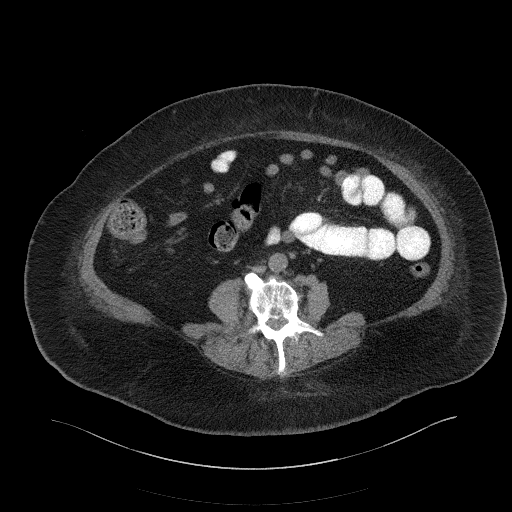
[im 59/98  soft-tissue]
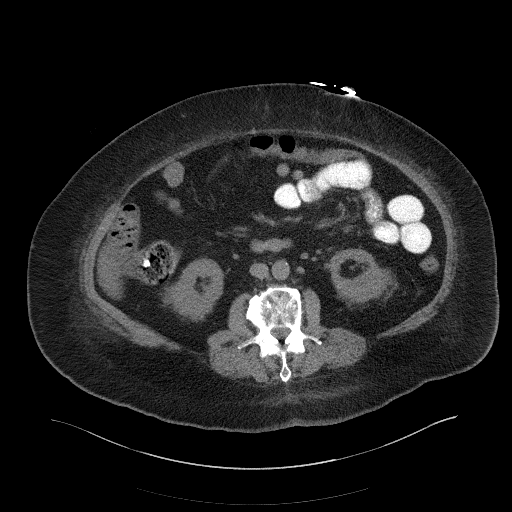
[im 59/98  bone]
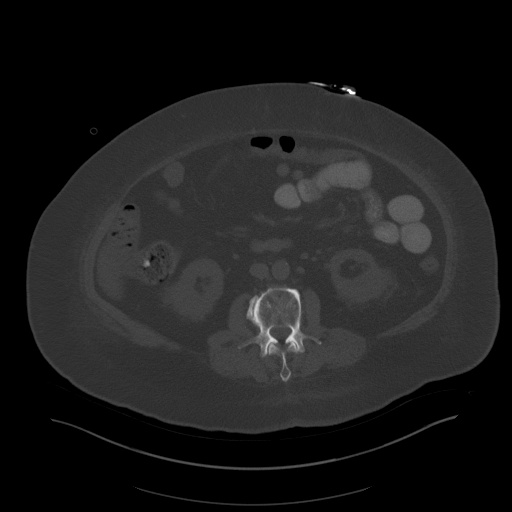
[im 66/98  soft-tissue]
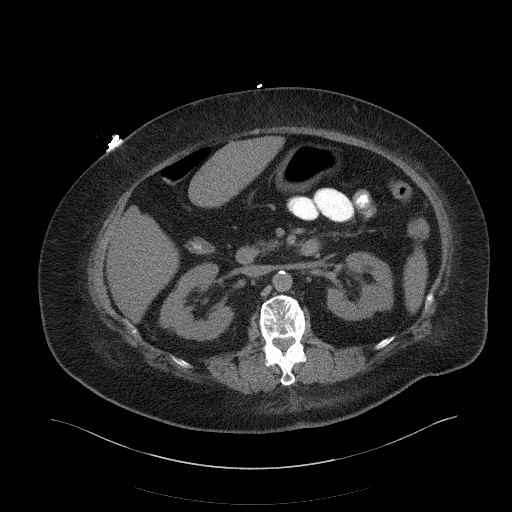
[im 74/98  soft-tissue]
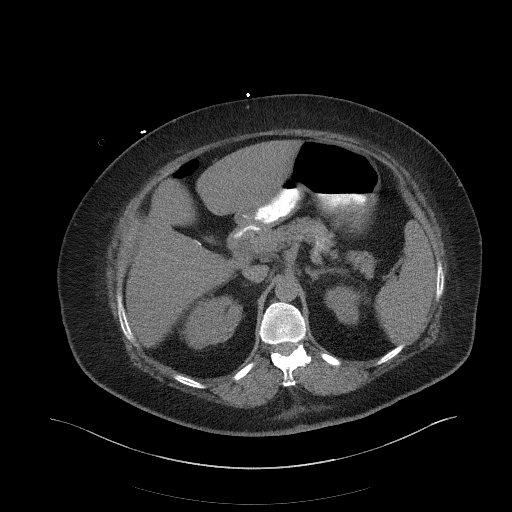
[im 78/98  soft-tissue]
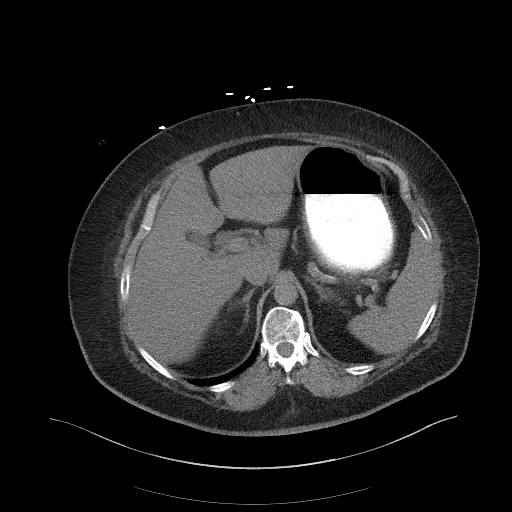
[im 86/98  soft-tissue]
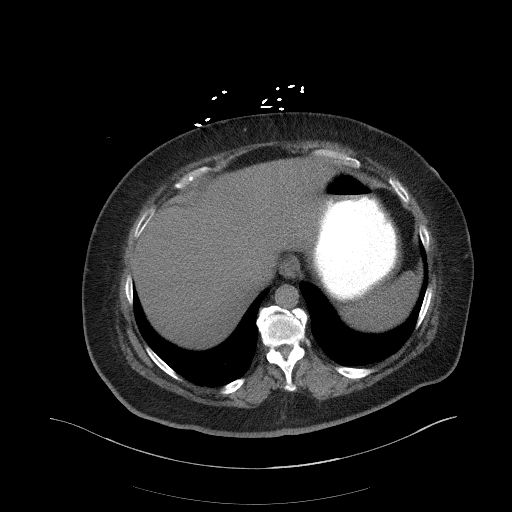
[im 94/98  soft-tissue]
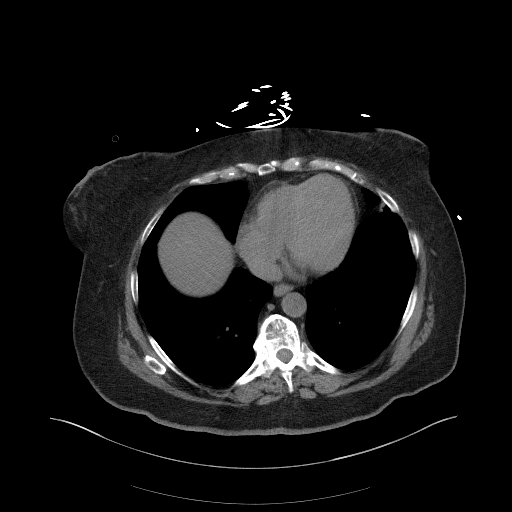

[Series 6: a/p w/o cor · coronal · non-contrast · 0.97mm/px · 3 of 172 slices shown]
[im 58/172  soft-tissue]
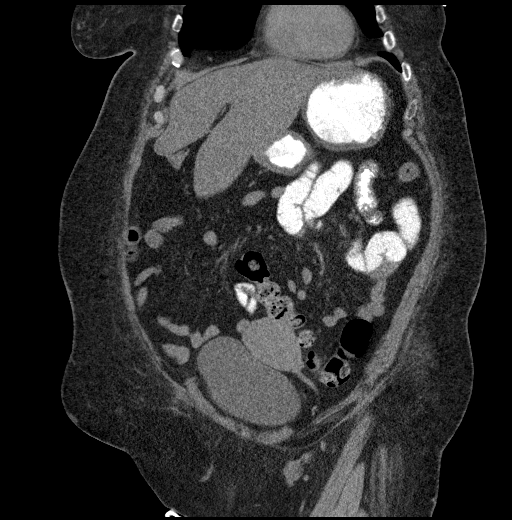
[im 77/172  soft-tissue]
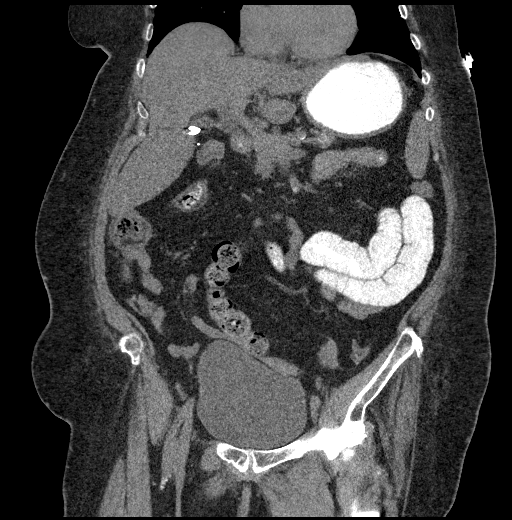
[im 96/172  soft-tissue]
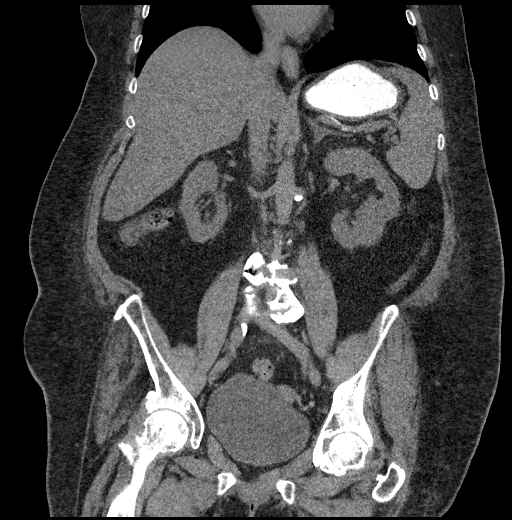

[17 of 46 positions shown; findings below may reference images not displayed]

FINDINGS: Lower chest: Lung bases are clear. No acute consolidation or
effusion. Heart size within normal limits.

Hepatobiliary: No focal liver abnormality is seen. Status post
cholecystectomy. No biliary dilatation.

Pancreas: Unremarkable. No pancreatic ductal dilatation or
surrounding inflammatory changes.

Spleen: Normal in size without focal abnormality.

Adrenals/Urinary Tract: Adrenal glands are normal. No
hydronephrosis. Punctate stone mid to upper pole right kidney. 6 mm
stone lower pole left kidney. Marked urinary bladder distention.

Stomach/Bowel: Stomach is within normal limits. Appendix appears
normal. No evidence of bowel wall thickening, distention, or
inflammatory changes.

Vascular/Lymphatic: Nonaneurysmal aorta. Moderate aortic
atherosclerosis. No aneurysm. No significantly enlarged lymph nodes

Reproductive: Small amount of air in the vagina. No adnexal mass.
Slightly high position of the uterus.

Other: Negative for free air or free fluid.

Musculoskeletal: No acute or significant osseous findings.
IMPRESSION: 1. Negative for bowel obstruction or bowel wall thickening. No acute
intra-abdominal or pelvic abnormality.
2. Small stones within the kidneys without obstruction.

## 2020-03-14 IMAGING — DX PORTABLE CHEST - 1 VIEW
1 series · 1 of 1 positions shown · non-contrast
Comparison: 08/15/2018 chest radiograph

CLINICAL DATA: 62 y/o  F; post CPR and intubation.

EXAM:
PORTABLE CHEST 1 VIEW

[chest]
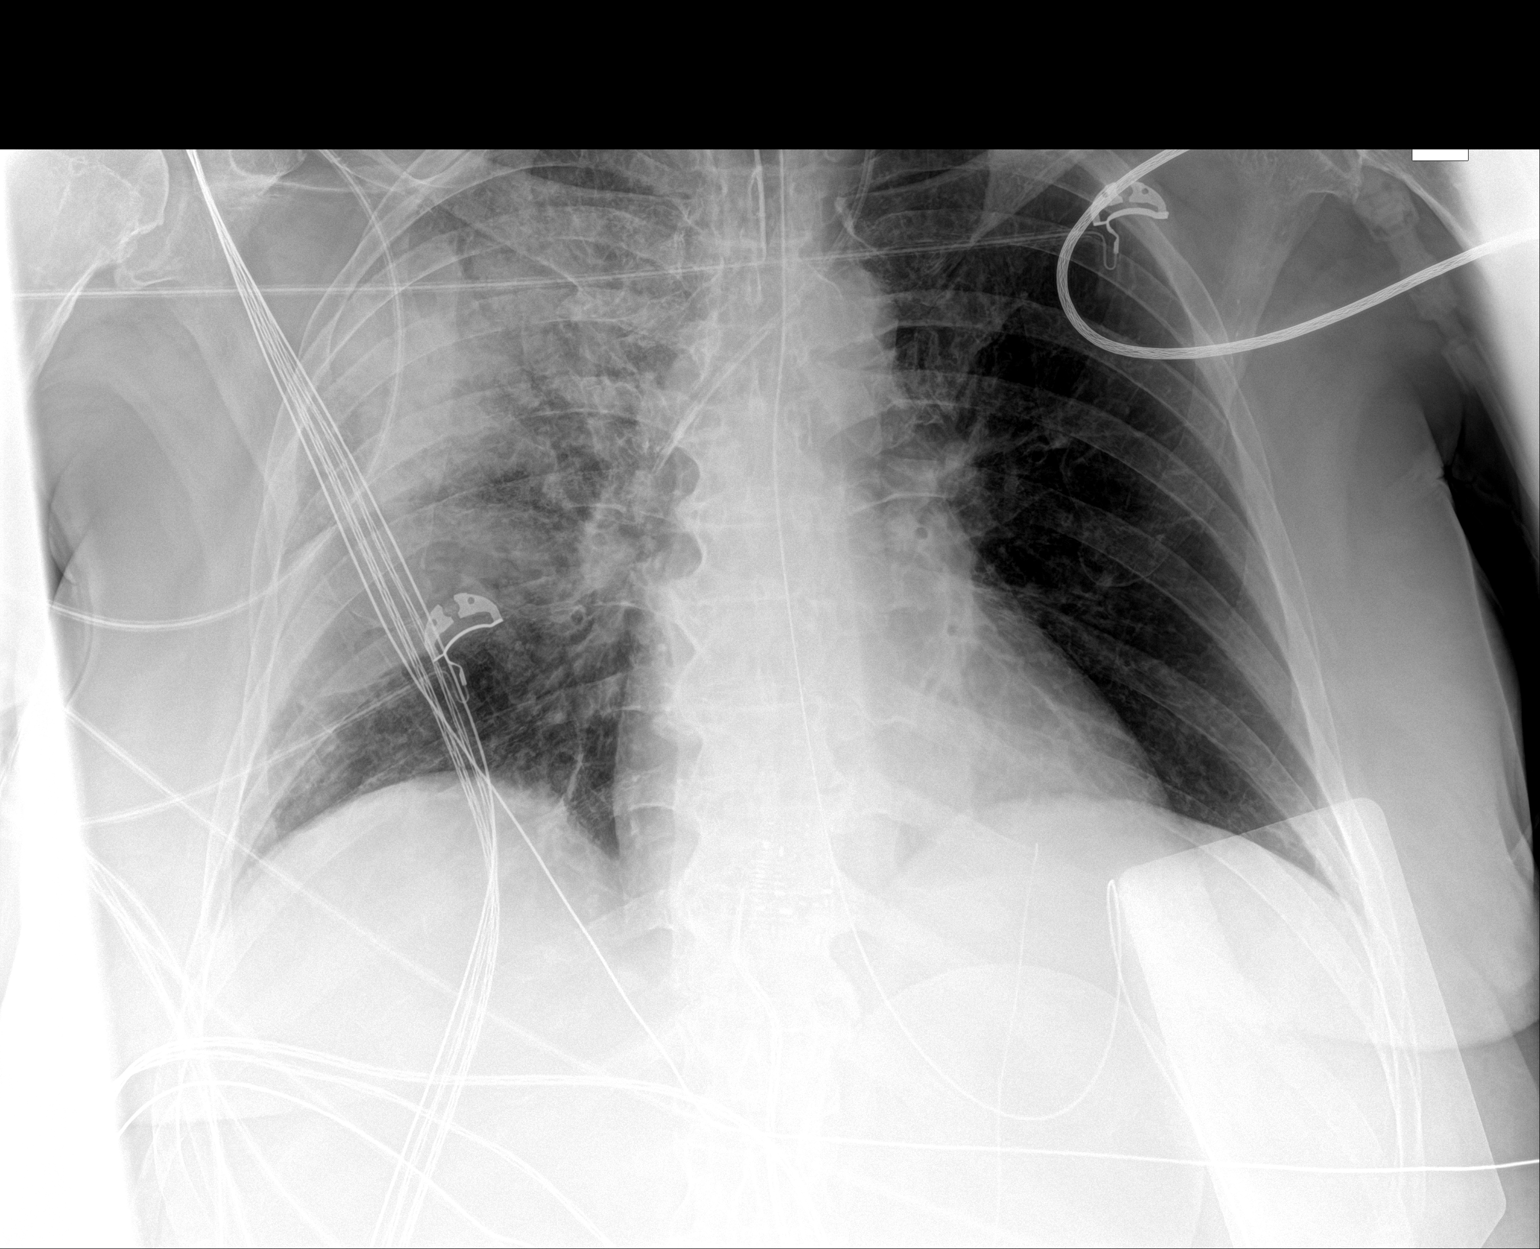

[1 of 1 positions shown; findings below may reference images not displayed]

FINDINGS: Endotracheal tube tip projects 3.7 cm above the carina. Enteric tube
tip extends below the field of view into the abdomen. Left central
venous catheter tip projects over the mid SVC. Large right mid and
upper lung zone consolidation. No pleural effusion or pneumothorax.
No acute osseous abnormality identified.
IMPRESSION: 1. Endotracheal tube tip projects 3.7 cm above the carina. Enteric
tube tip extends below the field of view into the abdomen. Left
central venous catheter tip projects over mid SVC.
2. Large right mid and upper lung zone consolidation.
